# Patient Record
Sex: Male | Born: 1950
Health system: Southern US, Community
[De-identification: ages and names within clinical notes are randomized; demographics above are authoritative.]

## PROBLEM LIST (undated history)

## (undated) DIAGNOSIS — E669 Obesity, unspecified: Secondary | ICD-10-CM

## (undated) DIAGNOSIS — E785 Hyperlipidemia, unspecified: Secondary | ICD-10-CM

## (undated) DIAGNOSIS — K649 Unspecified hemorrhoids: Secondary | ICD-10-CM

## (undated) DIAGNOSIS — K219 Gastro-esophageal reflux disease without esophagitis: Secondary | ICD-10-CM

## (undated) DIAGNOSIS — C61 Malignant neoplasm of prostate: Secondary | ICD-10-CM

## (undated) DIAGNOSIS — E559 Vitamin D deficiency, unspecified: Secondary | ICD-10-CM

## (undated) DIAGNOSIS — I10 Essential (primary) hypertension: Secondary | ICD-10-CM

## (undated) DIAGNOSIS — I739 Peripheral vascular disease, unspecified: Secondary | ICD-10-CM

## (undated) DIAGNOSIS — Z8619 Personal history of other infectious and parasitic diseases: Secondary | ICD-10-CM

## (undated) HISTORY — DX: Unspecified hemorrhoids: K64.9

## (undated) HISTORY — DX: Gastro-esophageal reflux disease without esophagitis: K21.9

## (undated) HISTORY — DX: Personal history of other infectious and parasitic diseases: Z86.19

## (undated) HISTORY — DX: Vitamin D deficiency, unspecified: E55.9

## (undated) HISTORY — DX: Essential (primary) hypertension: I10

## (undated) HISTORY — DX: Hyperlipidemia, unspecified: E78.5

## (undated) HISTORY — PX: TOTAL HIP ARTHROPLASTY: SHX124

## (undated) HISTORY — DX: Obesity, unspecified: E66.9

## (undated) HISTORY — PX: HEMORRHOID SURGERY: SHX153

## (undated) HISTORY — PX: TONSILLECTOMY: SUR1361

---

## 2012-07-31 ENCOUNTER — Encounter: Payer: Self-pay | Admitting: Gastroenterology

## 2012-08-08 ENCOUNTER — Encounter: Payer: Self-pay | Admitting: Gastroenterology

## 2012-08-08 ENCOUNTER — Ambulatory Visit (INDEPENDENT_AMBULATORY_CARE_PROVIDER_SITE_OTHER): Payer: BC Managed Care – PPO | Admitting: Gastroenterology

## 2012-08-08 VITALS — BP 140/60 | HR 72 | Ht 70.0 in | Wt 257.0 lb

## 2012-08-08 DIAGNOSIS — R634 Abnormal weight loss: Secondary | ICD-10-CM

## 2012-08-08 DIAGNOSIS — Z1211 Encounter for screening for malignant neoplasm of colon: Secondary | ICD-10-CM

## 2012-08-08 DIAGNOSIS — R110 Nausea: Secondary | ICD-10-CM

## 2012-08-08 MED ORDER — MOVIPREP 100 G PO SOLR: 1.0000 | Freq: Once | ORAL | Status: DC

## 2012-08-08 NOTE — Patient Instructions (Addendum)
You will be set up for an upper endoscopy for nausea, weight loss. You will be set up for a colonoscopy for routine colon cancer screening (LEC, moderate sedation). For now, continue sucralfate.                                              We are excited to introduce MyChart, a new best-in-class service that provides you online access to important information in your electronic medical record. We want to make it easier for you to view your health information - all in one secure location - when and where you need it. We expect MyChart will enhance the quality of care and service we provide.  When you register for MyChart, you can:    View your test results.    Request appointments and receive appointment reminders via email.    Request medication renewals.    View your medical history, allergies, medications and immunizations.    Communicate with your physician's office through a password-protected site.    Conveniently print information such as your medication lists.  To find out if MyChart is right for you, please talk to a member of our clinical staff today. We will gladly answer your questions about this free health and wellness tool.  If you are age 55 or older and want a member of your family to have access to your record, you must provide written consent by completing a proxy form available at our office. Please speak to our clinical staff about guidelines regarding accounts for patients younger than age 33.  As you activate your MyChart account and need any technical assistance, please call the MyChart technical support line at [phone]) or email your question to [email or web address]. If you email your question(s), please include your name, a return phone number and the best time to reach you.  If you have non-urgent health-related questions, you can send a message to our office through MyChart at [email or web address]. If you have a medical emergency, call  [phone].  Thank you for using MyChart as your new health and wellness resource!   MyChart licensed from [employer],  [id number]. Patents Pending.

## 2012-08-08 NOTE — Progress Notes (Signed)
HPI: This is a    very pleasant 62 year old man whom I am meeting for the first time today.  Has lost 20 pounds in past several weeks.   This "goes back 50 years."  During times of terrible stress, tragedy he has a "nervous."  Episode of GI upset with death of father.  This past fall he was treated for H. Pylori.  Since January has been very stressed about ? Retirement.    3 weeks ago woke up very nauseas, did not vomit.  Went to PCP and was told to stop omeprazole and instead start protonix once daily and reglan tid. He stayed nauseas for 2 weeks.  Last week he stopped PPI, reglan and started carafate after all meals. This change has really helped and he has started eating normally again.  No abd pains.  20 pounds weight loss.  Vomited liquid, phlegm twice only.    Pretty rare NSAIDs  No dysphagia.  Never had EGD or colonoscopy. No FH of colon or gastric cancers.    Review of systems: Pertinent positive and negative review of systems were noted in the above HPI section. Complete review of systems was performed and was otherwise normal.    Past Medical History  Diagnosis Date  . HTN (hypertension)   . Hyperlipemia     pt denies 08/08/12  . GERD (gastroesophageal reflux disease)   . Obesity   . History of Helicobacter pylori infection   . Vitamin D deficiency   . Hemorrhoids     Past Surgical History  Procedure Laterality Date  . Hemorrhoid surgery      age 69  . Tonsillectomy      Current Outpatient Prescriptions  Medication Sig Dispense Refill  . bisoprolol-hydrochlorothiazide (ZIAC) 2.5-6.25 MG per tablet Take 1 tablet by mouth daily.      Marland Kitchen losartan (COZAAR) 100 MG tablet Take 50 mg by mouth daily.      . sucralfate (CARAFATE) 1 G tablet Take 1 g by mouth 3 (three) times daily after meals.       No current facility-administered medications for this visit.    Allergies as of 08/08/2012  . (No Known Allergies)    Family History  Problem Relation Age of  Onset  . Diabetes Father   . Heart disease Father     History   Social History  . Marital Status: Married    Spouse Name: N/A    Number of Children: 1  . Years of Education: N/A   Occupational History  . Technical brewer    Social History Main Topics  . Smoking status: Never Smoker   . Smokeless tobacco: Never Used  . Alcohol Use: No  . Drug Use: No  . Sexually Active: Not on file   Other Topics Concern  . Not on file   Social History Narrative  . No narrative on file       Physical Exam: Ht 5\' 10"  (1.778 m)  Wt 257 lb (116.574 kg)  BMI 36.88 kg/m2 Constitutional: generally well-appearing Psychiatric: alert and oriented x3 Eyes: extraocular movements intact Mouth: oral pharynx moist, no lesions Neck: supple no lymphadenopathy Cardiovascular: heart regular rate and rhythm Lungs: clear to auscultation bilaterally Abdomen: soft, nontender, nondistended, no obvious ascites, no peritoneal signs, normal bowel sounds Extremities: no lower extremity edema bilaterally Skin: no lesions on visible extremities    Assessment and plan: 62 y.o. male with  several weeks of nausea, anorexia, weight loss  He had H.  pylori infection in the past and perhaps that has not gone away. Perhaps this is just GERD, acid related. He does not take NSAIDs. I think EGD is the first step in evaluating his symptoms. He has agreed to same time colonoscopy for routine colon cancer screening. He is feeling much better since starting sucralfate he will stay on that for now. His symptoms have been pretty significant however and if he is not adequately explained and I think out proceed with further testing such as imaging tests.  I did not mention above that he did have recent CBC showed a very slightly elevated white count at 11,000, normal hemoglobin, normal platelets. His complete metabolic profile is essentially normal.

## 2012-08-18 ENCOUNTER — Encounter: Payer: BC Managed Care – PPO | Admitting: Gastroenterology

## 2012-10-10 ENCOUNTER — Encounter: Payer: BC Managed Care – PPO | Admitting: Gastroenterology

## 2012-10-29 ENCOUNTER — Other Ambulatory Visit: Payer: Self-pay | Admitting: Internal Medicine

## 2012-10-29 DIAGNOSIS — R109 Unspecified abdominal pain: Secondary | ICD-10-CM

## 2012-10-31 ENCOUNTER — Ambulatory Visit
Admission: RE | Admit: 2012-10-31 | Discharge: 2012-10-31 | Disposition: A | Discharge status: Home / Self Care | Payer: BC Managed Care – PPO | Source: Ambulatory Visit | Attending: Internal Medicine | Admitting: Internal Medicine

## 2012-10-31 DIAGNOSIS — R109 Unspecified abdominal pain: Secondary | ICD-10-CM

## 2012-10-31 MED ORDER — IOHEXOL 300 MG/ML  SOLN
125.0000 mL | Freq: Once | INTRAMUSCULAR | Status: AC | PRN
  Administered 2012-10-31: 125 mL via INTRAVENOUS

## 2012-11-03 DIAGNOSIS — I739 Peripheral vascular disease, unspecified: Secondary | ICD-10-CM

## 2012-11-03 HISTORY — DX: Peripheral vascular disease, unspecified: I73.9

## 2012-11-20 ENCOUNTER — Ambulatory Visit
Admission: RE | Admit: 2012-11-20 | Discharge: 2012-11-20 | Disposition: A | Discharge status: Home / Self Care | Payer: BC Managed Care – PPO | Source: Ambulatory Visit | Attending: Internal Medicine | Admitting: Internal Medicine

## 2012-11-20 ENCOUNTER — Other Ambulatory Visit: Payer: Self-pay | Admitting: Internal Medicine

## 2012-11-20 DIAGNOSIS — M79605 Pain in left leg: Secondary | ICD-10-CM

## 2012-12-12 ENCOUNTER — Encounter: Payer: Self-pay | Admitting: Physician Assistant

## 2012-12-15 ENCOUNTER — Other Ambulatory Visit: Payer: Self-pay | Admitting: Urology

## 2012-12-29 ENCOUNTER — Encounter (HOSPITAL_COMMUNITY): Payer: Self-pay | Admitting: Pharmacy Technician

## 2012-12-30 NOTE — Patient Instructions (Addendum)
20 Kiyoshi Schaab  12/30/2012   Your procedure is scheduled on:  01/02/13  Report to [facility] at   0530    AM.  Call this number if you have problems the morning of surgery: [phone]       Remember:   Do not eat food  Or drink :After Midnight. Thursday NIGHT   Take these medicines the morning of surgery with A SIP OF WATER: OMEPRAZOLE   .  Contacts, dentures or partial plates can not be worn to surgery  Leave suitcase in the car. After surgery it may be brought to your room.  For patients admitted to the hospital, checkout time is 11:00 AM day of  discharge.             SPECIAL INSTRUCTIONS- SEE  PREPARING FOR SURGERY INSTRUCTION SHEET-     DO NOT WEAR JEWELRY, LOTIONS, POWDERS, OR PERFUMES.  WOMEN-- DO NOT SHAVE LEGS OR UNDERARMS FOR 12 HOURS BEFORE SHOWERS. MEN MAY SHAVE FACE.  Patients discharged the day of surgery will not be allowed to drive home. IF going home the day of surgery, you must have a driver and someone to stay with you for the first 24 hours  Name and phone number of your driver:    Wynona Canes   wife                                                                      Deliah Goody  PST [phone]                 FAILURE TO FOLLOW THESE INSTRUCTIONS MAY RESULT IN  CANCELLATION   OF YOUR SURGERY                                                  Patient Signature _____________________________

## 2012-12-31 ENCOUNTER — Ambulatory Visit (HOSPITAL_COMMUNITY)
Admission: RE | Admit: 2012-12-31 | Discharge: 2012-12-31 | Disposition: A | Discharge status: Home / Self Care | Payer: BC Managed Care – PPO | Source: Ambulatory Visit | Attending: Urology | Admitting: Urology

## 2012-12-31 ENCOUNTER — Encounter (HOSPITAL_COMMUNITY): Payer: Self-pay

## 2012-12-31 ENCOUNTER — Encounter (HOSPITAL_COMMUNITY)
Admission: RE | Admit: 2012-12-31 | Discharge: 2012-12-31 | Disposition: A | Discharge status: Home / Self Care | Payer: BC Managed Care – PPO | Source: Ambulatory Visit | Attending: Orthopaedic Surgery | Admitting: Orthopaedic Surgery

## 2012-12-31 DIAGNOSIS — Z01812 Encounter for preprocedural laboratory examination: Secondary | ICD-10-CM | POA: Insufficient documentation

## 2012-12-31 DIAGNOSIS — N2889 Other specified disorders of kidney and ureter: Secondary | ICD-10-CM | POA: Insufficient documentation

## 2012-12-31 DIAGNOSIS — Z01818 Encounter for other preprocedural examination: Secondary | ICD-10-CM | POA: Insufficient documentation

## 2012-12-31 HISTORY — DX: Peripheral vascular disease, unspecified: I73.9

## 2012-12-31 LAB — BASIC METABOLIC PANEL
Calcium: 9.9 mg/dL (ref 8.4–10.5)
Creatinine, Ser: 1.01 mg/dL (ref 0.50–1.35)
GFR calc Af Amer: 89 mL/min — ABNORMAL LOW (ref >90)
Sodium: 134 mEq/L — ABNORMAL LOW (ref 135–145)

## 2012-12-31 LAB — CBC
MCH: 31 pg (ref 26.0–34.0)
MCV: 90.4 fL (ref 78.0–100.0)
Platelets: 231 10*3/uL (ref 150–400)
RDW: 13.6 % (ref 11.5–15.5)
WBC: 7.5 10*3/uL (ref 4.0–10.5)

## 2013-01-01 NOTE — Anesthesia Preprocedure Evaluation (Addendum)
Anesthesia Evaluation  Patient identified by MRN, date of birth, ID band Patient awake    Reviewed: Allergy & Precautions, H&P , NPO status , Patient's Chart, lab work & pertinent test results  Airway Mallampati: II TM Distance: >3 FB Neck ROM: Full    Dental  (+) Teeth Intact and Dental Advisory Given   Pulmonary neg pulmonary ROS,  breath sounds clear to auscultation  Pulmonary exam normal       Cardiovascular hypertension, Pt. on medications + Peripheral Vascular Disease and DVT Rhythm:Regular Rate:Normal     Neuro/Psych negative neurological ROS  negative psych ROS   GI/Hepatic Neg liver ROS, GERD-  Medicated,  Endo/Other  negative endocrine ROS  Renal/GU negative Renal ROS  negative genitourinary   Musculoskeletal negative musculoskeletal ROS (+)   Abdominal   Peds  Hematology negative hematology ROS (+)   Anesthesia Other Findings   Reproductive/Obstetrics                          Anesthesia Physical Anesthesia Plan  ASA: II  Anesthesia Plan: General   Post-op Pain Management:    Induction: Intravenous  Airway Management Planned: LMA  Additional Equipment:   Intra-op Plan:   Post-operative Plan: Extubation in OR  Informed Consent: I have reviewed the patients History and Physical, chart, labs and discussed the procedure including the risks, benefits and alternatives for the proposed anesthesia with the patient or authorized representative who has indicated his/her understanding and acceptance.   Dental advisory given  Plan Discussed with: CRNA  Anesthesia Plan Comments:         Anesthesia Quick Evaluation

## 2013-01-02 ENCOUNTER — Ambulatory Visit (HOSPITAL_COMMUNITY): Payer: BC Managed Care – PPO | Admitting: Anesthesiology

## 2013-01-02 ENCOUNTER — Encounter (HOSPITAL_COMMUNITY): Payer: Self-pay | Admitting: *Deleted

## 2013-01-02 ENCOUNTER — Encounter (HOSPITAL_COMMUNITY)
Admission: RE | Disposition: A | Discharge status: Home / Self Care | Payer: Self-pay | Source: Ambulatory Visit | Attending: Urology

## 2013-01-02 ENCOUNTER — Ambulatory Visit (HOSPITAL_COMMUNITY)
Admission: RE | Admit: 2013-01-02 | Discharge: 2013-01-02 | Disposition: A | Discharge status: Home / Self Care | Payer: BC Managed Care – PPO | Source: Ambulatory Visit | Attending: Urology | Admitting: Urology

## 2013-01-02 ENCOUNTER — Encounter (HOSPITAL_COMMUNITY): Payer: BC Managed Care – PPO | Admitting: Anesthesiology

## 2013-01-02 DIAGNOSIS — M129 Arthropathy, unspecified: Secondary | ICD-10-CM | POA: Insufficient documentation

## 2013-01-02 DIAGNOSIS — Z86718 Personal history of other venous thrombosis and embolism: Secondary | ICD-10-CM | POA: Insufficient documentation

## 2013-01-02 DIAGNOSIS — R9389 Abnormal findings on diagnostic imaging of other specified body structures: Secondary | ICD-10-CM | POA: Insufficient documentation

## 2013-01-02 DIAGNOSIS — N289 Disorder of kidney and ureter, unspecified: Secondary | ICD-10-CM | POA: Insufficient documentation

## 2013-01-02 DIAGNOSIS — K219 Gastro-esophageal reflux disease without esophagitis: Secondary | ICD-10-CM | POA: Insufficient documentation

## 2013-01-02 DIAGNOSIS — N2889 Other specified disorders of kidney and ureter: Secondary | ICD-10-CM | POA: Insufficient documentation

## 2013-01-02 DIAGNOSIS — R972 Elevated prostate specific antigen [PSA]: Secondary | ICD-10-CM | POA: Insufficient documentation

## 2013-01-02 DIAGNOSIS — I10 Essential (primary) hypertension: Secondary | ICD-10-CM | POA: Insufficient documentation

## 2013-01-02 DIAGNOSIS — R3129 Other microscopic hematuria: Secondary | ICD-10-CM | POA: Insufficient documentation

## 2013-01-02 DIAGNOSIS — N200 Calculus of kidney: Secondary | ICD-10-CM | POA: Insufficient documentation

## 2013-01-02 HISTORY — PX: CYSTOSCOPY/RETROGRADE/URETEROSCOPY: SHX5316

## 2013-01-02 SURGERY — CYSTOSCOPY/RETROGRADE/URETEROSCOPY
Anesthesia: General | Laterality: Left | Wound class: Clean Contaminated

## 2013-01-02 MED ORDER — ONDANSETRON HCL 4 MG/2ML IJ SOLN
INTRAMUSCULAR | Status: DC | PRN
  Administered 2013-01-02: 4 mg via INTRAVENOUS

## 2013-01-02 MED ORDER — CIPROFLOXACIN IN D5W 400 MG/200ML IV SOLN
INTRAVENOUS | Status: AC
  Filled 2013-01-02: qty 200

## 2013-01-02 MED ORDER — PROMETHAZINE HCL 25 MG/ML IJ SOLN: 6.2500 mg | INTRAMUSCULAR | Status: DC | PRN

## 2013-01-02 MED ORDER — FENTANYL CITRATE 0.05 MG/ML IJ SOLN
INTRAMUSCULAR | Status: DC | PRN
  Administered 2013-01-02 (×2): 50 ug via INTRAVENOUS
  Administered 2013-01-02: 100 ug via INTRAVENOUS
  Administered 2013-01-02: 50 ug via INTRAVENOUS

## 2013-01-02 MED ORDER — PROPOFOL 10 MG/ML IV BOLUS
INTRAVENOUS | Status: DC | PRN
  Administered 2013-01-02: 200 mg via INTRAVENOUS

## 2013-01-02 MED ORDER — CIPROFLOXACIN IN D5W 400 MG/200ML IV SOLN
400.0000 mg | INTRAVENOUS | Status: AC
  Administered 2013-01-02: 400 mg via INTRAVENOUS

## 2013-01-02 MED ORDER — MIDAZOLAM HCL 5 MG/5ML IJ SOLN
INTRAMUSCULAR | Status: DC | PRN
  Administered 2013-01-02: 2 mg via INTRAVENOUS

## 2013-01-02 MED ORDER — ACETAMINOPHEN-CODEINE #3 300-30 MG PO TABS: 1.0000 | ORAL_TABLET | ORAL | Status: DC | PRN

## 2013-01-02 MED ORDER — IOHEXOL 300 MG/ML  SOLN
INTRAMUSCULAR | Status: DC | PRN
  Administered 2013-01-02: 20 mL

## 2013-01-02 MED ORDER — BELLADONNA ALKALOIDS-OPIUM 16.2-60 MG RE SUPP
RECTAL | Status: AC
  Filled 2013-01-02: qty 1

## 2013-01-02 MED ORDER — LACTATED RINGERS IV SOLN
INTRAVENOUS | Status: DC
  Administered 2013-01-02: 1000 mL via INTRAVENOUS

## 2013-01-02 MED ORDER — LIDOCAINE HCL 2 % EX GEL
CUTANEOUS | Status: AC
  Filled 2013-01-02: qty 10

## 2013-01-02 MED ORDER — LACTATED RINGERS IV SOLN
INTRAVENOUS | Status: DC | PRN
  Administered 2013-01-02: 07:00:00 via INTRAVENOUS

## 2013-01-02 MED ORDER — LIDOCAINE HCL 2 % EX GEL
CUTANEOUS | Status: DC | PRN
  Administered 2013-01-02: 1 via URETHRAL

## 2013-01-02 MED ORDER — LIDOCAINE HCL 1 % IJ SOLN
INTRAMUSCULAR | Status: DC | PRN
  Administered 2013-01-02: 50 mg via INTRADERMAL

## 2013-01-02 MED ORDER — CIPROFLOXACIN HCL 500 MG PO TABS: 500.0000 mg | ORAL_TABLET | Freq: Two times a day (BID) | ORAL | Status: DC

## 2013-01-02 MED ORDER — BISOPROLOL FUMARATE 10 MG PO TABS
10.0000 mg | ORAL_TABLET | Freq: Once | ORAL | Status: AC
  Administered 2013-01-02: 10 mg via ORAL
  Filled 2013-01-02: qty 1

## 2013-01-02 MED ORDER — TROSPIUM CHLORIDE ER 60 MG PO CP24: 60.0000 mg | ORAL_CAPSULE | Freq: Every day | ORAL | Status: DC

## 2013-01-02 MED ORDER — HYDROMORPHONE HCL PF 1 MG/ML IJ SOLN: 0.2500 mg | INTRAMUSCULAR | Status: DC | PRN

## 2013-01-02 MED ORDER — SODIUM CHLORIDE 0.9 % IR SOLN
Status: DC | PRN
  Administered 2013-01-02: 3000 mL via INTRAVESICAL

## 2013-01-02 SURGICAL SUPPLY — 19 items
BAG URO CATCHER STRL LF (DRAPE) ×2 IMPLANT
BENZOIN TINCTURE PRP APPL 2/3 (GAUZE/BANDAGES/DRESSINGS) ×2 IMPLANT
CATH URET 5FR 28IN CONE TIP (BALLOONS)
CATH URET 5FR 28IN OPEN ENDED (CATHETERS) ×2 IMPLANT
CATH URET 5FR 70CM CONE TIP (BALLOONS) IMPLANT
CLOTH BEACON ORANGE TIMEOUT ST (SAFETY) ×2 IMPLANT
CONT SPEC 4OZ CLIKSEAL STRL BL (MISCELLANEOUS) ×2 IMPLANT
DRAPE CAMERA CLOSED 9X96 (DRAPES) ×2 IMPLANT
DRSG TEGADERM 2-3/8X2-3/4 SM (GAUZE/BANDAGES/DRESSINGS) ×2 IMPLANT
GLOVE BIOGEL M 8.0 STRL (GLOVE) ×2 IMPLANT
GOWN PREVENTION PLUS XLARGE (GOWN DISPOSABLE) ×2 IMPLANT
GOWN STRL REIN XL XLG (GOWN DISPOSABLE) ×2 IMPLANT
GUIDEWIRE STR DUAL SENSOR (WIRE) ×4 IMPLANT
MANIFOLD NEPTUNE II (INSTRUMENTS) ×2 IMPLANT
PACK CYSTO (CUSTOM PROCEDURE TRAY) ×2 IMPLANT
STENT CONTOUR 6FRX26X.038 (STENTS) ×2 IMPLANT
SYR CONTROL 10ML LL (SYRINGE) ×2 IMPLANT
TUBING CONNECTING 10 (TUBING) ×2 IMPLANT
WIRE COONS/BENSON .038X145CM (WIRE) IMPLANT

## 2013-01-02 NOTE — H&P (Signed)
History of Present Illness  This is a 62 year old male patient referred by Dr. Lucky Cowboy, M.D. for evaluation of urothelial mass in the left lower pole.  The patient had a CT scan obtained with IV contrast approximally 3 weeks ago for diffuse abdominal pain and a history of diverticulitis.  Incidental note of the 12 mm left lower pole stone with a suspicious area in the lower pole with concern for this being an upper tract TCC.  Patient denies any gross hematuria.  Denies any weight loss, fevers or chills, night sweats.  Patient has had no flank pain.  He has a family history of kidney stones - father had a large stone that was treated. Prior to his bout with diverticulitis he was very healthy.    In addition the patient was recently diagnosed with a left lower extremity DVT for which he was started on Xarelto. Patient noted left leg was swollen after working in the yard.  Asymptomatic - occasionally he has pain when he compresses the back of his leg.  The patient also recently had a PSA test.  His PSA was 3.39,. 8% free.   One year prior the patient had a PSA of 1.22.  Patient's mom had low grade bladder TCC, died of CHF.  In addition, the patient has no family history of prostate cancer.  On 11/20/12: UA was negative for hematuria BUN 12/creatinine 0.95  On 10/31/12 the patient underwent abdomen and pelvis CT scan with contrast: I have independently reviewed these images.   Past Medical History Problems  1. History of  Arthritis V13.4 2. History of  Esophageal Reflux 530.81 3. History of  Hypertension 401.9  Surgical History Problems  1. History of  No Surgical Problems  Current Meds 1. Bisoprolol-Hydrochlorothiazide TABS; Therapy: (Recorded:24Sep2014) to 2. Losartan Potassium 100 MG Oral Tablet; Therapy: (Recorded:24Sep2014) to 3. Protonix 40 MG Intravenous Solution Reconstituted; Therapy: (Recorded:24Sep2014) to  Allergies Medication  1. No Known Drug Allergies  Family  History Problems  1. Paternal history of  Acute Myocardial Infarction V17.3 2. Maternal history of  Bladder Cancer V16.52 3. Maternal history of  Congestive Heart Failure  Social History Problems    Caffeine Use   Marital History - Currently Married   Never A Smoker Denied    History of  Alcohol Use  Review of Systems  A 12 point comprehensive review of systems was obtained and is negative unless otherwise stated in the HPI with the following additions: Indigestion/heartburn, leg swelling, and joint pain.     Vitals Vital Signs [Data Includes: Last 1 Day]  25Sep2014 02:31PM  BMI Calculated: 35.84 BSA Calculated: 2.34 Height: 5 ft 11 in Weight: 256 lb  Blood Pressure: 129 / 72 Temperature: 98.2 F Heart Rate: 78  Physical Exam Constitutional: Well nourished and well developed . No acute distress.  ENT:. The ears and nose are normal in appearance.  Neck: The appearance of the neck is normal and no neck mass is present.  Pulmonary: No respiratory distress and normal respiratory rhythm and effort.  Cardiovascular: Heart rate and rhythm are normal . No peripheral edema.  Abdomen: The abdomen is soft and nontender. No masses are palpated. No CVA tenderness. No hernias are palpable. No hepatosplenomegaly noted.  Rectal: Rectal exam demonstrates rectal tenderness, but normal sphincter tone and no masses. Estimated prostate size is 1+. Normal rectal tone, no rectal masses, prostate is smooth, symmetric and non-tender. The prostate has no nodularity and is not tender. The left seminal vesicle is  nonpalpable. The right seminal vesicle is nonpalpable. The perineum is normal on inspection.  Genitourinary: Examination of the penis demonstrates no discharge, no masses, no lesions and a normal meatus. The scrotum is without lesions. The right epididymis is palpably normal and non-tender. The left epididymis is palpably normal and non-tender. The right testis is non-tender and without masses.  The left testis is non-tender and without masses.  Lymphatics: The femoral and inguinal nodes are not enlarged or tender.  Skin: Normal skin turgor, no visible rash and no visible skin lesions.  Neuro/Psych:. Mood and affect are appropriate.    Results/Data Urine [Data Includes: Last 1 Day]   25Sep2014  COLOR YELLOW   APPEARANCE CLEAR   SPECIFIC GRAVITY 1.025   pH 5.5   GLUCOSE NEG mg/dL  BILIRUBIN NEG   KETONE NEG mg/dL  BLOOD LARGE   PROTEIN NEG mg/dL  UROBILINOGEN 0.2 mg/dL  NITRITE NEG   LEUKOCYTE ESTERASE NEG   SQUAMOUS EPITHELIAL/HPF MODERATE   WBC NONE SEEN WBC/hpf  RBC 11-20 RBC/hpf  BACTERIA NONE SEEN   CRYSTALS NONE SEEN   CASTS NONE SEEN     CT abdomen and pelvis with contrast from 11/21/12: I have personally reviewed these images which show a 12 x 12 mm stone in the left lower pole.  In addition the patient has some stranding and soft tissue thickening at the left UPJ.  No delayed images were obtained to further delineate this area.   Assessment  #1 abnormal thickening of the left UPJ #2 12 mm left lower pole kidney stone #3 rapid rise in PSA with percent free concerning for prostate cancer #4 left lower extremity DVT on Xarelto     Discussion/Summary We'll start with a diagnostic cystoscopy, left ureteral washing, left retrograde pyelogram, and possible left ureteral biopsy to rule out an upper tract TCC. At the time of the patient's preoperative evaluation for anesthesia I will also obtain a repeat PSA.  We will wait and treatment for the kidney stone until the above issues are sorted out.

## 2013-01-02 NOTE — OR Nursing (Signed)
After anesthesia induction it was noticed that the patient did not have his stocking on as previously stated. It was decided by dr. Marlou Porch to do the surgery without since the surgical time was to be short. Once surgery is over, ted hose will be applied to the patients' lower legs.

## 2013-01-02 NOTE — OR Nursing (Signed)
Patient has recent hx of DVT. i had a discussion about this with dr. Rica Mast and dr. Marlou Porch. It was decided it was safer NOT to use scd's for this patient. He does have a compression stocking on the affected leg.

## 2013-01-02 NOTE — Anesthesia Postprocedure Evaluation (Signed)
Anesthesia Post Note  Patient: Jason Patton  Procedure(s) Performed: Procedure(s) (LRB): CYSTOSCOPY/LEFT URETERAL WASHINGS/LEFT RETROGRADE PYELOGRAM/ LEFT URETEROSCOPY/URETERAL BIOPSY. BLADDER BIOPSY (Left)  Anesthesia type: General  Patient location: PACU  Post pain: Pain level controlled  Post assessment: Post-op Vital signs reviewed  Last Vitals:  Filed Vitals:   01/02/13 1001  BP: 134/86  Pulse:   Temp: 36.1 C  Resp: 12    Post vital signs: Reviewed  Level of consciousness: sedated  Complications: No apparent anesthesia complications

## 2013-01-02 NOTE — Op Note (Signed)
Preoperative diagnosis: Microscopic hematuria, left renal pelvis thickening on CT scan, left lower pole 12 mm renal stone  Postoperative diagnosis: As above, small external abnormality the left bladder trigone.  Procedure Performed: Cystoscopy, bladder biopsy and fulguration, left renal pelvis selective cytology, left retrograde pyelogram, left ureteroscopy.  Surgeon: Dr. Crist Fat, M.D.  Findings: 1. Cystoscopy revealed a small abnormality of the bladder mucosa around the left ureteral orifice, this area was biopsied. 2. No filling defects noted in the left renal pelvis, there was a filling defect noted in the lower pole consistent  with lower pole stone noted on CT scan 3. The left UPJ demonstrated no significant mucosal abnormality, and no biopsy was obtained. 4. A 26 cm x 6 French stent was placed in the left ureter under fluoroscopic guidance.  Indications: [name] is a [age] gentleman who presented to me with a left renal pelvis/UPJ thickening noted on a CT scan which was performed for diverticulitis. He was also diagnosed with a 12 millimeter left lower pole stone, and the patient had an elevated PSA 3. 5. Simultaneously the patient was also diagnosed with a left lower cavity DVT. We discussed the options for further evaluation, and I recommended the patient undergo left retrograde pyelogram left selective cytology and possible left ureteral biopsy as the initial diagnostic study. I also repeat his PSA which was 2.57. He understands the risks and benefits of the operation and is agreed proceed.  Procedure: Consent was obtained in the preoperative holding area. The patient was then brought back to the operating room placed on the table in supine position. General anesthesia was induced and endotracheal tube was inserted. He was then placed in the dorsal lithotomy position and prepped and draped in the routine sterile fashion. A timeout was held.  A 22 French 30 cystoscope was  then gently passed to the patient's urethra and into the bladder. The bladder was then emptied and the 30 lens was exchanged for the 70 lens. A 360 cystoscopic evaluation was performed with the above findings. I then reinserted the 30 lens and performed a left renal pelvis selective cytology by inserting a 5 French open-ended ureteral Pollock into the left ureteral orifice and under fluoroscopic guidance gently passed it up the ureter and into the area of the left renal pelvis. I then inserted approximately 10 cc of normal saline through the Gurley and allowed the return urine to drip into a specimen cup which was then sent for cytology. We then performed a retrograde pyelogram with the above findings. I then placed a 0.38 sensor wire through the open-ended ureteral Pollock and into the renal pelvis. I then emptied the bladder and removed the cystoscope. We then passed the semirigid ureteroscope gently to the patient's urethra and into the bladder under visual guidance. Using a second 0.38 sensor wire I was able to L Rd. the scope through the 2 wires and into the left ureteral orifice. I then gently navigated the left ureter under visual guidance up to the renal pelvis with the rigid scope, with the above findings. I then passed a second 0.38 wire through the semirigid ureteroscope slowly backed ureteroscope of the ureter over the wire. I then inserted the flexible ureteroscope over the wire and into the left renal pelvis under visual guidance. Then aspirated the murky urine from the left renal pelvis and filling fresh normal saline was able to visualize the entire collecting system. There were no significant abnormalities except for the large stone noted in  the left lower pole. I then attempted to take a biopsy of the left renal pelvis/UPJ area although this proved difficult because there were no raised or suspicious areas. The biopsy specimens turned out to be in significant, and were not sent to  pathology.  Having removed the flexible ureteroscope, I then passed the open-ended ureteral Pollock over the safety wire removed the wire and performed a second retrograde pyelogram to help identify landmarks for the placement of the left ureteral stent. We then passed the 0.38 sensor wire back through the open-ended Pollack and remove the [place] Number One over the wire. I then backloaded the wire into the cystoscope and gently passed the scope through the patient's urethra and into the bladder over the wire. We then inserted the 6 [place] x26 cm double-J stent over the wire and into the left ureter. A curl was noted under fluoroscopic guidance to be in the upper pole of the left renal pelvis. Once the distal end of  The stent was at the bladder neck the wire was removed and the stent was noted to be nicely curled within the bladder. The string was left on which at the end of the case was placed on the ventrum of the penis and secured with benzoin and Tegaderm. The scope was then repassed through the urethra and a small biopsy was obtained from the left ureteral orifice, the area noted to be abnormal on the initial cystoscopic evaluation. This area was then copiously fulgurated all bleeding stopped. The bladder was then emptied, and viscous lidocaine jelly was inserted into the patient's ureter. The patient was a subsequently extubated and returned to the PACU in excellent condition.  At the end of the case all instruments and sponges had been accounted for. There are no perioperative complications.

## 2013-01-02 NOTE — Transfer of Care (Signed)
Immediate Anesthesia Transfer of Care Note  Patient: Wendie Agreste  Procedure(s) Performed: Procedure(s): CYSTOSCOPY/LEFT URETERAL WASHINGS/LEFT RETROGRADE PYELOGRAM/ LEFT URETEROSCOPY/URETERAL BIOPSY. BLADDER BIOPSY (Left)  Patient Location: PACU  Anesthesia Type:General  Level of Consciousness: awake, alert , oriented and patient cooperative  Airway & Oxygen Therapy: Patient Spontanous Breathing and Patient connected to face mask oxygen  Post-op Assessment: Report given to PACU RN, Post -op Vital signs reviewed and stable and Patient moving all extremities  Post vital signs: Reviewed and stable  Complications: No apparent anesthesia complications

## 2013-01-05 ENCOUNTER — Encounter (HOSPITAL_COMMUNITY): Payer: Self-pay | Admitting: Urology

## 2013-02-18 ENCOUNTER — Encounter: Payer: Self-pay | Admitting: Physician Assistant

## 2013-02-18 ENCOUNTER — Ambulatory Visit (INDEPENDENT_AMBULATORY_CARE_PROVIDER_SITE_OTHER): Payer: BC Managed Care – PPO | Admitting: Physician Assistant

## 2013-02-18 VITALS — BP 122/80 | HR 72 | Temp 98.7°F | Resp 16 | Ht 71.0 in | Wt 245.0 lb

## 2013-02-18 DIAGNOSIS — Z79899 Other long term (current) drug therapy: Secondary | ICD-10-CM

## 2013-02-18 DIAGNOSIS — E559 Vitamin D deficiency, unspecified: Secondary | ICD-10-CM

## 2013-02-18 DIAGNOSIS — R7303 Prediabetes: Secondary | ICD-10-CM

## 2013-02-18 DIAGNOSIS — R7309 Other abnormal glucose: Secondary | ICD-10-CM

## 2013-02-18 DIAGNOSIS — E782 Mixed hyperlipidemia: Secondary | ICD-10-CM | POA: Insufficient documentation

## 2013-02-18 DIAGNOSIS — E785 Hyperlipidemia, unspecified: Secondary | ICD-10-CM

## 2013-02-18 DIAGNOSIS — I10 Essential (primary) hypertension: Secondary | ICD-10-CM | POA: Insufficient documentation

## 2013-02-18 LAB — CBC WITH DIFFERENTIAL/PLATELET
Basophils Absolute: 0 10*3/uL (ref 0.0–0.1)
Basophils Relative: 1 % (ref 0–1)
Hemoglobin: 15.9 g/dL (ref 13.0–17.0)
Lymphs Abs: 2.1 10*3/uL (ref 0.7–4.0)
MCHC: 35.2 g/dL (ref 30.0–36.0)
Monocytes Relative: 18 % — ABNORMAL HIGH (ref 3–12)
Neutro Abs: 3.1 10*3/uL (ref 1.7–7.7)
Neutrophils Relative %: 45 % (ref 43–77)
Platelets: 262 10*3/uL (ref 150–400)
RBC: 5.22 MIL/uL (ref 4.22–5.81)

## 2013-02-18 NOTE — Patient Instructions (Signed)
Bad carbs also include fruit juice, alcohol, and sweet tea. These are empty calories that do not signal to your brain that you are full.   Please remember the good carbs are still carbs which convert into sugar. So please measure them out no more than 1/2-1 cup of rice, oatmeal, pasta, and beans.  Veggies are however free foods! Pile them on.   I like lean protein at every meal such as chicken, [place], pork chops, cottage cheese, etc. Just do not fry these meats and please center your meal around vegetable, the meats should be a side dish.   No all fruit is created equal. Please see the list below, the fruit at the bottom is higher in sugars than the fruit at the top   ADD SUBLINGUAL B12!!!  Vitamin B12 Deficiency Not having enough vitamin B12 is called a deficiency. Vitamin B12 is an important vitamin. Your body needs vitamin B12 to:   Make red blood cells.  Make DNA. This is the genetic material inside all of your cells.  Help your nerves work properly so they can carry messages from your brain to your body. CAUSES  Not eating enough foods that contain vitamin B12.  Not having enough stomach acid and digestive juices. The body needs these to absorb vitamin B12 from the food you eat.  Having certain digestive system diseases that make it hard to absorb vitamin B12. These diseases include Crohn's disease, chronic pancreatitis, and cystic fibrosis.  Having pernicious anemia, which is a condition where the body has too few red blood cells. People with this condition do not make enough of a protein called "intrinsic factor," which is needed to absorb vitamin B12.  Having a surgery in which part of the stomach or small intestine is removed.  Taking certain medicines that make it hard for the body to absorb vitamin B12. These medicines include:  Heartburn medicine (antacids and proton pump inhibitors).  A certain antibiotic medicine called neomycin, which fights infection.  Some  medicines used to treat diabetes, tuberculosis, gout, and high cholesterol. RISK FACTORS Risk factors are things that make you more likely to develop a vitamin B12 deficiency. They include:  Being older than 50.  Being a vegetarian.  Being pregnant and a vegetarian or having a poor diet.  Taking certain drugs.  Being an alcoholic. SYMPTOMS You may have a vitamin B12 deficiency with no symptoms. However, a vitamin B12 deficiency can cause health problems like anemia and nerve damage. These health problems can lead to many possible symptoms, including:  Weakness.  Fatigue.  Loss of appetite.  Weight loss.  Numbness or tingling in your hands and feet.  Redness and burning of the tongue.  Confusion or memory problems.  Depression.  Dizziness.  Sensory problems, such as loss of taste, color blindness, and ringing in the ears.  Diarrhea or constipation.  Trouble walking. DIAGNOSIS Various types of tests can be given to help find the cause of your vitamin B12 deficiency. These tests include:  A complete blood count (CBC). This test gives your caregiver an overall picture of what makes up your blood.  A blood test to measure your B12 level.  A blood test to measure intrinsic factor.  An endoscopy. This procedure uses a thin tube with a camera on the end to look into your stomach or intestines. TREATMENT Treatment for vitamin B12 deficiency depends on what is causing it. Common options include:  Changing your eating and drinking habits, such as:  Eating more foods that contain vitamin B12.  Not drinking as much alcohol or any alcohol.  Taking vitamin B12 supplements. Your caregiver will tell you what dose is best for you.  Getting vitamin B12 injections. Some people get these a few times a week. Others get them once a month. HOME CARE INSTRUCTIONS  Take all supplements as directed by your caregiver. Follow the directions carefully.  Get any injections your  caregiver prescribes. Do not miss your appointments.  Eat lots of healthy foods that contain vitamin B12. Ask your caregiver if you should work with a nutritionist. Good things to include in your diet are:  Meat.  Poultry.  Fish.  Eggs.  Fortified cereal and dairy products. This means vitamin B12 has been added to the food. Check the label on the package to be sure.  Do not abuse alcohol.  Keep all follow-up appointments. Your caregiver will need to perform blood tests to make sure your vitamin B12 deficiency is going away. SEEK MEDICAL CARE IF:  You have any questions about your treatment.  Your symptoms come back. MAKE SURE YOU:  Understand these instructions.  Will watch your condition.  Will get help right away if you are not doing well or get worse. Document Released: 05/14/2011 Document Reviewed: 05/14/2011 [facility] Patient Information 2014 [place], [place].

## 2013-02-18 NOTE — Progress Notes (Signed)
HPI Patient presents for 3 month follow up with hypertension, hyperlipidemia, prediabetes and vitamin D. Patient's blood pressure has been controlled at home, today their BP is BP: 122/80 mmHg  Patient denies chest pain, shortness of breath, dizziness.  Patient's cholesterol is diet controlled.The cholesterol last visit was LDL 77.  B12 was 287.  The patient has been working on diet and exercise for prediabetes, and denies changes in vision, polys, and paresthesias. A1C 6.2.  Patent is on xarelto 20 mg daily for a DVT left leg on 10/21/12, after wearing compression stockings and elevating his states his leg is finally back to normal size. There is an unknown cause of why he had the DVT, negative AB CT, neg CXR, and PSA has gone back to normal. I am concerned that he has never had a colonoscopy so we discussed trying the new DNA colon cancer screening which it becomes available for [employer] in [place].  Patient is on Vitamin D supplement.  Vitamin D 99.  Current Medications:  Current Outpatient Prescriptions on File Prior to Visit  Medication Sig Dispense Refill  . bisoprolol-hydrochlorothiazide (ZIAC) 10-6.25 MG per tablet Take 1 tablet by mouth daily.      . Cholecalciferol (VITAMIN D3) 5000 UNITS CAPS Take 5,000 Units by mouth daily.      Marland Kitchen losartan (COZAAR) 100 MG tablet Take 50 mg by mouth daily.      Marland Kitchen omeprazole (PRILOSEC) 40 MG capsule Take 40 mg by mouth 2 (two) times daily.       No current facility-administered medications on file prior to visit.   Medical History:  Past Medical History  Diagnosis Date  . HTN (hypertension)   . Hyperlipemia     pt denies 08/08/12  . GERD (gastroesophageal reflux disease)   . Obesity   . History of Helicobacter pylori infection   . Vitamin D deficiency   . Hemorrhoids   . Peripheral vascular disease 9/14    DVT  left lower leg   Allergies:  Allergies  Allergen Reactions  . Ace Inhibitors   . Citalopram     ROS Constitutional: Denies fever,  chills, headaches, insomnia, fatigue, night sweats Eyes: Denies redness, blurred vision, diplopia, discharge, itchy, watery eyes.  ENT: Denies congestion, post nasal drip, sore throat, earache, dental pain, Tinnitus, Vertigo, Sinus pain, snoring.  Cardio: Denies chest pain, palpitations, irregular heartbeat, dyspnea, diaphoresis, orthopnea, PND, claudication, edema Respiratory: denies cough, shortness of breath, wheezing.  Gastrointestinal: Denies dysphagia, heartburn, AB pain/ cramps, N/V, diarrhea, constipation, hematemesis, melena, hematochezia,  hemorrhoids Genitourinary: Denies dysuria, frequency, urgency, nocturia, hesitancy, discharge, hematuria, flank pain Musculoskeletal: Denies myalgia, stiffness, pain, swelling and strain/sprain. Skin: Denies pruritis, rash, changing in skin lesion Neuro: Denies Weakness, tremor, incoordination, spasms, pain Psychiatric: Denies confusion, memory loss, sensory loss Endocrine: Denies change in weight, skin, hair change, nocturia Diabetic Polys, Denies visual blurring, hyper /hypo glycemic episodes, and paresthesia, Heme/Lymph: Denies Excessive bleeding, bruising, enlarged lymph nodes  Family history- Review and unchanged Social history- Review and unchanged Physical Exam: Filed Vitals:   02/18/13 1637  BP: 122/80  Pulse: 72  Temp: 98.7 F (37.1 C)  Resp: 16   Filed Weights   02/18/13 1637  Weight: 245 lb (111.131 kg)   General Appearance: Well nourished, in no apparent distress. Eyes: PERRLA, EOMs, conjunctiva no swelling or erythema Sinuses: No Frontal/maxillary tenderness ENT/Mouth: Ext aud canals clear, TMs without erythema, bulging. No erythema, swelling, or exudate on post pharynx.  Tonsils not swollen or erythematous. Hearing normal.  Neck: Supple, thyroid normal.  Respiratory: Respiratory effort normal, BS equal bilaterally without rales, rhonchi, wheezing or stridor.  Cardio: RRR with no MRGs. Brisk peripheral pulses without  edema.  Abdomen: Soft, + BS.  Non tender, no guarding, rebound, hernias, masses. Lymphatics: Non tender without lymphadenopathy.  Musculoskeletal: Full ROM, 5/5 strength, normal gait.  Skin: Warm, dry without rashes, lesions, ecchymosis. Right nose with non healing ulcer Neuro: Cranial nerves intact. Normal muscle tone, no cerebellar symptoms. Sensation intact.  Psych: Awake and oriented X 3, normal affect, Insight and Judgment appropriate.   Assessment and Plan:  Hypertension: Continue medication, monitor blood pressure at home.  Continue DASH diet. Cholesterol: Continue diet and exercise. Check cholesterol.  Pre-diabetes-Continue diet and exercise. Check A1C Vitamin D Def- check level and continue medications.  B12 Def- get on B12 and check next visit DVT- unknown cause- suggest getting colonoscopy and encouraged to go to [facility] for spot on right nose- last INR was 1.66- recheck.  Continue diet and meds as discussed. Further disposition pending results of labs.  Jason Patton 4:49 PM

## 2013-02-19 LAB — HEPATIC FUNCTION PANEL
ALT: 22 U/L (ref 0–53)
Bilirubin, Direct: 0.1 mg/dL (ref 0.0–0.3)
Indirect Bilirubin: 0.4 mg/dL (ref 0.0–0.9)
Total Bilirubin: 0.5 mg/dL (ref 0.3–1.2)

## 2013-02-19 LAB — BASIC METABOLIC PANEL WITH GFR
Chloride: 98 mEq/L (ref 96–112)
GFR, Est African American: 77 mL/min
GFR, Est Non African American: 67 mL/min
Glucose, Bld: 92 mg/dL (ref 70–99)
Potassium: 3.6 mEq/L (ref 3.5–5.3)
Sodium: 139 mEq/L (ref 135–145)

## 2013-02-19 LAB — PROTIME-INR
INR: 1.2 (ref <1.50)
Prothrombin Time: 15.1 seconds (ref 11.6–15.2)

## 2013-02-19 LAB — LIPID PANEL
Cholesterol: 147 mg/dL (ref 0–200)
HDL: 41 mg/dL (ref >39)
LDL Cholesterol: 92 mg/dL (ref 0–99)
Total CHOL/HDL Ratio: 3.6 Ratio
Triglycerides: 72 mg/dL (ref <150)
VLDL: 14 mg/dL (ref 0–40)

## 2013-02-19 LAB — TSH: TSH: 1.527 u[IU]/mL (ref 0.350–4.500)

## 2013-02-19 LAB — HEMOGLOBIN A1C: Mean Plasma Glucose: 126 mg/dL — ABNORMAL HIGH (ref <117)

## 2013-05-19 ENCOUNTER — Encounter: Payer: Self-pay | Admitting: Internal Medicine

## 2013-05-19 ENCOUNTER — Ambulatory Visit (INDEPENDENT_AMBULATORY_CARE_PROVIDER_SITE_OTHER): Payer: BC Managed Care – PPO | Admitting: Internal Medicine

## 2013-05-19 VITALS — BP 122/84 | HR 64 | Temp 98.1°F | Resp 18 | Ht 71.0 in | Wt 254.2 lb

## 2013-05-19 DIAGNOSIS — Z1211 Encounter for screening for malignant neoplasm of colon: Secondary | ICD-10-CM | POA: Insufficient documentation

## 2013-05-19 DIAGNOSIS — E785 Hyperlipidemia, unspecified: Secondary | ICD-10-CM

## 2013-05-19 DIAGNOSIS — E559 Vitamin D deficiency, unspecified: Secondary | ICD-10-CM

## 2013-05-19 DIAGNOSIS — R7309 Other abnormal glucose: Secondary | ICD-10-CM

## 2013-05-19 DIAGNOSIS — Z79899 Other long term (current) drug therapy: Secondary | ICD-10-CM

## 2013-05-19 DIAGNOSIS — Z1212 Encounter for screening for malignant neoplasm of rectum: Secondary | ICD-10-CM

## 2013-05-19 DIAGNOSIS — I10 Essential (primary) hypertension: Secondary | ICD-10-CM

## 2013-05-19 DIAGNOSIS — R7303 Prediabetes: Secondary | ICD-10-CM

## 2013-05-19 LAB — CBC WITH DIFFERENTIAL/PLATELET
BASOS PCT: 1 % (ref 0–1)
Basophils Absolute: 0.1 10*3/uL (ref 0.0–0.1)
EOS ABS: 0.4 10*3/uL (ref 0.0–0.7)
EOS PCT: 5 % (ref 0–5)
HCT: 42.6 % (ref 39.0–52.0)
Hemoglobin: 14.7 g/dL (ref 13.0–17.0)
LYMPHS ABS: 2.4 10*3/uL (ref 0.7–4.0)
Lymphocytes Relative: 32 % (ref 12–46)
MCH: 30.2 pg (ref 26.0–34.0)
MCHC: 34.5 g/dL (ref 30.0–36.0)
MCV: 87.7 fL (ref 78.0–100.0)
Monocytes Absolute: 0.7 10*3/uL (ref 0.1–1.0)
Monocytes Relative: 10 % (ref 3–12)
Neutro Abs: 3.8 10*3/uL (ref 1.7–7.7)
Neutrophils Relative %: 52 % (ref 43–77)
PLATELETS: 248 10*3/uL (ref 150–400)
RBC: 4.86 MIL/uL (ref 4.22–5.81)
RDW: 14 % (ref 11.5–15.5)
WBC: 7.4 10*3/uL (ref 4.0–10.5)

## 2013-05-19 NOTE — Patient Instructions (Signed)

## 2013-05-19 NOTE — Progress Notes (Signed)
Patient ID: Jason Patton, male   DOB: 01-01-1951, 63 y.o.   MRN: [id number]    This very nice 63 y.o. MWM presents for 3 month follow up with Hypertension, Hyperlipidemia, Pre-Diabetes and Vitamin D Deficiency.    HTN predates since Nov 2009. BP has been controlled at home. Today's BP: 122/84 mmHg . Patient denies any cardiac type chest pain, palpitations, dyspnea/orthopnea/PND, dizziness, claudication, or dependent edema.   Hyperlipidemia is controlled with diet & meds. Last lipids as below at goal . Patient denies myalgias or other med SE's.  Lab Results  Component Value Date   CHOL 147 02/18/2013   HDL 41 02/18/2013   LDLCALC 92 02/18/2013   TRIG 72 02/18/2013   CHOLHDL 3.6 02/18/2013    Also, the patient has history of PreDiabetes with A1c 5.8% since Sept 2010 with last A1c of 6.0% in Dec 2014. Patient denies any symptoms of reactive hypoglycemia, diabetic polys, paresthesias or visual blurring.   Further, Patient has history of Vitamin D Deficiency of 32 in 2009 with last vitamin D of 99 in Dec 2014. Patient supplements vitamin D without any suspected side-effects.  Medication Sig  . bisoprolol-hydrochlorothiazide (ZIAC) 10-6.25 MG per tablet Take 1 tablet by mouth daily.  . Cholecalciferol (VITAMIN D3) 5000 UNITS CAPS Take 5,000 Units by mouth daily.  Marland Kitchen losartan (COZAAR) 100 MG tablet Take 50 mg by mouth daily.  Marland Kitchen omeprazole (PRILOSEC) 40 MG capsule Take 40 mg by mouth 2 (two) times daily.     Allergies  Allergen Reactions  . Ace Inhibitors   . Citalopram     PMHx:   Past Medical History  Diagnosis Date  . HTN (hypertension)   . Hyperlipemia     pt denies 08/08/12  . GERD (gastroesophageal reflux disease)   . Obesity   . History of Helicobacter pylori infection   . Vitamin D deficiency   . Hemorrhoids   . Peripheral vascular disease 9/14    DVT  left lower leg    FHx:    Reviewed / unchanged  SHx:    Reviewed / unchanged   Systems Review: Constitutional: Denies  fever, chills, wt changes, headaches, insomnia, fatigue, night sweats, change in appetite. Eyes: Denies redness, blurred vision, diplopia, discharge, itchy, watery eyes.  ENT: Denies discharge, congestion, post nasal drip, epistaxis, sore throat, earache, hearing loss, dental pain, tinnitus, vertigo, sinus pain, snoring.  CV: Denies chest pain, palpitations, irregular heartbeat, syncope, dyspnea, diaphoresis, orthopnea, PND, claudication, edema. Respiratory: denies cough, dyspnea, DOE, pleurisy, hoarseness, laryngitis, wheezing.  Gastrointestinal: Denies dysphagia, odynophagia, heartburn, reflux, water brash, abdominal pain or cramps, nausea, vomiting, bloating, diarrhea, constipation, hematemesis, melena, hematochezia,  or hemorrhoids. Genitourinary: Denies dysuria, frequency, urgency, nocturia, hesitancy, discharge, hematuria, flank pain. Musculoskeletal: Denies arthralgias, myalgias, stiffness, jt. swelling, pain, limp, strain/sprain.  Skin: Denies pruritus, rash, hives, warts, acne, eczema, change in skin lesion(s). Neuro: No weakness, tremor, incoordination, spasms, paresthesia, or pain. Psychiatric: Denies confusion, memory loss, or sensory loss. Endo: Denies change in weight, skin, hair change.  Heme/Lymph: No excessive bleeding, bruising, orenlarged lymph nodes.   Exam:  BP 122/84  Pulse 64  Temp(Src) 98.1 F (36.7 C) (Temporal)  Resp 18  Ht 5\' 11"  (1.803 m)  Wt 254 lb 3.2 oz (115.304 kg)  BMI 35.47 kg/m2  Appears well nourished - in no distress. Eyes: PERRLA, EOMs, conjunctiva no swelling or erythema. Sinuses: No frontal/maxillary tenderness ENT/Mouth: EAC's clear, TM's nl w/o erythema, bulging. Nares clear w/o erythema, swelling, exudates. Oropharynx clear without erythema  or exudates. Oral hygiene is good. Tongue normal, non obstructing. Hearing intact.  Neck: Supple. Thyroid nl. Car 2+/2+ without bruits, nodes or JVD. Chest: Respirations nl with BS clear & equal w/o rales,  rhonchi, wheezing or stridor.  Cor: Heart sounds normal w/ regular rate and rhythm without sig. murmurs, gallops, clicks, or rubs. Peripheral pulses normal and equal  without edema.  Abdomen: Soft & bowel sounds normal. Non-tender w/o guarding, rebound, hernias, masses, or organomegaly.  Lymphatics: Unremarkable.  Musculoskeletal: Full ROM all peripheral extremities, joint stability, 5/5 strength, and normal gait.  Skin: Warm, dry without exposed rashes, lesions, ecchymosis apparent.  Neuro: Cranial nerves intact, reflexes equal bilaterally. Sensory-motor testing grossly intact. Tendon reflexes grossly intact.  Pysch: Alert & oriented x 3. Insight and judgement nl & appropriate. No ideations.  Assessment and Plan:  1. Hypertension - Continue monitor blood pressure at home. Continue diet/meds same.  2. Hyperlipidemia - Continue diet/meds, exercise,& lifestyle modifications. Continue monitor periodic cholesterol/liver & renal functions   3. Pre-diabetes/Insulin Resistance - Continue diet, exercise, lifestyle modifications. Monitor appropriate labs.  4. Vitamin D Deficiency - Continue supplementation.  5. DVT Lt Femoral Vein, Unprovoked w/Neg Coag Profile - discussed w/patient continuing Xarelto thru June and then switching to bASA 81 mg x 2 = 162 mg/daily  Recommended regular exercise, BP monitoring, weight control, and discussed med and SE's. Recommended labs to assess and monitor clinical status. Further disposition pending results of labs.

## 2013-05-20 LAB — HEPATIC FUNCTION PANEL
ALT: 19 U/L (ref 0–53)
AST: 19 U/L (ref 0–37)
Albumin: 4.1 g/dL (ref 3.5–5.2)
Alkaline Phosphatase: 77 U/L (ref 39–117)
BILIRUBIN TOTAL: 0.4 mg/dL (ref 0.2–1.2)
Bilirubin, Direct: 0.1 mg/dL (ref 0.0–0.3)
Indirect Bilirubin: 0.3 mg/dL (ref 0.2–1.2)
Total Protein: 6.7 g/dL (ref 6.0–8.3)

## 2013-05-20 LAB — HEMOGLOBIN A1C
HEMOGLOBIN A1C: 6 % — AB (ref <5.7)
Mean Plasma Glucose: 126 mg/dL — ABNORMAL HIGH (ref <117)

## 2013-05-20 LAB — BASIC METABOLIC PANEL WITH GFR
BUN: 14 mg/dL (ref 6–23)
CALCIUM: 9.6 mg/dL (ref 8.4–10.5)
CO2: 31 meq/L (ref 19–32)
Chloride: 100 mEq/L (ref 96–112)
Creat: 1.11 mg/dL (ref 0.50–1.35)
GFR, Est African American: 81 mL/min
GFR, Est Non African American: 70 mL/min
Glucose, Bld: 101 mg/dL — ABNORMAL HIGH (ref 70–99)
Potassium: 4 mEq/L (ref 3.5–5.3)
Sodium: 138 mEq/L (ref 135–145)

## 2013-05-20 LAB — INSULIN, FASTING: Insulin fasting, serum: 14 u[IU]/mL (ref 3–28)

## 2013-05-20 LAB — VITAMIN D 25 HYDROXY (VIT D DEFICIENCY, FRACTURES): Vit D, 25-Hydroxy: 90 ng/mL — ABNORMAL HIGH (ref 30–89)

## 2013-05-20 LAB — LIPID PANEL
Cholesterol: 158 mg/dL (ref 0–200)
HDL: 46 mg/dL (ref >39)
LDL Cholesterol: 96 mg/dL (ref 0–99)
Total CHOL/HDL Ratio: 3.4 Ratio
Triglycerides: 78 mg/dL (ref <150)
VLDL: 16 mg/dL (ref 0–40)

## 2013-05-20 LAB — MAGNESIUM: MAGNESIUM: 1.9 mg/dL (ref 1.5–2.5)

## 2013-05-20 LAB — TSH: TSH: 1.139 u[IU]/mL (ref 0.350–4.500)

## 2013-08-03 ENCOUNTER — Other Ambulatory Visit: Payer: Self-pay | Admitting: Internal Medicine

## 2013-08-20 ENCOUNTER — Ambulatory Visit (INDEPENDENT_AMBULATORY_CARE_PROVIDER_SITE_OTHER): Payer: BC Managed Care – PPO | Admitting: Internal Medicine

## 2013-08-20 ENCOUNTER — Encounter: Payer: Self-pay | Admitting: Internal Medicine

## 2013-08-20 VITALS — BP 126/82 | HR 72 | Temp 99.0°F | Resp 16 | Ht 71.0 in | Wt 256.0 lb

## 2013-08-20 DIAGNOSIS — Z79899 Other long term (current) drug therapy: Secondary | ICD-10-CM

## 2013-08-20 DIAGNOSIS — E785 Hyperlipidemia, unspecified: Secondary | ICD-10-CM

## 2013-08-20 DIAGNOSIS — R7309 Other abnormal glucose: Secondary | ICD-10-CM

## 2013-08-20 DIAGNOSIS — I10 Essential (primary) hypertension: Secondary | ICD-10-CM

## 2013-08-20 DIAGNOSIS — E559 Vitamin D deficiency, unspecified: Secondary | ICD-10-CM

## 2013-08-20 NOTE — Patient Instructions (Signed)
Recommend the book "the END of DIETING" by Dr Joel Furman   Get the  Book "The END of DIABETES " by Dr Joel Fuhrman  At [employer] - get book & Audio CD's      Being diabetic has a  300% increased risk for heart attack, stroke, cancer, and alzheimer- type vascular dementia. It is very important that you work harder with diet by avoiding all foods that are white except chicken & fish. Avoid white rice (brown & wild rice is OK), white potatoes (sweetpotatoes in moderation is OK), White bread or wheat bread or anything made out of white flour like bagels, donuts, rolls, buns, biscuits, cakes, pastries, cookies, pizza crust, and pasta (made from white flour & egg whites) - vegetarian pasta or spinach or wheat pasta is OK. Multigrain breads like Arnold's or Pepperidge Farm, or multigrain sandwich thins or flatbreads.  Diet, exercise and weight loss can reverse and cure diabetes in the early stages.  Diet, exercise and weight loss is very important in the control and prevention of complications of diabetes which affects every system in your body, ie. Brain - dementia/stroke, eyes - glaucoma/blindness, heart - heart attack/heart failure, kidneys - dialysis, stomach - gastric paralysis, intestines - malabsorption, nerves - severe painful neuritis, circulation - gangrene & loss of a leg(s), and finally cancer and Alzheimers.    I recommend avoid fried & greasy foods,  sweets/candy, white rice (brown or wild rice or Quinoa is OK), white potatoes (sweet potatoes are OK) - anything made from white flour - bagels, doughnuts, rolls, buns, biscuits,white and wheat breads, pizza crust and traditional pasta made of white flour & egg white(vegetarian pasta or spinach or wheat pasta is OK).  Multi-grain bread is OK - like multi-grain flat bread or sandwich thins. Avoid alcohol in excess. Exercise is also important.    Eat all the vegetables you want - avoid meat, especially red meat and dairy - especially cheese.  Cheese is  the most concentrated form of trans-fats which is the worst thing to clog up our arteries. Veggie cheese is OK which can be found in the fresh produce section at [employer] or [employer] or [employer]   

## 2013-08-20 NOTE — Progress Notes (Signed)
Patient ID: Jason Patton, male   DOB: October 12, 1950, 63 y.o.   MRN: [id number]    This very nice 63 y.o.MWM presents for 3 month follow up with Hypertension, Hyperlipidemia, Pre-Diabetes and Vitamin D Deficiency.    Patient has HTN predating since 01/2008 and BP has been controlled at home w/o any med SE's. Today's BP: 126/82 mmHg. Patient denies any cardiac type chest pain, palpitations, dyspnea/orthopnea/PND, dizziness, claudication, or dependent edema.   Hyperlipidemia is controlled with diet & meds. Last Lipids as below are at goal. Patient denies myalgias or other med SE's.  Lab Results  Component Value Date   CHOL 158 05/19/2013   HDL 46 05/19/2013   LDLCALC 96 05/19/2013   TRIG 78 05/19/2013   CHOLHDL 3.4 05/19/2013    Also, the patient has history of PreDiabetes/insulin resistance since 11/2008  (A1c 5.8%) and patient denies any symptoms of reactive hypoglycemia, diabetic polys, paresthesias or visual blurring. His last A1c was 6.0% in Mar 2015.   Further, Patient has history of Vitamin D Deficiency and his last vitamin D was 68 in Sept. Patient supplements vitamin D without any suspected side-effects.    Medication List   bisoprolol-hydrochlorothiazide 10-6.25 MG per tablet  Commonly known as:  ZIAC  TAKE 1 TABLET BY MOUTH EACH DAY FOR BLOOD PRESSURE     losartan 100 MG tablet  Commonly known as:  COZAAR  TAKE 1 TABLET BY MOUTH DAILY.     omeprazole 40 MG capsule  Commonly known as:  PRILOSEC  Take 40 mg by mouth 2 (two) times daily.     VITAMIN B-1 PO  Take by mouth daily. Sub-lingual     Vitamin D3 5000 UNITS Caps  Take 5,000 Units by mouth daily.     XARELTO 20 MG Tabs tablet  Generic drug:  rivaroxaban       Allergies  Allergen Reactions  . Ace Inhibitors   . Citalopram    PMHx:   Past Medical History  Diagnosis Date  . HTN (hypertension)   . Hyperlipemia     pt denies 08/08/12  . GERD (gastroesophageal reflux disease)   . Obesity   . History of Helicobacter  pylori infection   . Vitamin D deficiency   . Hemorrhoids   . Peripheral vascular disease 9/14    DVT  left lower leg   FHx:    Reviewed / unchanged  SHx:    Reviewed / unchanged   Systems Review: Constitutional: Denies fever, chills, wt changes, headaches, insomnia, fatigue, night sweats, change in appetite. Eyes: Denies redness, blurred vision, diplopia, discharge, itchy, watery eyes.  ENT: Denies discharge, congestion, post nasal drip, epistaxis, sore throat, earache, hearing loss, dental pain, tinnitus, vertigo, sinus pain, snoring.  CV: Denies chest pain, palpitations, irregular heartbeat, syncope, dyspnea, diaphoresis, orthopnea, PND, claudication or edema. Respiratory: denies cough, dyspnea, DOE, pleurisy, hoarseness, laryngitis, wheezing.  Gastrointestinal: Denies dysphagia, odynophagia, heartburn, reflux, water brash, abdominal pain or cramps, nausea, vomiting, bloating, diarrhea, constipation, hematemesis, melena, hematochezia  or hemorrhoids. Genitourinary: Denies dysuria, frequency, urgency, nocturia, hesitancy, discharge, hematuria or flank pain. Musculoskeletal: Denies arthralgias, myalgias, stiffness, jt. swelling, pain, limping or strain/sprain.  Skin: Denies pruritus, rash, hives, warts, acne, eczema or change in skin lesion(s). Neuro: No weakness, tremor, incoordination, spasms, paresthesia or pain. Psychiatric: Denies confusion, memory loss or sensory loss. Endo: Denies change in weight, skin or hair change.  Heme/Lymph: No excessive bleeding, bruising or enlarged lymph nodes.   Exam:  BP 126/82  Pulse 72  Temp(Src)  99 F (37.2 C) (Temporal)  Resp 16  Ht 5\' 11"  (1.803 m)  Wt 256 lb (116.121 kg)  BMI 35.72 kg/m2  Appears well nourished - in no distress. Eyes: PERRLA, EOMs, conjunctiva no swelling or erythema. Sinuses: No frontal/maxillary tenderness ENT/Mouth: EAC's clear, TM's nl w/o erythema, bulging. Nares clear w/o erythema, swelling, exudates. Oropharynx  clear without erythema or exudates. Oral hygiene is good. Tongue normal, non obstructing. Hearing intact.  Neck: Supple. Thyroid nl. Car 2+/2+ without bruits, nodes or JVD. Chest: Respirations nl with BS clear & equal w/o rales, rhonchi, wheezing or stridor.  Cor: Heart sounds normal w/ regular rate and rhythm without sig. murmurs, gallops, clicks, or rubs. Peripheral pulses normal and equal  without edema.  Abdomen: Soft & bowel sounds normal. Non-tender w/o guarding, rebound, hernias, masses, or organomegaly.  Lymphatics: Unremarkable.  Musculoskeletal: Full ROM all peripheral extremities, joint stability, 5/5 strength, and normal gait.  Skin: Warm, dry without exposed rashes, lesions or ecchymosis apparent.  Neuro: Cranial nerves intact, reflexes equal bilaterally. Sensory-motor testing grossly intact. Tendon reflexes grossly intact.  Pysch: Alert & oriented x 3. Insight and judgement nl & appropriate. No ideations.  Assessment and Plan:  1. Hypertension - Continue monitor blood pressure at home. Continue diet/meds same.  2. Hyperlipidemia - Continue diet/meds, exercise,& lifestyle modifications. Continue monitor periodic cholesterol/liver & renal functions   3. Pre-diabetes/Insulin Resistance - Continue diet, exercise, lifestyle modifications. Monitor appropriate labs.  4. Vitamin D Deficiency - Continue supplementation.  Recommended regular exercise, BP monitoring, weight control, and discussed med and SE's. Recommended labs to assess and monitor clinical status. Further disposition pending results of labs.

## 2013-08-21 LAB — CBC WITH DIFFERENTIAL/PLATELET
Basophils Absolute: 0 10*3/uL (ref 0.0–0.1)
Basophils Relative: 0 % (ref 0–1)
EOS ABS: 0.2 10*3/uL (ref 0.0–0.7)
EOS PCT: 3 % (ref 0–5)
HCT: 47 % (ref 39.0–52.0)
Hemoglobin: 16.2 g/dL (ref 13.0–17.0)
Lymphocytes Relative: 14 % (ref 12–46)
Lymphs Abs: 0.9 10*3/uL (ref 0.7–4.0)
MCH: 30.5 pg (ref 26.0–34.0)
MCHC: 34.5 g/dL (ref 30.0–36.0)
MCV: 88.3 fL (ref 78.0–100.0)
MONOS PCT: 12 % (ref 3–12)
Monocytes Absolute: 0.8 10*3/uL (ref 0.1–1.0)
Neutro Abs: 4.7 10*3/uL (ref 1.7–7.7)
Neutrophils Relative %: 71 % (ref 43–77)
PLATELETS: 189 10*3/uL (ref 150–400)
RBC: 5.32 MIL/uL (ref 4.22–5.81)
RDW: 13.5 % (ref 11.5–15.5)
WBC: 6.6 10*3/uL (ref 4.0–10.5)

## 2013-08-21 LAB — HEPATIC FUNCTION PANEL
ALT: 20 U/L (ref 0–53)
AST: 23 U/L (ref 0–37)
Albumin: 4.2 g/dL (ref 3.5–5.2)
Alkaline Phosphatase: 86 U/L (ref 39–117)
Bilirubin, Direct: 0.1 mg/dL (ref 0.0–0.3)
Indirect Bilirubin: 0.4 mg/dL (ref 0.2–1.2)
TOTAL PROTEIN: 7.1 g/dL (ref 6.0–8.3)
Total Bilirubin: 0.5 mg/dL (ref 0.2–1.2)

## 2013-08-21 LAB — BASIC METABOLIC PANEL WITH GFR
BUN: 18 mg/dL (ref 6–23)
CALCIUM: 9.6 mg/dL (ref 8.4–10.5)
CO2: 30 mEq/L (ref 19–32)
CREATININE: 1.06 mg/dL (ref 0.50–1.35)
Chloride: 98 mEq/L (ref 96–112)
GFR, Est African American: 85 mL/min
GFR, Est Non African American: 74 mL/min
Glucose, Bld: 94 mg/dL (ref 70–99)
Potassium: 3.6 mEq/L (ref 3.5–5.3)
Sodium: 139 mEq/L (ref 135–145)

## 2013-08-21 LAB — LIPID PANEL
Cholesterol: 144 mg/dL (ref 0–200)
HDL: 45 mg/dL (ref >39)
LDL Cholesterol: 85 mg/dL (ref 0–99)
Total CHOL/HDL Ratio: 3.2 Ratio
Triglycerides: 69 mg/dL (ref <150)
VLDL: 14 mg/dL (ref 0–40)

## 2013-08-21 LAB — HEMOGLOBIN A1C
Hgb A1c MFr Bld: 6 % — ABNORMAL HIGH (ref <5.7)
MEAN PLASMA GLUCOSE: 126 mg/dL — AB (ref <117)

## 2013-08-21 LAB — VITAMIN D 25 HYDROXY (VIT D DEFICIENCY, FRACTURES): Vit D, 25-Hydroxy: 89 ng/mL (ref 30–89)

## 2013-08-21 LAB — TSH: TSH: 1.124 u[IU]/mL (ref 0.350–4.500)

## 2013-08-21 LAB — MAGNESIUM: MAGNESIUM: 1.8 mg/dL (ref 1.5–2.5)

## 2013-08-21 LAB — INSULIN, FASTING: INSULIN FASTING, SERUM: 18 u[IU]/mL (ref 3–28)

## 2013-09-09 ENCOUNTER — Other Ambulatory Visit: Payer: Self-pay | Admitting: Internal Medicine

## 2013-09-10 ENCOUNTER — Other Ambulatory Visit: Payer: Self-pay | Admitting: Internal Medicine

## 2013-11-02 ENCOUNTER — Other Ambulatory Visit: Payer: Self-pay | Admitting: Emergency Medicine

## 2013-11-24 ENCOUNTER — Encounter: Payer: Self-pay | Admitting: Internal Medicine

## 2013-11-24 ENCOUNTER — Ambulatory Visit (INDEPENDENT_AMBULATORY_CARE_PROVIDER_SITE_OTHER): Payer: BC Managed Care – PPO | Admitting: Internal Medicine

## 2013-11-24 VITALS — BP 128/86 | HR 66 | Temp 98.4°F | Resp 16 | Ht 71.0 in | Wt 266.0 lb

## 2013-11-24 DIAGNOSIS — E559 Vitamin D deficiency, unspecified: Secondary | ICD-10-CM

## 2013-11-24 DIAGNOSIS — R7402 Elevation of levels of lactic acid dehydrogenase (LDH): Secondary | ICD-10-CM

## 2013-11-24 DIAGNOSIS — Z113 Encounter for screening for infections with a predominantly sexual mode of transmission: Secondary | ICD-10-CM

## 2013-11-24 DIAGNOSIS — I10 Essential (primary) hypertension: Secondary | ICD-10-CM

## 2013-11-24 DIAGNOSIS — N320 Bladder-neck obstruction: Secondary | ICD-10-CM

## 2013-11-24 DIAGNOSIS — Z125 Encounter for screening for malignant neoplasm of prostate: Secondary | ICD-10-CM

## 2013-11-24 DIAGNOSIS — Z0000 Encounter for general adult medical examination without abnormal findings: Secondary | ICD-10-CM

## 2013-11-24 DIAGNOSIS — N183 Chronic kidney disease, stage 3 unspecified: Secondary | ICD-10-CM | POA: Insufficient documentation

## 2013-11-24 DIAGNOSIS — R7309 Other abnormal glucose: Secondary | ICD-10-CM

## 2013-11-24 DIAGNOSIS — Z1212 Encounter for screening for malignant neoplasm of rectum: Secondary | ICD-10-CM

## 2013-11-24 DIAGNOSIS — R740 Nonspecific elevation of levels of transaminase and lactic acid dehydrogenase [LDH]: Secondary | ICD-10-CM

## 2013-11-24 DIAGNOSIS — R7401 Elevation of levels of liver transaminase levels: Secondary | ICD-10-CM

## 2013-11-24 DIAGNOSIS — E782 Mixed hyperlipidemia: Secondary | ICD-10-CM

## 2013-11-24 DIAGNOSIS — N182 Chronic kidney disease, stage 2 (mild): Secondary | ICD-10-CM

## 2013-11-24 LAB — CBC WITH DIFFERENTIAL/PLATELET
BASOS ABS: 0 10*3/uL (ref 0.0–0.1)
BASOS PCT: 0 % (ref 0–1)
Eosinophils Absolute: 0.4 10*3/uL (ref 0.0–0.7)
Eosinophils Relative: 5 % (ref 0–5)
HCT: 44.1 % (ref 39.0–52.0)
Hemoglobin: 15.8 g/dL (ref 13.0–17.0)
LYMPHS PCT: 30 % (ref 12–46)
Lymphs Abs: 2.6 10*3/uL (ref 0.7–4.0)
MCH: 30.7 pg (ref 26.0–34.0)
MCHC: 35.8 g/dL (ref 30.0–36.0)
MCV: 85.8 fL (ref 78.0–100.0)
Monocytes Absolute: 0.8 10*3/uL (ref 0.1–1.0)
Monocytes Relative: 9 % (ref 3–12)
NEUTROS ABS: 4.8 10*3/uL (ref 1.7–7.7)
Neutrophils Relative %: 56 % (ref 43–77)
PLATELETS: 255 10*3/uL (ref 150–400)
RBC: 5.14 MIL/uL (ref 4.22–5.81)
RDW: 13.7 % (ref 11.5–15.5)
WBC: 8.5 10*3/uL (ref 4.0–10.5)

## 2013-11-24 NOTE — Progress Notes (Signed)
Patient ID: Jason Patton, male   DOB: 12-Feb-1951, 63 y.o.   MRN: [id number]  Annual Screening Comprehensive Examination  This very nice 63 y.o.MWM presents for complete physical.  Patient has been followed for HTN, T2_NIDDM w/CKD2, Hyperlipidemia, and Vitamin D Deficiency. In Aug 2014 Patient developed a left calf DVT after sitting on a tractor about 6 hours total one day. Patient has been on Xarelto since w/o complications or SE's.   HTN predates since Nov 2011. Patient's BP has been controlled at home.Today's BP was 128/86 mmHg. Patient denies any cardiac symptoms as chest pain, palpitations, shortness of breath, dizziness or ankle swelling.   Patient's hyperlipidemia is controlled with diet and medications. Patient denies myalgias or other medication SE's. Last lipids were  Total Chol 144; HDL  45; LDL  85; Trig 69 on 08/20/2013.   Patient has  Morbid Obesity (BMI 37.12) and consequent T2_NIDDM (11/2008) and CKD2 (GFR 74 ml/min) currently controlled with diet  and patient denies reactive hypoglycemic symptoms, visual blurring, diabetic polys, or paresthesias. Last A1c was 6.0% on 08/20/2013.   Finally, patient has history of Vitamin D Deficiency of  32 in 2009  and last vitamin D was  89 on 08/20/2013.   Medication Sig  . bisoprolol-hydrochlorothiazide (ZIAC) 10-6.25 MG per tablet TAKE 1 TABLET BY MOUTH EACH DAY FOR BLOOD PRESSURE  . Cholecalciferol (VITAMIN D3) 5000 UNITS CAPS Take 5,000 Units by mouth daily.  Marland Kitchen losartan (COZAAR) 100 MG tablet TAKE 1 TABLET BY MOUTH DAILY.  Marland Kitchen omeprazole (PRILOSEC) 40 MG capsule TAKE 1 CAPSULE BY MOUTH 2 TIMES A DAY.  Marland Kitchen Thiamine HCl (VITAMIN B-1 PO) Take by mouth daily. Sub-lingual  . XARELTO 20 MG TABS tablet     Allergies  Allergen Reactions  . Ace Inhibitors   . Citalopram     Past Medical History  Diagnosis Date  . HTN (hypertension)   . Hyperlipemia     pt denies 08/08/12  . GERD (gastroesophageal reflux disease)   . Obesity   . History of  Helicobacter pylori infection   . Vitamin D deficiency   . Hemorrhoids   . Peripheral vascular disease 9/14    DVT  left lower leg   Past Surgical History  Procedure Laterality Date  . Hemorrhoid surgery      age 48  . Tonsillectomy    . Cystoscopy/retrograde/ureteroscopy Left 01/02/2013    Procedure: CYSTOSCOPY/LEFT URETERAL WASHINGS/LEFT RETROGRADE PYELOGRAM/ LEFT URETEROSCOPY/URETERAL BIOPSY. BLADDER BIOPSY;  Surgeon: Ardis Hughs, MD;  Location: [facility];  Service: Urology;  Laterality: Left;  With STENT   Family History  Problem Relation Age of Onset  . Diabetes Father   . Heart disease Father   . Cancer Mother     Bladder   History   Social History  . Marital Status: Married    Spouse Name: N/A    Number of Children: 1  . Years of Education: N/A   Occupational History  . Engineer, materials    Social History Main Topics  . Smoking status: Never Smoker   . Smokeless tobacco: Never Used  . Alcohol Use: No  . Drug Use: No  . Sexual Activity: Not on file   Other Topics Concern  . Not on file   Social History Narrative  . No narrative on file    ROS Constitutional: Denies fever, chills, weight loss/gain, headaches, insomnia, fatigue, night sweats or change in appetite. Eyes: Denies redness, blurred vision, diplopia, discharge, itchy or watery eyes.  ENT: Denies discharge, congestion, post nasal drip, epistaxis, sore throat, earache, hearing loss, dental pain, Tinnitus, Vertigo, Sinus pain or snoring.  Cardio: Denies chest pain, palpitations, irregular heartbeat, syncope, dyspnea, diaphoresis, orthopnea, PND, claudication or edema Respiratory: denies cough, dyspnea, DOE, pleurisy, hoarseness, laryngitis or wheezing.  Gastrointestinal: Denies dysphagia, heartburn, reflux, water brash, pain, cramps, nausea, vomiting, bloating, diarrhea, constipation, hematemesis, melena, hematochezia, jaundice or hemorrhoids Genitourinary: Denies dysuria, frequency, urgency,  nocturia, hesitancy, discharge, hematuria or flank pain Musculoskeletal: Denies arthralgia, myalgia, stiffness, Jt. Swelling, pain, limp or strain/sprain. Denies Falls. Skin: Denies puritis, rash, hives, warts, acne, eczema or change in skin lesion Neuro: No weakness, tremor, incoordination, spasms, paresthesia or pain Psychiatric: Denies confusion, memory loss or sensory loss. Denies Depression. Endocrine: Denies change in weight, skin, hair change, nocturia, and paresthesia, diabetic polys, visual blurring or hyper / hypo glycemic episodes.  Heme/Lymph: No excessive bleeding, bruising or enlarged lymph nodes.  Physical Exam  BP 128/86  Pulse 66  Temp(Src) 98.4 F (36.9 C) (Temporal)  Resp 16  Ht 5\' 11"  (1.803 m)  Wt 266 lb (120.657 kg)  BMI 37.12 kg/m2  General Appearance: Well nourished, in no apparent distress. Eyes: PERRLA, EOMs, conjunctiva no swelling or erythema, normal fundi and vessels. Sinuses: No frontal/maxillary tenderness ENT/Mouth: EACs patent / TMs  nl. Nares clear without erythema, swelling, mucoid exudates. Oral hygiene is good. No erythema, swelling, or exudate. Tongue normal, non-obstructing. Tonsils not swollen or erythematous. Hearing normal.  Neck: Supple, thyroid normal. No bruits, nodes or JVD. Respiratory: Respiratory effort normal.  BS equal and clear bilateral without rales, rhonci, wheezing or stridor. Cardio: Heart sounds are normal with regular rate and rhythm and no murmurs, rubs or gallops. Peripheral pulses are normal and equal bilaterally without edema. No aortic or femoral bruits. Chest: symmetric with normal excursions and percussion.  Abdomen: Flat, soft, with bowl sounds. Nontender, no guarding, rebound, hernias, masses, or organomegaly.  Lymphatics: Non tender without lymphadenopathy.  Genitourinary: No hernias.Testes nl. DRE - prostate nl for age - smooth & firm w/o nodules. Musculoskeletal: Full ROM all peripheral extremities, joint stability,  5/5 strength, and normal gait. Skin: Warm and dry without rashes, lesions, cyanosis, clubbing or  ecchymosis.  Neuro: Cranial nerves intact, reflexes equal bilaterally. Normal muscle tone, no cerebellar symptoms. Sensation intact.  Pysch: Awake and oriented X 3with normal affect, insight and judgment appropriate.  Assessment and Plan  1. Annual Screening Examination 2. Hypertension  3. Hyperlipidemia 4. T2_NIDDM w/ CKD2 5. Vitamin D Deficiency 6 Hx Calf DVT (10/2012)    Continue prudent diet as discussed, weight control, BP monitoring, regular exercise, and medications as discussed.  Discussed med effects and SE's. Routine screening labs and tests as requested with regular follow-up as recommended.  Advised stop Xarelto and switch to 2 bASA daily and avoid prolonged sitting with knees hyperflexed.

## 2013-11-24 NOTE — Patient Instructions (Signed)
Recommend the book "The END of DIETING" by Dr Baker Janus   and the book "The END of DIABETES " by Dr Excell Seltzer  At [email or web address] - get book & Audio CD's      Being diabetic has a  300% increased risk for heart attack, stroke, cancer, and alzheimer- type vascular dementia. It is very important that you work harder with diet by avoiding all foods that are white except chicken & fish. Avoid white rice (brown & wild rice is OK), white potatoes (sweetpotatoes in moderation is OK), White bread or wheat bread or anything made out of white flour like bagels, donuts, rolls, buns, biscuits, cakes, pastries, cookies, pizza crust, and pasta (made from white flour & egg whites) - vegetarian pasta or spinach or wheat pasta is OK. Multigrain breads like Arnold's or [employer], or multigrain sandwich thins or flatbreads.  Diet, exercise and weight loss can reverse and cure diabetes in the early stages.  Diet, exercise and weight loss is very important in the control and prevention of complications of diabetes which affects every system in your body, ie. Brain - dementia/stroke, eyes - glaucoma/blindness, heart - heart attack/heart failure, kidneys - dialysis, stomach - gastric paralysis, intestines - malabsorption, nerves - severe painful neuritis, circulation - gangrene & loss of a leg(s), and finally cancer and Alzheimers.    I recommend avoid fried & greasy foods,  sweets/candy, white rice (brown or wild rice or Quinoa is OK), white potatoes (sweet potatoes are OK) - anything made from white flour - bagels, doughnuts, rolls, buns, biscuits,white and wheat breads, pizza crust and traditional pasta made of white flour & egg white(vegetarian pasta or spinach or wheat pasta is OK).  Multi-grain bread is OK - like multi-grain flat bread or sandwich thins. Avoid alcohol in excess. Exercise is also important.    Eat all the vegetables you want - avoid meat, especially red meat and dairy - especially cheese.  Cheese  is the most concentrated form of trans-fats which is the worst thing to clog up our arteries. Veggie cheese is OK which can be found in the fresh produce section at [employer] or [employer] or [employer]   Preventive Care for Adults A healthy lifestyle and preventive care can promote health and wellness. Preventive health guidelines for men include the following key practices:  A routine yearly physical is a good way to check with your health care provider about your health and preventative screening. It is a chance to share any concerns and updates on your health and to receive a thorough exam.  Visit your dentist for a routine exam and preventative care every 6 months. Brush your teeth twice a day and floss once a day. Good oral hygiene prevents tooth decay and gum disease.  The frequency of eye exams is based on your age, health, family medical history, use of contact lenses, and other factors. Follow your health care provider's recommendations for frequency of eye exams.  Eat a healthy diet. Foods such as vegetables, fruits, whole grains, low-fat dairy products, and lean protein foods contain the nutrients you need without too many calories. Decrease your intake of foods high in solid fats, added sugars, and salt. Eat the right amount of calories for you.Get information about a proper diet from your health care provider, if necessary.  Regular physical exercise is one of the most important things you can do for your health. Most adults should get at least 150 minutes of moderate-intensity exercise (any activity  increases your heart rate and causes you to sweat) each week. In addition, most adults need muscle-strengthening exercises on 2 or more days a week.  Maintain a healthy weight. The body mass index (BMI) is a screening tool to identify possible weight problems. It provides an estimate of body fat based on height and weight. Your health care provider can find your BMI and can help you  achieve or maintain a healthy weight.For adults 20 years and older:  A BMI below 18.5 is considered underweight.  A BMI of 18.5 to 24.9 is normal.  A BMI of 25 to 29.9 is considered overweight.  A BMI of 30 and above is considered obese.  Maintain normal blood lipids and cholesterol levels by exercising and minimizing your intake of saturated fat. Eat a balanced diet with plenty of fruit and vegetables. Blood tests for lipids and cholesterol should begin at age 20 and be repeated every 5 years. If your lipid or cholesterol levels are high, you are over 50, or you are at high risk for heart disease, you may need your cholesterol levels checked more frequently.Ongoing high lipid and cholesterol levels should be treated with medicines if diet and exercise are not working.  If you smoke, find out from your health care provider how to quit. If you do not use tobacco, do not start.  Lung cancer screening is recommended for adults aged 55-80 years who are at high risk for developing lung cancer because of a history of smoking. A yearly low-dose CT scan of the lungs is recommended for people who have at least a 30-pack-year history of smoking and are a current smoker or have quit within the past 15 years. A pack year of smoking is smoking an average of 1 pack of cigarettes a day for 1 year (for example: 1 pack a day for 30 years or 2 packs a day for 15 years). Yearly screening should continue until the smoker has stopped smoking for at least 15 years. Yearly screening should be stopped for people who develop a health problem that would prevent them from having lung cancer treatment.  If you choose to drink alcohol, do not have more than 2 drinks per day. One drink is considered to be 12 ounces (355 mL) of beer, 5 ounces (148 mL) of wine, or 1.5 ounces (44 mL) of liquor.  Avoid use of street drugs. Do not share needles with anyone. Ask for help if you need support or instructions about stopping the use of  drugs.  High blood pressure causes heart disease and increases the risk of stroke. Your blood pressure should be checked at least every 1-2 years. Ongoing high blood pressure should be treated with medicines, if weight loss and exercise are not effective.  If you are 45-79 years old, ask your health care provider if you should take aspirin to prevent heart disease.  Diabetes screening involves taking a blood sample to check your fasting blood sugar level. This should be done once every 3 years, after age 45, if you are within normal weight and without risk factors for diabetes. Testing should be considered at a younger age or be carried out more frequently if you are overweight and have at least 1 risk factor for diabetes.  Colorectal cancer can be detected and often prevented. Most routine colorectal cancer screening begins at the age of 50 and continues through age 75. However, your health care provider may recommend screening at an earlier age if you have risk   risk factors for colon cancer. On a yearly basis, your health care provider may provide home test kits to check for hidden blood in the stool. Use of a small camera at the end of a tube to directly examine the colon (sigmoidoscopy or colonoscopy) can detect the earliest forms of colorectal cancer. Talk to your health care provider about this at age 59, when routine screening begins. Direct exam of the colon should be repeated every 5-10 years through age 21, unless early forms of precancerous polyps or small growths are found.  People who are at an increased risk for hepatitis B should be screened for this virus. You are considered at high risk for hepatitis B if:  You were born in a country where hepatitis B occurs often. Talk with your health care provider about which countries are considered high risk.  Your parents were born in a high-risk country and you have not received a shot to protect against hepatitis B (hepatitis B  vaccine).  You have HIV or AIDS.  You use needles to inject street drugs.  You live with, or have sex with, someone who has hepatitis B.  You are a man who has sex with other men (MSM).  You get hemodialysis treatment.  You take certain medicines for conditions such as cancer, organ transplantation, and autoimmune conditions.  Hepatitis C blood testing is recommended for all people born from [birth year] through 1965 and any individual with known risks for hepatitis C.  Practice safe sex. Use condoms and avoid high-risk sexual practices to reduce the spread of sexually transmitted infections (STIs). STIs include gonorrhea, chlamydia, syphilis, trichomonas, herpes, HPV, and human immunodeficiency virus (HIV). Herpes, HIV, and HPV are viral illnesses that have no cure. They can result in disability, cancer, and death.  If you are at risk of being infected with HIV, it is recommended that you take a prescription medicine daily to prevent HIV infection. This is called preexposure prophylaxis (PrEP). You are considered at risk if:  You are a man who has sex with other men (MSM) and have other risk factors.  You are a heterosexual man, are sexually active, and are at increased risk for HIV infection.  You take drugs by injection.  You are sexually active with a partner who has HIV.  Talk with your health care provider about whether you are at high risk of being infected with HIV. If you choose to begin PrEP, you should first be tested for HIV. You should then be tested every 3 months for as long as you are taking PrEP.  A one-time screening for abdominal aortic aneurysm (AAA) and surgical repair of large AAAs by ultrasound are recommended for men ages 6 to 52 years who are current or former smokers.  Healthy men should no longer receive prostate-specific antigen (PSA) blood tests as part of routine cancer screening. Talk with your health care provider about prostate cancer screening.  Testicular  cancer screening is not recommended for adult males who have no symptoms. Screening includes self-exam, a health care provider exam, and other screening tests. Consult with your health care provider about any symptoms you have or any concerns you have about testicular cancer.  Use sunscreen. Apply sunscreen liberally and repeatedly throughout the day. You should seek shade when your shadow is shorter than you. Protect yourself by wearing long sleeves, pants, a wide-brimmed hat, and sunglasses year round, whenever you are outdoors.  Once a month, do a whole-body skin exam, using a mirror to  at the skin on your back. Tell your health care provider about new moles, moles that have irregular borders, moles that are larger than a pencil eraser, or moles that have changed in shape or color.  Stay current with required vaccines (immunizations).  Influenza vaccine. All adults should be immunized every year.  Tetanus, diphtheria, and acellular pertussis (Td, Tdap) vaccine. An adult who has not previously received Tdap or who does not know his vaccine status should receive 1 dose of Tdap. This initial dose should be followed by tetanus and diphtheria toxoids (Td) booster doses every 10 years. Adults with an unknown or incomplete history of completing a 3-dose immunization series with Td-containing vaccines should begin or complete a primary immunization series including a Tdap dose. Adults should receive a Td booster every 10 years.  Varicella vaccine. An adult without evidence of immunity to varicella should receive 2 doses or a second dose if he has previously received 1 dose.  Human papillomavirus (HPV) vaccine. Males aged 13-21 years who have not received the vaccine previously should receive the 3-dose series. Males aged 22-26 years may be immunized. Immunization is recommended through the age of 26 years for any male who has sex with males and did not get any or all doses earlier. Immunization is recommended for any  person with an immunocompromised condition through the age of 26 years if he did not get any or all doses earlier. During the 3-dose series, the second dose should be obtained 4-8 weeks after the first dose. The third dose should be obtained 24 weeks after the first dose and 16 weeks after the second dose.  Zoster vaccine. One dose is recommended for adults aged 60 years or older unless certain conditions are present.  Measles, mumps, and rubella (MMR) vaccine. Adults born before 1957 generally are considered immune to measles and mumps. Adults born in 1957 or later should have 1 or more doses of MMR vaccine unless there is a contraindication to the vaccine or there is laboratory evidence of immunity to each of the three diseases. A routine second dose of MMR vaccine should be obtained at least 28 days after the first dose for students attending postsecondary schools, health care workers, or international travelers. People who received inactivated measles vaccine or an unknown type of measles vaccine during 1963-1967 should receive 2 doses of MMR vaccine. People who received inactivated mumps vaccine or an unknown type of mumps vaccine before 1979 and are at high risk for mumps infection should consider immunization with 2 doses of MMR vaccine. Unvaccinated health care workers born before 1957 who lack laboratory evidence of measles, mumps, or rubella immunity or laboratory confirmation of disease should consider measles and mumps immunization with 2 doses of MMR vaccine or rubella immunization with 1 dose of MMR vaccine.  Pneumococcal 13-valent conjugate (PCV13) vaccine. When indicated, a person who is uncertain of his immunization history and has no record of immunization should receive the PCV13 vaccine. An adult aged 19 years or older who has certain medical conditions and has not been previously immunized should receive 1 dose of PCV13 vaccine. This PCV13 should be followed with a dose of pneumococcal  polysaccharide (PPSV23) vaccine. The PPSV23 vaccine dose should be obtained at least 8 weeks after the dose of PCV13 vaccine. An adult aged 19 years or older who has certain medical conditions and previously received 1 or more doses of PPSV23 vaccine should receive 1 dose of PCV13. The PCV13 vaccine dose should be obtained 1   1 or more years after the last PPSV23 vaccine dose.  Pneumococcal polysaccharide (PPSV23) vaccine. When PCV13 is also indicated, PCV13 should be obtained first. All adults aged 35 years and older should be immunized. An adult younger than age 68 years who has certain medical conditions should be immunized. Any person who resides in a nursing home or long-term care facility should be immunized. An adult smoker should be immunized. People with an immunocompromised condition and certain other conditions should receive both PCV13 and PPSV23 vaccines. People with human immunodeficiency virus (HIV) infection should be immunized as soon as possible after diagnosis. Immunization during chemotherapy or radiation therapy should be avoided. Routine use of PPSV23 vaccine is not recommended for American Indians, [place] Natives, or people younger than 65 years unless there are medical conditions that require PPSV23 vaccine. When indicated, people who have unknown immunization and have no record of immunization should receive PPSV23 vaccine. One-time revaccination 5 years after the first dose of PPSV23 is recommended for people aged 19-64 years who have chronic kidney failure, nephrotic syndrome, asplenia, or immunocompromised conditions. People who received 1-2 doses of PPSV23 before age 61 years should receive another dose of PPSV23 vaccine at age 34 years or later if at least 5 years have passed since the previous dose. Doses of PPSV23 are not needed for people immunized with PPSV23 at or after age 34 years.  Meningococcal vaccine. Adults with asplenia or persistent complement component  deficiencies should receive 2 doses of quadrivalent meningococcal conjugate (MenACWY-D) vaccine. The doses should be obtained at least 2 months apart. Microbiologists working with certain meningococcal bacteria, [place] recruits, people at risk during an outbreak, and people who travel to or live in countries with a high rate of meningitis should be immunized. A first-year college student up through age 63 years who is living in a residence hall should receive a dose if he did not receive a dose on or after his 16th birthday. Adults who have certain high-risk conditions should receive one or more doses of vaccine.  Hepatitis A vaccine. Adults who wish to be protected from this disease, have certain high-risk conditions, work with hepatitis A-infected animals, work in hepatitis A research labs, or travel to or work in countries with a high rate of hepatitis A should be immunized. Adults who were previously unvaccinated and who anticipate close contact with an international adoptee during the first 60 days after arrival in the [place] from a country with a high rate of hepatitis A should be immunized.  Hepatitis B vaccine. Adults should be immunized if they wish to be protected from this disease, have certain high-risk conditions, may be exposed to blood or other infectious body fluids, are household contacts or sex partners of hepatitis B positive people, are clients or workers in certain care facilities, or travel to or work in countries with a high rate of hepatitis B.  Haemophilus influenzae type b (Hib) vaccine. A previously unvaccinated person with asplenia or sickle cell disease or having a scheduled splenectomy should receive 1 dose of Hib vaccine. Regardless of previous immunization, a recipient of a hematopoietic stem cell transplant should receive a 3-dose series 6-12 months after his successful transplant. Hib vaccine is not recommended for adults with HIV infection. Preventive Service /  Frequency   Ages 42 to 22  Blood pressure check.** / Every 1 to 2 years.  Lipid and cholesterol check.** / Every 5 years beginning at age 51.  Lung cancer screening. / Every year if you  are aged 3-80 years and have a 30-pack-year history of smoking and currently smoke or have quit within the past 15 years. Yearly screening is stopped once you have quit smoking for at least 15 years or develop a health problem that would prevent you from having lung cancer treatment.  Fecal occult blood test (FOBT) of stool. / Every year beginning at age 41 and continuing until age 54. You may not have to do this test if you get a colonoscopy every 10 years.  Flexible sigmoidoscopy** or colonoscopy.** / Every 5 years for a flexible sigmoidoscopy or every 10 years for a colonoscopy beginning at age 45 and continuing until age 3.  Hepatitis C blood test.** / For all people born from [birth year] through 1965 and any individual with known risks for hepatitis C.  Skin self-exam. / Monthly.  Influenza vaccine. / Every year.  Tetanus, diphtheria, and acellular pertussis (Tdap/Td) vaccine.** / Consult your health care provider. 1 dose of Td every 10 years.  Varicella vaccine.** / Consult your health care provider.  Zoster vaccine.** / 1 dose for adults aged 38 years or older.  Measles, mumps, rubella (MMR) vaccine.** / You need at least 1 dose of MMR if you were born in 1957 or later. You may also need a second dose.  Pneumococcal 13-valent conjugate (PCV13) vaccine.** / Consult your health care provider.  Pneumococcal polysaccharide (PPSV23) vaccine.** / 1 to 2 doses if you smoke cigarettes or if you have certain conditions.  Meningococcal vaccine.** / Consult your health care provider.  Hepatitis A vaccine.** / Consult your health care provider.  Hepatitis B vaccine.** / Consult your health care provider.  Haemophilus influenzae type b (Hib) vaccine.** / Consult your health care provider.  Abdominal  aortic aneurysm (AAA) screening for persons with hypertension or who are current or former smokers

## 2013-11-25 LAB — MICROALBUMIN / CREATININE URINE RATIO
Creatinine, Urine: 192.1 mg/dL
MICROALB/CREAT RATIO: 7.3 mg/g (ref 0.0–30.0)
Microalb, Ur: 1.4 mg/dL (ref <2.0)

## 2013-11-25 LAB — BASIC METABOLIC PANEL WITH GFR
BUN: 14 mg/dL (ref 6–23)
CHLORIDE: 98 meq/L (ref 96–112)
CO2: 31 mEq/L (ref 19–32)
Calcium: 9.9 mg/dL (ref 8.4–10.5)
Creat: 1.19 mg/dL (ref 0.50–1.35)
GFR, EST AFRICAN AMERICAN: 74 mL/min
GFR, EST NON AFRICAN AMERICAN: 64 mL/min
Glucose, Bld: 91 mg/dL (ref 70–99)
POTASSIUM: 4 meq/L (ref 3.5–5.3)
Sodium: 139 mEq/L (ref 135–145)

## 2013-11-25 LAB — PSA: PSA: 4.07 ng/mL — AB (ref <=4.00)

## 2013-11-25 LAB — HEPATIC FUNCTION PANEL
ALK PHOS: 77 U/L (ref 39–117)
ALT: 23 U/L (ref 0–53)
AST: 23 U/L (ref 0–37)
Albumin: 4.2 g/dL (ref 3.5–5.2)
BILIRUBIN TOTAL: 0.4 mg/dL (ref 0.2–1.2)
Bilirubin, Direct: 0.1 mg/dL (ref 0.0–0.3)
Indirect Bilirubin: 0.3 mg/dL (ref 0.2–1.2)
TOTAL PROTEIN: 6.9 g/dL (ref 6.0–8.3)

## 2013-11-25 LAB — INSULIN, FASTING: Insulin fasting, serum: 46.3 u[IU]/mL — ABNORMAL HIGH (ref 2.0–19.6)

## 2013-11-25 LAB — LIPID PANEL
CHOLESTEROL: 154 mg/dL (ref 0–200)
HDL: 43 mg/dL (ref >39)
LDL CALC: 58 mg/dL (ref 0–99)
TRIGLYCERIDES: 265 mg/dL — AB (ref <150)
Total CHOL/HDL Ratio: 3.6 Ratio
VLDL: 53 mg/dL — ABNORMAL HIGH (ref 0–40)

## 2013-11-25 LAB — URINALYSIS, MICROSCOPIC ONLY
Bacteria, UA: NONE SEEN
CRYSTALS: NONE SEEN
Casts: NONE SEEN
RBC / HPF: >50 RBC/hpf — AB (ref <3)
SQUAMOUS EPITHELIAL / LPF: NONE SEEN

## 2013-11-25 LAB — HEMOGLOBIN A1C
Hgb A1c MFr Bld: 6.1 % — ABNORMAL HIGH (ref <5.7)
MEAN PLASMA GLUCOSE: 128 mg/dL — AB (ref <117)

## 2013-11-25 LAB — MAGNESIUM: Magnesium: 1.8 mg/dL (ref 1.5–2.5)

## 2013-11-25 LAB — VITAMIN D 25 HYDROXY (VIT D DEFICIENCY, FRACTURES): Vit D, 25-Hydroxy: 88 ng/mL (ref 30–89)

## 2013-11-25 LAB — TSH: TSH: 1.605 u[IU]/mL (ref 0.350–4.500)

## 2013-11-25 LAB — TESTOSTERONE: Testosterone: 337 ng/dL (ref 300–890)

## 2013-11-25 LAB — VITAMIN B12: VITAMIN B 12: 938 pg/mL — AB (ref 211–911)

## 2013-12-22 LAB — COLOGUARD: COLOGUARD: NEGATIVE

## 2013-12-22 LAB — FECAL OCCULT BLOOD, GUAIAC: FECAL OCCULT BLD: NEGATIVE

## 2013-12-23 ENCOUNTER — Ambulatory Visit (INDEPENDENT_AMBULATORY_CARE_PROVIDER_SITE_OTHER): Payer: BC Managed Care – PPO | Admitting: *Deleted

## 2013-12-23 DIAGNOSIS — Z23 Encounter for immunization: Secondary | ICD-10-CM

## 2013-12-30 LAB — COLOGUARD

## 2014-02-04 ENCOUNTER — Other Ambulatory Visit: Payer: Self-pay | Admitting: *Deleted

## 2014-02-04 MED ORDER — BISOPROLOL-HYDROCHLOROTHIAZIDE 10-6.25 MG PO TABS: ORAL_TABLET | ORAL | Status: DC

## 2014-02-04 MED ORDER — LOSARTAN POTASSIUM 100 MG PO TABS: 100.0000 mg | ORAL_TABLET | Freq: Every day | ORAL | Status: DC

## 2014-02-24 ENCOUNTER — Encounter: Payer: Self-pay | Admitting: Physician Assistant

## 2014-02-24 ENCOUNTER — Ambulatory Visit: Payer: Self-pay | Admitting: Physician Assistant

## 2014-02-24 ENCOUNTER — Ambulatory Visit (INDEPENDENT_AMBULATORY_CARE_PROVIDER_SITE_OTHER): Payer: BC Managed Care – PPO | Admitting: Physician Assistant

## 2014-02-24 VITALS — BP 140/86 | HR 66 | Temp 98.2°F | Resp 16 | Ht 71.0 in | Wt 270.0 lb

## 2014-02-24 DIAGNOSIS — E538 Deficiency of other specified B group vitamins: Secondary | ICD-10-CM

## 2014-02-24 DIAGNOSIS — E559 Vitamin D deficiency, unspecified: Secondary | ICD-10-CM

## 2014-02-24 DIAGNOSIS — R7303 Prediabetes: Secondary | ICD-10-CM

## 2014-02-24 DIAGNOSIS — N182 Chronic kidney disease, stage 2 (mild): Secondary | ICD-10-CM

## 2014-02-24 DIAGNOSIS — I10 Essential (primary) hypertension: Secondary | ICD-10-CM

## 2014-02-24 DIAGNOSIS — Z79899 Other long term (current) drug therapy: Secondary | ICD-10-CM

## 2014-02-24 DIAGNOSIS — R7309 Other abnormal glucose: Secondary | ICD-10-CM

## 2014-02-24 DIAGNOSIS — E785 Hyperlipidemia, unspecified: Secondary | ICD-10-CM

## 2014-02-24 LAB — CBC WITH DIFFERENTIAL/PLATELET
BASOS ABS: 0.1 10*3/uL (ref 0.0–0.1)
Basophils Relative: 1 % (ref 0–1)
EOS ABS: 0.4 10*3/uL (ref 0.0–0.7)
EOS PCT: 5 % (ref 0–5)
HCT: 42.6 % (ref 39.0–52.0)
Hemoglobin: 15.2 g/dL (ref 13.0–17.0)
Lymphocytes Relative: 29 % (ref 12–46)
Lymphs Abs: 2.4 10*3/uL (ref 0.7–4.0)
MCH: 30.8 pg (ref 26.0–34.0)
MCHC: 35.7 g/dL (ref 30.0–36.0)
MCV: 86.2 fL (ref 78.0–100.0)
MONO ABS: 0.8 10*3/uL (ref 0.1–1.0)
MPV: 9.6 fL (ref 9.4–12.4)
Monocytes Relative: 10 % (ref 3–12)
Neutro Abs: 4.5 10*3/uL (ref 1.7–7.7)
Neutrophils Relative %: 55 % (ref 43–77)
PLATELETS: 236 10*3/uL (ref 150–400)
RBC: 4.94 MIL/uL (ref 4.22–5.81)
RDW: 13.3 % (ref 11.5–15.5)
WBC: 8.2 10*3/uL (ref 4.0–10.5)

## 2014-02-24 NOTE — Patient Instructions (Addendum)
Continue medications as prescribed. Take 2 baby aspirins daily  Please follow up in 3 months- 06/02/14   Please take Coricidin for cold or flu symptoms.     GIVE PT FOOD CHOICE LISTS FOR MEAL PLANNING  1)The amount of food you eat is important  -Too much can increase glucose levels and cause you to gain weight (which can  also increase glucose levels.  -Too little can decrease glucose level to unsafe levels (<70) 2)Eat meals and snacks at the same time each day to help your diabetes medication help you.  If you eat at different times each day, then the medication will not be as effective. 3)Do NOT skip meals or eat meals later than usual.  If you skip meals, then your glucose level can go low (<70).  Eating meals later than usual will not help the medication work effectively. 4) Amount of carbohydrates (carbs) per meal  -Breakfast- 30-45 grams of carbs  -Lunch and Dinner- 45-60 grams of carbs  -Snacks- 15-30 grams of carbs 5)Low fat foods have no more than 3 grams of fat per serving.  -Saturated- look for less than 1 gram per serving  -Trans Fat- look for 0 grams per serving 6)Exercise at least 120 minutes per week  Exercise Benefits:   -Lower LDL (Bad cholesterol)   -Lower blood pressure   -Increase HDL (Good cholesterol)   -Strengthen heart, lungs and muscles   -Burn calories and relieve stress   -Sleep better and help you feel better overall  To lower your risk of heart disease, limit your intake of saturated fat and trans fat as much as possible. THE GOOD WHAT IT DOES WHERE IT'S FOUND  MONOUNSATURATED FAT Lowers LDL and maybe raises HDL cholesterol Canola oil, olives, olive oil, peanuts, peanut oil, avocados, nuts  POLYUNSATURATED FAT Lowers LDL cholesterol Corn, safflower, sunflower and soybean oils, nuts, seeds  OMEGA-3 FATTY ACIDS Lowers triglycerides (blood fats) and blood pressure Salmon, mackerel, herring, sardines, flax seed, flaxseed oil, walnuts, soybean oil  THE BAD  WHAT IT DOES WHERE IT'S FOUND  SATURATED FAT Raises LDL (bad) cholesterol Butter, shortening, lard, red meat, cheese, whole milk, ice cream, coconut and palm oils  TRANS FAT Raises LDL cholesterol, lowers HDL (good) cholesterol Fried foods, some stick margarines, some cookies and crackers (look for hydrogenated fat on the ingredient list)  CHOLESTEROL FROM FOOD Too much may raise cholesterol levels Meat, poultry, seafood, eggs, milk, cheese, yogurt, butter   Plate Method (How much food of each food group) 1) Fill one half of your plate with nonstarchy vegetables: lettuce, broccoli, green beans, spinach, carrots or peppers. 2) Fill one quarter with protein: chicken, [place], fish, lean meat, eggs or tofu. 3) Fill one quarter with a nutritious carbohydrate food: brown rice, whole-wheat pasta, whole-wheat bread, peas, or corn.  Choose whole-wheat carbs for extra nutrition.  Controlling carbs helps you control your blood glucose. 4) Include a small piece of fruit at each meal, as well as 8 ounces of lowfat milk or yogurt. 5) Add 1-2 teaspoons of heart-healthy fat, such as olive or canola oil, trans fat-free margarine, avocado, nuts or seeds.

## 2014-02-24 NOTE — Progress Notes (Signed)
Assessment and Plan:  1. Essential hypertension Continue Ziac and Cozaar as prescribed.  Monitor blood pressure at home.  Reminder to go to the ER if any CP, SOB, nausea, dizziness, severe HA, changes vision/speech, left arm numbness and tingling and jaw pain. - CBC with Differential - BASIC METABOLIC PANEL WITH GFR - Hepatic function panel  2. Hyperlipemia Please follow recommended diet (low carb, lots of vegetables, white meat like [place] or chicken) and exercise (120 minutes per week).  Check Cholesterol.  Might start on Fish Oil if Triglycerides are still elevated. - Lipid panel  3. Prediabetes Please follow recommended diet and exercise.  Try drinking diet soda instead of regular soda and then try to not drink soda anymore.  Check A1C and Insulin levels. - Hemoglobin A1c - Insulin, fasting  4. Vitamin D deficiency Continue Vitamin D 5,000 IU daily.  Check Vitamin D level. - Vit D  25 hydroxy   5. Vitamin B12 deficiency Continue Vitamin B12 daily.  Check vitamin B12 level. - Vitamin B12  6. CKD Stage II (GFR 64 ml/min) Will monitor kidney function.  Please make sure you are drinking plenty of water to stay hydrated.  7. Encounter for long-term (current) use of medications Will monitor kidney and liver function - CBC with Differential - BASIC METABOLIC PANEL WITH GFR - Hepatic function panel  Take 2 baby Aspirin a day for prevention of blood clots. Continue diet and meds as discussed. Further disposition pending results of labs.  Discussed medication effects and SE's.  Pt agreed to treatment plan. Please keep your follow up appt on 06/02/14 with Dr. Melford Aase.   HPI A Caucasian 63 y.o. male  presents for 3 month follow up with hypertension, hyperlipidemia, prediabetes and vitamin D.  He states he did stop taking Xarelto like Dr. Melford Aase stated at his last visit on 11/24/13.  Patient states he is wearing compression stocking on left leg more than the right.  Patient states he is  constantly moving and walking at work and when he does get home he does sit down with feet elevated.  Patient states he is taking vitamin B12 every day.  His blood pressure has been controlled at home, today their BP is BP: 140/86 mmHg.  Patient is taking Ziac 10-6.25mg  daily and Cozaar 100mg  daily.  He does not workout, but does a lot of walking all day long with job. He denies chest pain, shortness of breath, dizziness.   He is not on cholesterol medication and denies myalgias. His cholesterol is not at goal. The cholesterol last visit was:   Lab Results  Component Value Date   CHOL 154 11/24/2013   HDL 43 11/24/2013   LDLCALC 58 11/24/2013   TRIG 265* 11/24/2013   CHOLHDL 3.6 11/24/2013   He has not been working on diet and exercise for prediabetes, and denies increased appetite, polydipsia and polyuria. Last A1C in the office was:  Lab Results  Component Value Date   HGBA1C 6.1* 11/24/2013  Patient had one A1C in 2012 that was at 6.5, but since then his A1C levels have been in prediabetic range. Number of meals per day?  3   B- toast, cereal  L- salad, no sandwiches  D- meat (chicken), little red meat, vegetable  Beverages? Water, no alcohol, sweet tea. 1 cup coffee in morning, 3 regular sodas a week.  Patient is on Vitamin D supplement.- 5,000 IU daily Lab Results  Component Value Date   VD25OH 88 11/24/2013  Current Medications:  Current Outpatient Prescriptions on File Prior to Visit  Medication Sig Dispense Refill  . bisoprolol-hydrochlorothiazide (ZIAC) 10-6.25 MG per tablet TAKE 1 TABLET BY MOUTH EACH DAY FOR BLOOD PRESSURE 90 tablet 1  . Cholecalciferol (VITAMIN D3) 5000 UNITS CAPS Take 5,000 Units by mouth daily.    Marland Kitchen losartan (COZAAR) 100 MG tablet Take 1 tablet (100 mg total) by mouth daily. 90 tablet 1  . omeprazole (PRILOSEC) 40 MG capsule TAKE 1 CAPSULE BY MOUTH 2 TIMES A DAY. 60 capsule PRN  . Thiamine HCl (VITAMIN B-1 PO) Take by mouth daily. Sub-lingual      No current facility-administered medications on file prior to visit.   Medical History:  Past Medical History  Diagnosis Date  . HTN (hypertension)   . Hyperlipemia     pt denies 08/08/12  . GERD (gastroesophageal reflux disease)   . Obesity   . History of Helicobacter pylori infection   . Vitamin D deficiency   . Hemorrhoids   . Peripheral vascular disease 9/14    DVT  left lower leg   Allergies:  Allergies  Allergen Reactions  . Ace Inhibitors   . Citalopram     ROS- Review of Systems  Constitutional: Negative.   HENT: Negative.        Except patient states he is getting over sinus issues.  Eyes: Negative.   Respiratory: Negative.   Cardiovascular: Negative.  Negative for chest pain.  Gastrointestinal: Negative.   Genitourinary: Negative.   Musculoskeletal: Negative.   Skin: Negative.   Neurological: Negative.   Psychiatric/Behavioral: Negative.    Family history- Review and unchanged Social history- Review and unchanged  Physical Exam: BP 140/86 mmHg  Pulse 66  Temp(Src) 98.2 F (36.8 C) (Temporal)  Resp 16  Ht 5\' 11"  (1.803 m)  Wt 270 lb (122.471 kg)  BMI 37.67 kg/m2  SpO2 98% Wt Readings from Last 3 Encounters:  02/24/14 270 lb (122.471 kg)  11/24/13 266 lb (120.657 kg)  08/20/13 256 lb (116.121 kg)  Vitals Reviewed. General Appearance: Well nourished, in no apparent distress. Obese. Eyes: PERRLA, EOMIs, conjunctiva no swelling or erythema.  No scleral icterus. Sinuses: No Frontal/maxillary tenderness ENT/Mouth: Ext aud canals clear, TMs without erythema, edema or bulging. No erythema, swelling, or exudate on posterior pharynx.  Hearing normal.  Neck: Supple, thyroid normal.  Respiratory: Respiratory effort normal, CTAB.  No w/r/r or stridor.  Cardio: RRR with no M/R/Gs. Brisk peripheral pulses without pitting edema.  Left leg is slightly larger than the right leg, and both legs are without erythema or tenderness upon palpation. Abdomen: Soft,  round, + normal BS.  Non tender, no guarding, rebound, hernias, masses. Lymphatics: Non tender without lymphadenopathy.  Musculoskeletal: Full ROM, 5/5 strength, normal gait.  Skin: Warm, dry intact without rashes, lesions, ecchymosis.  Neuro: Cranial nerves intact. No cerebellar symptoms. Sensation intact.  Psych: Awake and oriented X 3, normal affect, Insight and Judgment appropriate.    Payal Stanforth, Stephani Police, PA-C 3:47 PM [facility]

## 2014-02-25 LAB — LIPID PANEL
CHOL/HDL RATIO: 3.9 ratio
CHOLESTEROL: 152 mg/dL (ref 0–200)
HDL: 39 mg/dL — ABNORMAL LOW (ref >39)
LDL CALC: 72 mg/dL (ref 0–99)
TRIGLYCERIDES: 203 mg/dL — AB (ref <150)
VLDL: 41 mg/dL — AB (ref 0–40)

## 2014-02-25 LAB — BASIC METABOLIC PANEL WITH GFR
BUN: 18 mg/dL (ref 6–23)
CALCIUM: 9.9 mg/dL (ref 8.4–10.5)
CO2: 31 mEq/L (ref 19–32)
CREATININE: 1.01 mg/dL (ref 0.50–1.35)
Chloride: 100 mEq/L (ref 96–112)
GFR, EST NON AFRICAN AMERICAN: 78 mL/min
Glucose, Bld: 92 mg/dL (ref 70–99)
Potassium: 4.2 mEq/L (ref 3.5–5.3)
Sodium: 141 mEq/L (ref 135–145)

## 2014-02-25 LAB — HEPATIC FUNCTION PANEL
ALBUMIN: 4 g/dL (ref 3.5–5.2)
ALT: 24 U/L (ref 0–53)
AST: 20 U/L (ref 0–37)
Alkaline Phosphatase: 81 U/L (ref 39–117)
Bilirubin, Direct: 0.1 mg/dL (ref 0.0–0.3)
Indirect Bilirubin: 0.2 mg/dL (ref 0.2–1.2)
TOTAL PROTEIN: 6.7 g/dL (ref 6.0–8.3)
Total Bilirubin: 0.3 mg/dL (ref 0.2–1.2)

## 2014-02-25 LAB — INSULIN, FASTING: INSULIN FASTING, SERUM: 26.2 u[IU]/mL — AB (ref 2.0–19.6)

## 2014-02-25 LAB — HEMOGLOBIN A1C
Hgb A1c MFr Bld: 6.2 % — ABNORMAL HIGH (ref <5.7)
Mean Plasma Glucose: 131 mg/dL — ABNORMAL HIGH (ref <117)

## 2014-02-25 LAB — VITAMIN D 25 HYDROXY (VIT D DEFICIENCY, FRACTURES): Vit D, 25-Hydroxy: 68 ng/mL (ref 30–100)

## 2014-02-25 LAB — VITAMIN B12: Vitamin B-12: 1156 pg/mL — ABNORMAL HIGH (ref 211–911)

## 2014-03-05 ENCOUNTER — Encounter: Payer: Self-pay | Admitting: *Deleted

## 2014-06-02 ENCOUNTER — Ambulatory Visit (INDEPENDENT_AMBULATORY_CARE_PROVIDER_SITE_OTHER): Payer: Managed Care, Other (non HMO) | Admitting: Internal Medicine

## 2014-06-02 ENCOUNTER — Encounter: Payer: Self-pay | Admitting: Internal Medicine

## 2014-06-02 VITALS — BP 126/78 | HR 76 | Temp 97.6°F | Resp 16 | Ht 71.0 in | Wt 272.6 lb

## 2014-06-02 DIAGNOSIS — E559 Vitamin D deficiency, unspecified: Secondary | ICD-10-CM

## 2014-06-02 DIAGNOSIS — R7303 Prediabetes: Secondary | ICD-10-CM

## 2014-06-02 DIAGNOSIS — E785 Hyperlipidemia, unspecified: Secondary | ICD-10-CM

## 2014-06-02 DIAGNOSIS — N182 Chronic kidney disease, stage 2 (mild): Secondary | ICD-10-CM

## 2014-06-02 DIAGNOSIS — I10 Essential (primary) hypertension: Secondary | ICD-10-CM

## 2014-06-02 DIAGNOSIS — R7309 Other abnormal glucose: Secondary | ICD-10-CM

## 2014-06-02 DIAGNOSIS — Z79899 Other long term (current) drug therapy: Secondary | ICD-10-CM

## 2014-06-02 LAB — CBC WITH DIFFERENTIAL/PLATELET
BASOS ABS: 0 10*3/uL (ref 0.0–0.1)
BASOS PCT: 0 % (ref 0–1)
EOS ABS: 0.4 10*3/uL (ref 0.0–0.7)
Eosinophils Relative: 5 % (ref 0–5)
HEMATOCRIT: 43.9 % (ref 39.0–52.0)
Hemoglobin: 15.3 g/dL (ref 13.0–17.0)
Lymphocytes Relative: 30 % (ref 12–46)
Lymphs Abs: 2.4 10*3/uL (ref 0.7–4.0)
MCH: 30.9 pg (ref 26.0–34.0)
MCHC: 34.9 g/dL (ref 30.0–36.0)
MCV: 88.7 fL (ref 78.0–100.0)
MONOS PCT: 9 % (ref 3–12)
MPV: 10 fL (ref 8.6–12.4)
Monocytes Absolute: 0.7 10*3/uL (ref 0.1–1.0)
Neutro Abs: 4.5 10*3/uL (ref 1.7–7.7)
Neutrophils Relative %: 56 % (ref 43–77)
Platelets: 234 10*3/uL (ref 150–400)
RBC: 4.95 MIL/uL (ref 4.22–5.81)
RDW: 13.7 % (ref 11.5–15.5)
WBC: 8 10*3/uL (ref 4.0–10.5)

## 2014-06-02 NOTE — Progress Notes (Signed)
Patient ID: Jason Patton, male   DOB: [birth year], 64 y.o.   MRN: [id number]   This very nice 64 y.o. MWM presents for 3 month follow up with Hypertension, Hyperlipidemia, Pre-Diabetes and Vitamin D Deficiency.    Patient is treated for HTN since 2009 & BP has been controlled and today's BP: 126/78 mmHg. Patient has had no complaints of any cardiac type chest pain, palpitations, dyspnea/orthopnea/PND, dizziness, claudication, or dependent edema.   Hyperlipidemia is controlled with diet & meds. Patient denies myalgias or other med SE's. Last Lipids were at goal - Total Chol152; HDL 39; LDL  72; Trig  203 on 02/24/2014.   Also, the patient has history of Morbid Obesity (BMI 38.41) and consequent PreDiabetes since 2010  and has had no symptoms of reactive hypoglycemia, diabetic polys, paresthesias or visual blurring.  Last A1c was  6.2% on  02/24/2014.   Further, the patient also has history of Vitamin D Deficiency of 32 in 2009 and supplements vitamin D without any suspected side-effects. Last vitamin D was  68 on 02/24/2014.  Medication Sig  . bisoprolol-hctz (ZIAC) 10-6.25 MG per tablet TAKE 1 TABLET BY MOUTH EACH DAY FOR BLOOD PRESSURE  . VITAMIN D 5000 UNITS CAPS Take 5,000 Units by mouth daily.  Marland Kitchen losartan  100 MG tablet Take 1 tablet (100 mg total) by mouth daily.  Marland Kitchen omeprazole (PRILOSEC) 40 MG capsule TAKE 1 CAPSULE BY MOUTH 2 TIMES A DAY.  . vitamin B-12  50 MCG tablet Take 50 mcg by mouth daily.  . Thiamine  (VITAMIN B-1 ) Take by mouth daily. Sub-lingual   Allergies  Allergen Reactions  . Ace Inhibitors   . Citalopram    PMHx:   Past Medical History  Diagnosis Date  . HTN (hypertension)   . Hyperlipemia     pt denies 08/08/12  . GERD (gastroesophageal reflux disease)   . Obesity   . History of Helicobacter pylori infection   . Vitamin D deficiency   . Hemorrhoids   . Peripheral vascular disease 9/14    DVT  left lower leg   Immunization History  Administered Date(s)  Administered  . Influenza Split 12/23/2013  . Pneumococcal Conjugate-13 12/01/2008   Past Surgical History  Procedure Laterality Date  . Hemorrhoid surgery      age 64  . Tonsillectomy    . Cystoscopy/retrograde/ureteroscopy Left 01/02/2013    Procedure: CYSTOSCOPY/LEFT URETERAL WASHINGS/LEFT RETROGRADE PYELOGRAM/ LEFT URETEROSCOPY/URETERAL BIOPSY. BLADDER BIOPSY;  Surgeon: Ardis Hughs, MD;  Location: [facility];  Service: Urology;  Laterality: Left;  With STENT   FHx:    Reviewed / unchanged  SHx:    Reviewed / unchanged  Systems Review:  Constitutional: Denies fever, chills, wt changes, headaches, insomnia, fatigue, night sweats, change in appetite. Eyes: Denies redness, blurred vision, diplopia, discharge, itchy, watery eyes.  ENT: Denies discharge, congestion, post nasal drip, epistaxis, sore throat, earache, hearing loss, dental pain, tinnitus, vertigo, sinus pain, snoring.  CV: Denies chest pain, palpitations, irregular heartbeat, syncope, dyspnea, diaphoresis, orthopnea, PND, claudication or edema. Respiratory: denies cough, dyspnea, DOE, pleurisy, hoarseness, laryngitis, wheezing.  Gastrointestinal: Denies dysphagia, odynophagia, heartburn, reflux, water brash, abdominal pain or cramps, nausea, vomiting, bloating, diarrhea, constipation, hematemesis, melena, hematochezia  or hemorrhoids. Genitourinary: Denies dysuria, frequency, urgency, nocturia, hesitancy, discharge, hematuria or flank pain. Musculoskeletal: Denies arthralgias, myalgias, stiffness, jt. swelling, pain, limping or strain/sprain.  Skin: Denies pruritus, rash, hives, warts, acne, eczema or change in skin lesion(s). Neuro: No weakness, tremor, incoordination, spasms, paresthesia  or pain. Psychiatric: Denies confusion, memory loss or sensory loss. Endo: Denies change in weight, skin or hair change.  Heme/Lymph: No excessive bleeding, bruising or enlarged lymph nodes.  Physical Exam  BP 126/78   Pulse 76   Temp 97.6 F Resp 16  Ht 5\' 11"    Wt 272 lb 9.6 oz     BMI 38.04   Appears well nourished and in no distress. Eyes: PERRLA, EOMs, conjunctiva no swelling or erythema. Sinuses: No frontal/maxillary tenderness ENT/Mouth: EAC's clear, TM's nl w/o erythema, bulging. Nares clear w/o erythema, swelling, exudates. Oropharynx clear without erythema or exudates. Oral hygiene is good. Tongue normal, non obstructing. Hearing intact.  Neck: Supple. Thyroid nl. Car 2+/2+ without bruits, nodes or JVD. Chest: Respirations nl with BS clear & equal w/o rales, rhonchi, wheezing or stridor.  Cor: Heart sounds normal w/ regular rate and rhythm without sig. murmurs, gallops, clicks, or rubs. Peripheral pulses normal and equal  without edema.  Abdomen: Soft & bowel sounds normal. Non-tender w/o guarding, rebound, hernias, masses, or organomegaly.  Lymphatics: Unremarkable.  Musculoskeletal: Full ROM all peripheral extremities, joint stability, 5/5 strength, and normal gait.  Skin: Warm, dry without exposed rashes, lesions or ecchymosis apparent.  Neuro: Cranial nerves intact, reflexes equal bilaterally. Sensory-motor testing grossly intact. Tendon reflexes grossly intact.  Pysch: Alert & oriented x 3.  Insight and judgement nl & appropriate. No ideations.  Assessment and Plan:  1. Essential hypertension  - TSH  2. Hyperlipemia  - Lipid panel  3. Prediabetes  - Hemoglobin A1c - Insulin, random  4. Vitamin D deficiency  - Vit D  25 hydroxy (rtn osteoporosis monitoring)  5. CKD Stage II (GFR 64 ml/min)   6. Encounter for long-term (current) use of medications  - CBC with Differential/Platelet - BASIC METABOLIC PANEL WITH GFR - Hepatic function panel - Magnesium   Recommended regular exercise, BP monitoring, weight control, and discussed med and SE's. Recommended labs to assess and monitor clinical status. Further disposition pending results of labs. Over 30 minutes of exam, counseling, chart  review was performed

## 2014-06-02 NOTE — Patient Instructions (Signed)

## 2014-06-03 LAB — BASIC METABOLIC PANEL WITH GFR
BUN: 19 mg/dL (ref 6–23)
CO2: 31 mEq/L (ref 19–32)
Calcium: 9.5 mg/dL (ref 8.4–10.5)
Chloride: 99 mEq/L (ref 96–112)
Creat: 1.3 mg/dL (ref 0.50–1.35)
GFR, Est African American: 66 mL/min
GFR, Est Non African American: 57 mL/min — ABNORMAL LOW
Glucose, Bld: 106 mg/dL — ABNORMAL HIGH (ref 70–99)
Potassium: 4.1 mEq/L (ref 3.5–5.3)
SODIUM: 138 meq/L (ref 135–145)

## 2014-06-03 LAB — HEMOGLOBIN A1C
Hgb A1c MFr Bld: 6.5 % — ABNORMAL HIGH
Mean Plasma Glucose: 140 mg/dL — ABNORMAL HIGH

## 2014-06-03 LAB — LIPID PANEL
Cholesterol: 154 mg/dL (ref 0–200)
HDL: 38 mg/dL — ABNORMAL LOW (ref >=40)
LDL Cholesterol: 75 mg/dL (ref 0–99)
Total CHOL/HDL Ratio: 4.1 Ratio
Triglycerides: 206 mg/dL — ABNORMAL HIGH (ref <150)
VLDL: 41 mg/dL — AB (ref 0–40)

## 2014-06-03 LAB — VITAMIN D 25 HYDROXY (VIT D DEFICIENCY, FRACTURES): Vit D, 25-Hydroxy: 62 ng/mL (ref 30–100)

## 2014-06-03 LAB — HEPATIC FUNCTION PANEL
ALBUMIN: 4.1 g/dL (ref 3.5–5.2)
ALT: 26 U/L (ref 0–53)
AST: 22 U/L (ref 0–37)
Alkaline Phosphatase: 79 U/L (ref 39–117)
BILIRUBIN DIRECT: 0.1 mg/dL (ref 0.0–0.3)
BILIRUBIN TOTAL: 0.4 mg/dL (ref 0.2–1.2)
Indirect Bilirubin: 0.3 mg/dL (ref 0.2–1.2)
TOTAL PROTEIN: 6.9 g/dL (ref 6.0–8.3)

## 2014-06-03 LAB — INSULIN, RANDOM: Insulin: 52.3 u[IU]/mL — ABNORMAL HIGH (ref 2.0–19.6)

## 2014-06-03 LAB — MAGNESIUM: Magnesium: 1.9 mg/dL (ref 1.5–2.5)

## 2014-06-03 LAB — TSH: TSH: 1.504 u[IU]/mL (ref 0.350–4.500)

## 2014-08-16 ENCOUNTER — Other Ambulatory Visit: Payer: Self-pay | Admitting: Internal Medicine

## 2014-09-02 ENCOUNTER — Encounter: Payer: Self-pay | Admitting: Internal Medicine

## 2014-09-02 ENCOUNTER — Ambulatory Visit (INDEPENDENT_AMBULATORY_CARE_PROVIDER_SITE_OTHER): Payer: Managed Care, Other (non HMO) | Admitting: Internal Medicine

## 2014-09-02 ENCOUNTER — Ambulatory Visit: Payer: Self-pay | Admitting: Internal Medicine

## 2014-09-02 VITALS — BP 128/84 | HR 74 | Temp 98.2°F | Resp 16 | Ht 71.0 in | Wt 276.0 lb

## 2014-09-02 DIAGNOSIS — E785 Hyperlipidemia, unspecified: Secondary | ICD-10-CM

## 2014-09-02 DIAGNOSIS — N182 Chronic kidney disease, stage 2 (mild): Secondary | ICD-10-CM

## 2014-09-02 DIAGNOSIS — R7303 Prediabetes: Secondary | ICD-10-CM

## 2014-09-02 DIAGNOSIS — R7309 Other abnormal glucose: Secondary | ICD-10-CM

## 2014-09-02 DIAGNOSIS — E559 Vitamin D deficiency, unspecified: Secondary | ICD-10-CM

## 2014-09-02 DIAGNOSIS — I10 Essential (primary) hypertension: Secondary | ICD-10-CM

## 2014-09-02 DIAGNOSIS — Z79899 Other long term (current) drug therapy: Secondary | ICD-10-CM

## 2014-09-02 NOTE — Patient Instructions (Signed)
We want weight loss that will last so you should lose 1-2 pounds a week.  THAT IS IT! Please pick THREE things a month to change. Once it is a habit check off the item. Then pick another three items off the list to become habits.  If you are already doing a habit on the list GREAT!  Cross that item off! o Don't drink your calories. Ie, alcohol, soda, fruit juice, and sweet tea.  o Drink more water. Drink a glass when you feel hungry or before each meal.  o Eat breakfast - Complex carb and protein (likeDannon light and fit yogurt, oatmeal, fruit, eggs, turkey bacon). o Measure your cereal.  Eat no more than one cup a day. (ie Kashi) o Eat an apple a day. o Add a vegetable a day. o Try a new vegetable a month. o Use Pam! Stop using oil or butter to cook. o Don't finish your plate or use smaller plates. o Share your dessert. o Eat sugar free Jello for dessert or frozen grapes. o Don't eat 2-3 hours before bed. o Switch to whole wheat bread, pasta, and brown rice. o Make healthier choices when you eat out. No fries! o Pick baked chicken, NOT fried. o Don't forget to SLOW DOWN when you eat. It is not going anywhere.  o Take the stairs. o Park far away in the parking lot o Lift soup cans (or weights) for 10 minutes while watching TV. o Walk at work for 10 minutes during break. o Walk outside 1 time a week with your friend, kids, dog, or significant other. o Start a walking group at church. o Walk the mall as much as you can tolerate.  o Keep a food diary. o Weigh yourself daily. o Walk for 15 minutes 3 days per week. o Cook at home more often and eat out less.  If life happens and you go back to old habits, it is okay.  Just start over. You can do it!   If you experience chest pain, get short of breath, or tired during the exercise, please stop immediately and inform your doctor.   Ways to cut 100 calories  1. Eat your eggs with hot sauce OR salsa instead of cheese.  Eggs are great for  breakfast, but many people consider eggs and cheese to be BFFs. Instead of cheese-1 oz. of cheddar has 114 calories-top your eggs with hot sauce, which contains no calories and helps with satiety and metabolism. Salsa is also a great option!!  2. Top your toast, waffles or pancakes with mashed berries instead of jelly or syrup. Half a cup of berries-fresh, frozen or thawed-has about 40 calories, compared with 2 tbsp. of maple syrup or jelly, which both have about 100 calories. The berries will also give you a good punch of fiber, which helps keep you full and satisfied and won't spike blood sugar quickly like the jelly or syrup. 3. Swap the non-fat latte for black coffee with a splash of half-and-half. Contrary to its name, that non-fat latte has 130 calories and a startling 19g of carbohydrates per 16 oz. serving. Replacing that 'light' drinkable dessert with a black coffee with a splash of half-and-half saves you more than 100 calories per 16 oz. serving. 4. Sprinkle salads with freeze-dried raspberries instead of dried cranberries. If you want a sweet addition to your nutritious salad, stay away from dried cranberries. They have a whopping 130 calories per  cup and 30g carbohydrates. Instead, sprinkle   freeze-dried raspberries guilt-free and save more than 100 calories per  cup serving, adding 3g of belly-filling fiber. 5. Go for mustard in place of mayo on your sandwich. Mustard can add really nice flavor to any sandwich, and there are tons of varieties, from spicy to honey. A serving of mayo is 95 calories, versus 10 calories in a serving of mustard. 6. Choose a DIY salad dressing instead of the store-bought kind. Mix Dijon or whole grain mustard with low-fat Kefir or red wine vinegar and garlic. 7. Use hummus as a spread instead of a dip. Use hummus as a spread on a high-fiber cracker or tortilla with a sandwich and save on calories without sacrificing taste. 8. Pick just one salad  "accessory." Salad isn't automatically a calorie winner. It's easy to over-accessorize with toppings. Instead of topping your salad with nuts, avocado and cranberries (all three will clock in at 313 calories), just pick one. The next day, choose a different accessory, which will also keep your salad interesting. You don't wear all your jewelry every day, right? 9. Ditch the white pasta in favor of spaghetti squash. One cup of cooked spaghetti squash has about 40 calories, compared with traditional spaghetti, which comes with more than 200. Spaghetti squash is also nutrient-dense. It's a good source of fiber and Vitamins A and C, and it can be eaten just like you would eat pasta-with a great tomato sauce and turkey meatballs or with pesto, tofu and spinach, for example. 10. Dress up your chili, soups and stews with non-fat Greek yogurt instead of sour cream. Just a 'dollop' of sour cream can set you back 115 calories and a whopping 12g of fat-seven of which are of the artery-clogging variety. Added bonus: Greek yogurt is packed with muscle-building protein, calcium and B Vitamins. 11. Mash cauliflower instead of mashed potatoes. One cup of traditional mashed potatoes-in all their creamy goodness-has more than 200 calories, compared to mashed cauliflower, which you can typically eat for less than 100 calories per 1 cup serving. Cauliflower is a great source of the antioxidant indole-3-carbinol (I3C), which may help reduce the risk of some cancers, like breast cancer. 12. Ditch the ice cream sundae in favor of a Greek yogurt parfait. Instead of a cup of ice cream or fro-yo for dessert, try 1 cup of nonfat Greek yogurt topped with fresh berries and a sprinkle of cacao nibs. Both toppings are packed with antioxidants, which can help reduce cellular inflammation and oxidative damage. And the comparison is a no-brainer: One cup of ice cream has about 275 calories; one cup of frozen yogurt has about 230; and a cup  of Greek yogurt has just 130, plus twice the protein, so you're less likely to return to the freezer for a second helping. 13. Put olive oil in a spray container instead of using it directly from the bottle. Each tablespoon of olive oil is 120 calories and 15g of fat. Use a mister instead of pouring it straight into the pan or onto a salad. This allows for portion control and will save you more than 100 calories. 14. When baking, substitute canned pumpkin for butter or oil. Canned pumpkin-not pumpkin pie mix-is loaded with Vitamin A, which is important for skin and eye health, as well as immunity. And the comparisons are pretty crazy:  cup of canned pumpkin has about 40 calories, compared to butter or oil, which has more than 800 calories. Yes, 800 calories. Applesauce and mashed banana can also serve as   good substitutions for butter or oil, usually in a 1:1 ratio. 15. Top casseroles with high-fiber cereal instead of breadcrumbs. Breadcrumbs are typically made with white bread, while breakfast cereals contain 5-9g of fiber per serving. Not only will you save more than 150 calories per  cup serving, the swap will also keep you more full and you'll get a metabolism boost from the added fiber. 16. Snack on pistachios instead of macadamia nuts. Believe it or not, you get the same amount of calories from 35 pistachios (100 calories) as you would from only five macadamia nuts. 17. Chow down on kale chips rather than potato chips. This is my favorite 'don't knock it 'till you try it' swap. Kale chips are so easy to make at home, and you can spice them up with a little grated parmesan or chili powder. Plus, they're a mere fraction of the calories of potato chips, but with the same crunch factor we crave so often. 18. Add seltzer and some fruit slices to your cocktail instead of soda or fruit juice. One cup of soda or fruit juice can pack on as much as 140 calories. Instead, use seltzer and fruit slices. The  fruit provides valuable phytochemicals, such as flavonoids and anthocyanins, which help to combat cancer and stave off the aging process. 

## 2014-09-02 NOTE — Progress Notes (Signed)
Patient ID: Jason Patton, male   DOB: September 22, 1950, 64 y.o.   MRN: [id number]  Assessment and Plan:  Hypertension:  -Continue medication,  -monitor blood pressure at home.  -Continue DASH diet.   -Reminder to go to the ER if any CP, SOB, nausea, dizziness, severe HA, changes vision/speech, left arm numbness and tingling, and jaw pain.  Cholesterol: -Continue diet and exercise.  -Check cholesterol.   Pre-diabetes: -Continue diet and exercise.  -Check A1C  Vitamin D Def: -check level -continue medications.   Continue diet and meds as discussed. Further disposition pending results of labs.  HPI 64 y.o. male  presents for 3 month follow up with hypertension, hyperlipidemia, prediabetes and vitamin D.   His blood pressure has been controlled at home, today their BP is BP: 128/84 mmHg.   He does workout. He denies chest pain, shortness of breath, dizziness.   He is not on cholesterol medication and denies myalgias. His cholesterol is at goal. The cholesterol last visit was:   Lab Results  Component Value Date   CHOL 154 06/02/2014   HDL 38* 06/02/2014   LDLCALC 75 06/02/2014   TRIG 206* 06/02/2014   CHOLHDL 4.1 06/02/2014     He has been working on diet and exercise for prediabetes, and denies foot ulcerations, hyperglycemia, hypoglycemia , increased appetite, nausea, paresthesia of the feet, polydipsia, polyuria, visual disturbances, vomiting and weight loss. Last A1C in the office was:  Lab Results  Component Value Date   HGBA1C 6.5* 06/02/2014    Patient is on Vitamin D supplement.  Lab Results  Component Value Date   VD25OH 62 06/02/2014      Current Medications:  Current Outpatient Prescriptions on File Prior to Visit  Medication Sig Dispense Refill  . aspirin 81 MG tablet Take 81 mg by mouth daily. Takes 2 tabs daily    . bisoprolol-hydrochlorothiazide (ZIAC) 10-6.25 MG per tablet TAKE 1 TABLET BY MOUTH EACH DAY FOR BLOOD PRESSURE 90 tablet 1  . Cholecalciferol  (VITAMIN D3) 5000 UNITS CAPS Take 5,000 Units by mouth daily.    Marland Kitchen losartan (COZAAR) 100 MG tablet Take 1 tablet (100 mg total) by mouth daily. 90 tablet 1  . omeprazole (PRILOSEC) 40 MG capsule TAKE 1 CAPSULE BY MOUTH 2 TIMES A DAY. 60 capsule PRN  . vitamin B-12 (CYANOCOBALAMIN) 50 MCG tablet Take 50 mcg by mouth daily.     No current facility-administered medications on file prior to visit.    Medical History:  Past Medical History  Diagnosis Date  . HTN (hypertension)   . Hyperlipemia     pt denies 08/08/12  . GERD (gastroesophageal reflux disease)   . Obesity   . History of Helicobacter pylori infection   . Vitamin D deficiency   . Hemorrhoids   . Peripheral vascular disease 9/14    DVT  left lower leg    Allergies:  Allergies  Allergen Reactions  . Ace Inhibitors   . Citalopram      Review of Systems:  Review of Systems  Constitutional: Negative for fever, chills and malaise/fatigue.  HENT: Negative.  Negative for congestion, ear pain and sore throat.   Eyes: Negative.   Respiratory: Negative for cough, shortness of breath and wheezing.   Cardiovascular: Negative for chest pain, palpitations and leg swelling.  Gastrointestinal: Negative for heartburn, nausea, vomiting, diarrhea, constipation, blood in stool and melena.  Genitourinary: Negative.   Skin: Negative.   Neurological: Negative for dizziness, sensory change, loss of consciousness and  headaches.  Psychiatric/Behavioral: Negative for depression. The patient is not nervous/anxious and does not have insomnia.     Family history- Review and unchanged  Social history- Review and unchanged  Physical Exam: BP 128/84 mmHg  Pulse 74  Temp(Src) 98.2 F (36.8 C) (Temporal)  Resp 16  Ht 5\' 11"  (1.803 m)  Wt 276 lb (125.193 kg)  BMI 38.51 kg/m2 Wt Readings from Last 3 Encounters:  09/02/14 276 lb (125.193 kg)  06/02/14 272 lb 9.6 oz (123.651 kg)  02/24/14 270 lb (122.471 kg)    General Appearance: Well  nourished well developed, in no apparent distress. Eyes: PERRLA, EOMs, conjunctiva no swelling or erythema ENT/Mouth: Ear canals normal without obstruction, swelling, erythma, discharge.  TMs normal bilaterally.  Oropharynx moist, clear, without exudate, or postoropharyngeal swelling. Neck: Supple, thyroid normal,no cervical adenopathy  Respiratory: Respiratory effort normal, Breath sounds clear A&P without rhonchi, wheeze, or rale.  No retractions, no accessory usage. Cardio: RRR with no MRGs. Brisk peripheral pulses without edema.  Abdomen: Soft, + BS,  Non tender, no guarding, rebound, hernias, masses. Musculoskeletal: Full ROM, 5/5 strength, Normal gait Skin: Warm, dry without rashes, lesions, ecchymosis.  Neuro: Awake and oriented X 3, Cranial nerves intact. Normal muscle tone, no cerebellar symptoms. Psych: Normal affect, Insight and Judgment appropriate.    FORCUCCI, Shanik [place], PA-C 3:39 PM [facility]

## 2014-09-03 LAB — BASIC METABOLIC PANEL WITH GFR
BUN: 17 mg/dL (ref 6–23)
CO2: 29 mEq/L (ref 19–32)
Calcium: 9.5 mg/dL (ref 8.4–10.5)
Chloride: 101 mEq/L (ref 96–112)
Creat: 1.17 mg/dL (ref 0.50–1.35)
GFR, Est African American: 75 mL/min
GFR, Est Non African American: 65 mL/min
GLUCOSE: 110 mg/dL — AB (ref 70–99)
Potassium: 4.1 mEq/L (ref 3.5–5.3)
Sodium: 142 mEq/L (ref 135–145)

## 2014-09-03 LAB — HEPATIC FUNCTION PANEL
ALT: 27 U/L (ref 0–53)
AST: 20 U/L (ref 0–37)
Albumin: 4.1 g/dL (ref 3.5–5.2)
Alkaline Phosphatase: 79 U/L (ref 39–117)
BILIRUBIN DIRECT: 0.1 mg/dL (ref 0.0–0.3)
Indirect Bilirubin: 0.3 mg/dL (ref 0.2–1.2)
TOTAL PROTEIN: 6.6 g/dL (ref 6.0–8.3)
Total Bilirubin: 0.4 mg/dL (ref 0.2–1.2)

## 2014-09-03 LAB — CBC WITH DIFFERENTIAL/PLATELET
Basophils Absolute: 0 10*3/uL (ref 0.0–0.1)
Basophils Relative: 0 % (ref 0–1)
Eosinophils Absolute: 0.5 10*3/uL (ref 0.0–0.7)
Eosinophils Relative: 5 % (ref 0–5)
HCT: 46.8 % (ref 39.0–52.0)
Hemoglobin: 16.1 g/dL (ref 13.0–17.0)
Lymphocytes Relative: 26 % (ref 12–46)
Lymphs Abs: 2.7 10*3/uL (ref 0.7–4.0)
MCH: 31.2 pg (ref 26.0–34.0)
MCHC: 34.4 g/dL (ref 30.0–36.0)
MCV: 90.7 fL (ref 78.0–100.0)
MONOS PCT: 9 % (ref 3–12)
MPV: 9.7 fL (ref 8.6–12.4)
Monocytes Absolute: 0.9 10*3/uL (ref 0.1–1.0)
Neutro Abs: 6.1 10*3/uL (ref 1.7–7.7)
Neutrophils Relative %: 60 % (ref 43–77)
Platelets: 235 10*3/uL (ref 150–400)
RBC: 5.16 MIL/uL (ref 4.22–5.81)
RDW: 13.7 % (ref 11.5–15.5)
WBC: 10.2 10*3/uL (ref 4.0–10.5)

## 2014-09-03 LAB — LIPID PANEL
CHOL/HDL RATIO: 4.1 ratio
Cholesterol: 159 mg/dL (ref 0–200)
HDL: 39 mg/dL — ABNORMAL LOW (ref >=40)
LDL Cholesterol: 73 mg/dL (ref 0–99)
TRIGLYCERIDES: 236 mg/dL — AB (ref <150)
VLDL: 47 mg/dL — ABNORMAL HIGH (ref 0–40)

## 2014-09-03 LAB — INSULIN, RANDOM: Insulin: 76.9 u[IU]/mL — ABNORMAL HIGH (ref 2.0–19.6)

## 2014-09-03 LAB — VITAMIN D 25 HYDROXY (VIT D DEFICIENCY, FRACTURES): VIT D 25 HYDROXY: 72 ng/mL (ref 30–100)

## 2014-09-03 LAB — HEMOGLOBIN A1C
HEMOGLOBIN A1C: 6.1 % — AB (ref <5.7)
MEAN PLASMA GLUCOSE: 128 mg/dL — AB (ref <117)

## 2014-09-03 LAB — TSH: TSH: 0.982 u[IU]/mL (ref 0.350–4.500)

## 2014-09-03 LAB — MAGNESIUM: MAGNESIUM: 1.9 mg/dL (ref 1.5–2.5)

## 2014-09-30 ENCOUNTER — Ambulatory Visit (INDEPENDENT_AMBULATORY_CARE_PROVIDER_SITE_OTHER): Payer: Managed Care, Other (non HMO) | Admitting: Physician Assistant

## 2014-09-30 ENCOUNTER — Encounter: Payer: Self-pay | Admitting: Physician Assistant

## 2014-09-30 VITALS — BP 162/90 | HR 74 | Temp 98.4°F | Resp 18 | Ht 71.0 in | Wt 275.0 lb

## 2014-09-30 DIAGNOSIS — R110 Nausea: Secondary | ICD-10-CM

## 2014-09-30 DIAGNOSIS — R109 Unspecified abdominal pain: Secondary | ICD-10-CM

## 2014-09-30 DIAGNOSIS — N289 Disorder of kidney and ureter, unspecified: Secondary | ICD-10-CM | POA: Diagnosis not present

## 2014-09-30 MED ORDER — ONDANSETRON HCL 4 MG PO TABS: 4.0000 mg | ORAL_TABLET | Freq: Every day | ORAL | Status: DC | PRN

## 2014-09-30 NOTE — Patient Instructions (Addendum)
Increase fluids Blank food  Stop prilosec and start samples for now, can take zantac at night  Nausea, Adult Nausea is the feeling that you have an upset stomach or have to vomit. Nausea by itself is not likely a serious concern, but it may be an early sign of more serious medical problems. As nausea gets worse, it can lead to vomiting. If vomiting develops, there is the risk of dehydration.  CAUSES   Viral infections.  Food poisoning.  Medicines.  Pregnancy.  Motion sickness.  Migraine headaches.  Emotional distress.  Severe pain from any source.  Alcohol intoxication. HOME CARE INSTRUCTIONS  Get plenty of rest.  Ask your caregiver about specific rehydration instructions.  Eat small amounts of food and sip liquids more often.  Take all medicines as told by your caregiver. SEEK MEDICAL CARE IF:  You have not improved after 2 days, or you get worse.  You have a headache. SEEK IMMEDIATE MEDICAL CARE IF:   You have a fever.  You faint.  You keep vomiting or have blood in your vomit.  You are extremely weak or dehydrated.  You have dark or bloody stools.  You have severe chest or abdominal pain. MAKE SURE YOU:  Understand these instructions.  Will watch your condition.  Will get help right away if you are not doing well or get worse. Document Released: 03/29/2004 Document Revised: 11/14/2011 Document Reviewed: 11/01/2010 [place] Patient Information 2015 [place], [place]. This information is not intended to replace advice given to you by your health care provider. Make sure you discuss any questions you have with your health care provider.

## 2014-09-30 NOTE — Progress Notes (Signed)
   Subjective:    Patient ID: Jason Patton, male    DOB: Jul 20, 1950, 64 y.o.   MRN: [id number]  HPI 64 y.o. WM with history of diverticulitis, kidney stone on the left side,  HTN, preDM, CKD stage II, chol presents with nausea without vomiting. Has had nausea x Tuesday, + GERD/burping betlching, weakness, some left flank pain, without fever, chills, diarrhea, constipation, black/blood stool, no urinary symptoms. He is on the prilosec.  No NSAIDS, no alcohol. Walks at work without CP, SOB, nausea. Has had increased stress at work.   Blood pressure 162/90, pulse 74, temperature 98.4 F (36.9 C), temperature source Temporal, resp. rate 18, height 5\' 11"  (1.803 m), weight 275 lb (124.739 kg).  Current Outpatient Prescriptions on File Prior to Visit  Medication Sig Dispense Refill  . aspirin 81 MG tablet Take 81 mg by mouth daily. Takes 2 tabs daily    . bisoprolol-hydrochlorothiazide (ZIAC) 10-6.25 MG per tablet TAKE 1 TABLET BY MOUTH EACH DAY FOR BLOOD PRESSURE 90 tablet 1  . Cholecalciferol (VITAMIN D3) 5000 UNITS CAPS Take 5,000 Units by mouth daily.    Marland Kitchen losartan (COZAAR) 100 MG tablet Take 1 tablet (100 mg total) by mouth daily. 90 tablet 1  . omeprazole (PRILOSEC) 40 MG capsule TAKE 1 CAPSULE BY MOUTH 2 TIMES A DAY. 60 capsule PRN  . vitamin B-12 (CYANOCOBALAMIN) 50 MCG tablet Take 50 mcg by mouth daily.     No current facility-administered medications on file prior to visit.   Past Medical History  Diagnosis Date  . HTN (hypertension)   . Hyperlipemia     pt denies 08/08/12  . GERD (gastroesophageal reflux disease)   . Obesity   . History of Helicobacter pylori infection   . Vitamin D deficiency   . Hemorrhoids   . Peripheral vascular disease 9/14    DVT  left lower leg   Review of Systems  Constitutional: Positive for fatigue. Negative for fever, chills, diaphoresis, activity change, appetite change and unexpected weight change.  HENT: Negative.   Respiratory: Negative.   Negative for shortness of breath.   Cardiovascular: Negative.  Negative for chest pain.  Gastrointestinal: Positive for nausea. Negative for vomiting, abdominal pain, diarrhea, constipation, blood in stool, abdominal distention, anal bleeding and rectal pain.  Genitourinary: Positive for flank pain. Negative for dysuria, urgency, frequency, hematuria, decreased urine volume, discharge, penile swelling, scrotal swelling, enuresis, difficulty urinating, genital sores, penile pain and testicular pain.  Musculoskeletal: Negative.        Objective:   Physical Exam  Constitutional: He is oriented to person, place, and time. He appears well-developed and well-nourished.  Neck: Normal range of motion. Neck supple.  Cardiovascular: Normal rate, regular rhythm, normal heart sounds and intact distal pulses.   Pulmonary/Chest: Effort normal and breath sounds normal. He has no wheezes.  Abdominal: Soft. Bowel sounds are normal. He exhibits no distension and no mass. There is no tenderness. There is no rebound and no guarding.  Musculoskeletal: Normal range of motion.  Neurological: He is alert and oriented to person, place, and time.  Skin: Skin is warm and dry.      Assessment & Plan:  Nausea without vomiting, normal vitals, benign AB DDX kidney stone, infection, GERD, ulcer Has had some left flank pain fleeting and nausea, no urinary symptoms, no fever, chills Check labs including urine, will give zofran and add zantac with samples of nexium.

## 2014-10-01 LAB — CBC WITH DIFFERENTIAL/PLATELET
BASOS ABS: 0 10*3/uL (ref 0.0–0.1)
BASOS PCT: 0 % (ref 0–1)
EOS PCT: 0 % (ref 0–5)
Eosinophils Absolute: 0 10*3/uL (ref 0.0–0.7)
HEMATOCRIT: 46.4 % (ref 39.0–52.0)
HEMOGLOBIN: 16 g/dL (ref 13.0–17.0)
LYMPHS PCT: 9 % — AB (ref 12–46)
Lymphs Abs: 1.5 10*3/uL (ref 0.7–4.0)
MCH: 30.4 pg (ref 26.0–34.0)
MCHC: 34.5 g/dL (ref 30.0–36.0)
MCV: 88.2 fL (ref 78.0–100.0)
MPV: 9.8 fL (ref 8.6–12.4)
Monocytes Absolute: 2 10*3/uL — ABNORMAL HIGH (ref 0.1–1.0)
Monocytes Relative: 12 % (ref 3–12)
NEUTROS PCT: 79 % — AB (ref 43–77)
Neutro Abs: 13.2 10*3/uL — ABNORMAL HIGH (ref 1.7–7.7)
PLATELETS: 232 10*3/uL (ref 150–400)
RBC: 5.26 MIL/uL (ref 4.22–5.81)
RDW: 14 % (ref 11.5–15.5)
WBC: 16.7 10*3/uL — ABNORMAL HIGH (ref 4.0–10.5)

## 2014-10-01 LAB — URINALYSIS, MICROSCOPIC ONLY
BACTERIA UA: NONE SEEN [HPF]
CRYSTALS: NONE SEEN [HPF]
Casts: NONE SEEN [LPF]
YEAST: NONE SEEN [HPF]

## 2014-10-01 LAB — URINALYSIS, ROUTINE W REFLEX MICROSCOPIC
BILIRUBIN URINE: NEGATIVE
Glucose, UA: NEGATIVE
KETONES UR: NEGATIVE
Leukocytes, UA: NEGATIVE
Nitrite: NEGATIVE
PROTEIN: NEGATIVE
SPECIFIC GRAVITY, URINE: 1.022 (ref 1.001–1.035)
pH: 6 (ref 5.0–8.0)

## 2014-10-01 LAB — BASIC METABOLIC PANEL WITH GFR
BUN: 20 mg/dL (ref 7–25)
CO2: 28 mEq/L (ref 20–31)
Calcium: 9.4 mg/dL (ref 8.6–10.3)
Chloride: 96 mEq/L — ABNORMAL LOW (ref 98–110)
Creat: 1.79 mg/dL — ABNORMAL HIGH (ref 0.70–1.25)
GFR, EST AFRICAN AMERICAN: 45 mL/min — AB (ref >=60)
GFR, EST NON AFRICAN AMERICAN: 39 mL/min — AB (ref >=60)
Glucose, Bld: 108 mg/dL — ABNORMAL HIGH (ref 65–99)
POTASSIUM: 3.9 meq/L (ref 3.5–5.3)
SODIUM: 137 meq/L (ref 135–146)

## 2014-10-01 LAB — HEPATIC FUNCTION PANEL
ALBUMIN: 4 g/dL (ref 3.6–5.1)
ALK PHOS: 79 U/L (ref 40–115)
ALT: 26 U/L (ref 9–46)
AST: 28 U/L (ref 10–35)
BILIRUBIN DIRECT: 0.3 mg/dL — AB (ref <=0.2)
BILIRUBIN INDIRECT: 0.9 mg/dL (ref 0.2–1.2)
TOTAL PROTEIN: 6.8 g/dL (ref 6.1–8.1)
Total Bilirubin: 1.2 mg/dL (ref 0.2–1.2)

## 2014-10-01 MED ORDER — METRONIDAZOLE 500 MG PO TABS: 500.0000 mg | ORAL_TABLET | Freq: Three times a day (TID) | ORAL | Status: AC

## 2014-10-01 MED ORDER — CIPROFLOXACIN HCL 500 MG PO TABS: 500.0000 mg | ORAL_TABLET | Freq: Two times a day (BID) | ORAL | Status: DC

## 2014-10-01 NOTE — Addendum Note (Signed)
Addended by: Vicie Mutters R on: 10/01/2014 07:45 AM   Modules accepted: Orders

## 2014-10-02 LAB — URINE CULTURE
Colony Count: NO GROWTH
Organism ID, Bacteria: NO GROWTH

## 2014-10-04 ENCOUNTER — Encounter: Payer: Self-pay | Admitting: Physician Assistant

## 2014-10-04 ENCOUNTER — Ambulatory Visit (INDEPENDENT_AMBULATORY_CARE_PROVIDER_SITE_OTHER): Payer: Managed Care, Other (non HMO) | Admitting: Physician Assistant

## 2014-10-04 VITALS — BP 130/90 | HR 76 | Temp 98.3°F | Resp 16 | Ht 71.0 in | Wt 268.0 lb

## 2014-10-04 DIAGNOSIS — N289 Disorder of kidney and ureter, unspecified: Secondary | ICD-10-CM

## 2014-10-04 DIAGNOSIS — R109 Unspecified abdominal pain: Secondary | ICD-10-CM

## 2014-10-04 DIAGNOSIS — R110 Nausea: Secondary | ICD-10-CM

## 2014-10-04 LAB — CBC WITH DIFFERENTIAL/PLATELET
Basophils Absolute: 0 10*3/uL (ref 0.0–0.1)
Basophils Relative: 0 % (ref 0–1)
Eosinophils Absolute: 0.3 10*3/uL (ref 0.0–0.7)
Eosinophils Relative: 2 % (ref 0–5)
HEMATOCRIT: 46.1 % (ref 39.0–52.0)
HEMOGLOBIN: 16 g/dL (ref 13.0–17.0)
LYMPHS ABS: 1.7 10*3/uL (ref 0.7–4.0)
Lymphocytes Relative: 12 % (ref 12–46)
MCH: 30.5 pg (ref 26.0–34.0)
MCHC: 34.7 g/dL (ref 30.0–36.0)
MCV: 87.8 fL (ref 78.0–100.0)
MPV: 9.7 fL (ref 8.6–12.4)
Monocytes Absolute: 1.8 10*3/uL — ABNORMAL HIGH (ref 0.1–1.0)
Monocytes Relative: 13 % — ABNORMAL HIGH (ref 3–12)
NEUTROS ABS: 10.2 10*3/uL — AB (ref 1.7–7.7)
Neutrophils Relative %: 73 % (ref 43–77)
Platelets: 281 10*3/uL (ref 150–400)
RBC: 5.25 MIL/uL (ref 4.22–5.81)
RDW: 13.3 % (ref 11.5–15.5)
WBC: 14 10*3/uL — AB (ref 4.0–10.5)

## 2014-10-04 LAB — BASIC METABOLIC PANEL WITH GFR
BUN: 26 mg/dL — AB (ref 7–25)
CALCIUM: 9 mg/dL (ref 8.6–10.3)
CHLORIDE: 94 mmol/L — AB (ref 98–110)
CO2: 27 mmol/L (ref 20–31)
CREATININE: 1.98 mg/dL — AB (ref 0.70–1.25)
GFR, Est African American: 40 mL/min — ABNORMAL LOW (ref >=60)
GFR, Est Non African American: 34 mL/min — ABNORMAL LOW (ref >=60)
Glucose, Bld: 107 mg/dL — ABNORMAL HIGH (ref 65–99)
POTASSIUM: 3.6 mmol/L (ref 3.5–5.3)
SODIUM: 133 mmol/L — AB (ref 135–146)

## 2014-10-04 LAB — HEPATIC FUNCTION PANEL
ALBUMIN: 3.6 g/dL (ref 3.6–5.1)
ALK PHOS: 74 U/L (ref 40–115)
ALT: 21 U/L (ref 9–46)
AST: 16 U/L (ref 10–35)
BILIRUBIN DIRECT: 0.2 mg/dL (ref <=0.2)
Indirect Bilirubin: 0.6 mg/dL (ref 0.2–1.2)
Total Bilirubin: 0.8 mg/dL (ref 0.2–1.2)
Total Protein: 6.4 g/dL (ref 6.1–8.1)

## 2014-10-04 NOTE — Progress Notes (Signed)
Assessment and Plan: Left flank/AB pain- will repeat CBC/CMET, finish ABX, since still having the pain will get CT AB to rule out diverticulitis and kidney stone with complications, (has known 89mm stone left side that is being monitored).    HPI 64 y.o.male with history of diverticulitis, kidney stone on the left side (72mm), HTN, preDM, CKD stage II, chol presents for follow up from visit on 09/30/2014. He was seen for left flank pain, nausea, GERD and found to have abnormal labs. His CBC showed a left shift so cipro and flagyl was sent in for him which he is still on, and his kidney function was abnormal with GFR from 65-->39.    Was feeling better this morning, nausea has improved. But continues to have mild left flank pain, intermittent, nothing worse or better, has had decreased appetite, no fever/chills.   Lab Results  Component Value Date   WBC 16.7* 09/30/2014   HGB 16.0 09/30/2014   HCT 46.4 09/30/2014   MCV 88.2 09/30/2014   PLT 232 09/30/2014   Lab Results  Component Value Date   CREATININE 1.79* 09/30/2014   BUN 20 09/30/2014   NA 137 09/30/2014   K 3.9 09/30/2014   CL 96* 09/30/2014   CO2 28 09/30/2014    Past Medical History  Diagnosis Date  . HTN (hypertension)   . Hyperlipemia     pt denies 08/08/12  . GERD (gastroesophageal reflux disease)   . Obesity   . History of Helicobacter pylori infection   . Vitamin D deficiency   . Hemorrhoids   . Peripheral vascular disease 9/14    DVT  left lower leg     Allergies  Allergen Reactions  . Ace Inhibitors   . Citalopram       Current Outpatient Prescriptions on File Prior to Visit  Medication Sig Dispense Refill  . aspirin 81 MG tablet Take 81 mg by mouth daily. Takes 2 tabs daily    . bisoprolol-hydrochlorothiazide (ZIAC) 10-6.25 MG per tablet TAKE 1 TABLET BY MOUTH EACH DAY FOR BLOOD PRESSURE 90 tablet 1  . Cholecalciferol (VITAMIN D3) 5000 UNITS CAPS Take 5,000 Units by mouth daily.    . ciprofloxacin  (CIPRO) 500 MG tablet Take 1 tablet (500 mg total) by mouth 2 (two) times daily. 7 days 14 tablet 0  . losartan (COZAAR) 100 MG tablet Take 1 tablet (100 mg total) by mouth daily. 90 tablet 1  . metroNIDAZOLE (FLAGYL) 500 MG tablet Take 1 tablet (500 mg total) by mouth 3 (three) times daily. Can NOT drink alcohol with it 21 tablet 0  . omeprazole (PRILOSEC) 40 MG capsule TAKE 1 CAPSULE BY MOUTH 2 TIMES A DAY. 60 capsule PRN  . ondansetron (ZOFRAN) 4 MG tablet Take 1 tablet (4 mg total) by mouth daily as needed for nausea or vomiting. 30 tablet 1  . vitamin B-12 (CYANOCOBALAMIN) 50 MCG tablet Take 50 mcg by mouth daily.     No current facility-administered medications on file prior to visit.    ROS: all negative except above.   Physical Exam: Filed Weights   10/04/14 1054  Weight: 268 lb (121.564 kg)   BP 130/90 mmHg  Pulse 76  Temp(Src) 98.3 F (36.8 C)  Resp 16  Ht 5\' 11"  (1.803 m)  Wt 268 lb (121.564 kg)  BMI 37.39 kg/m2 General Appearance: Well nourished, in no apparent distress. Eyes: PERRLA, EOMs, conjunctiva no swelling or erythema Sinuses: No Frontal/maxillary tenderness ENT/Mouth: Ext aud canals clear, TMs without  erythema, bulging. No erythema, swelling, or exudate on post pharynx.  Tonsils not swollen or erythematous. Hearing normal.  Neck: Supple, thyroid normal.  Respiratory: Respiratory effort normal, BS equal bilaterally without rales, rhonchi, wheezing or stridor.  Cardio: RRR with no MRGs. Brisk peripheral pulses without edema.  Abdomen: Soft, + BS, mild left flank/AB tender, no guarding, no rebound, hernias, masses. Lymphatics: Non tender without lymphadenopathy.  Musculoskeletal: Full ROM, 5/5 strength, normal gait.  Skin: Warm, dry without rashes, lesions, ecchymosis.  Neuro: Cranial nerves intact. Normal muscle tone, no cerebellar symptoms. Sensation intact.  Psych: Awake and oriented X 3, normal affect, Insight and Judgment appropriate.     Vicie Mutters,  PA-C 10:59 AM [facility]

## 2014-10-04 NOTE — Patient Instructions (Signed)
Kidney Stones Kidney stones (urolithiasis) are solid masses that form inside your kidneys. The intense pain is caused by the stone moving through the kidney, ureter, bladder, and urethra (urinary tract). When the stone moves, the ureter starts to spasm around the stone. The stone is usually passed in your pee (urine).  HOME CARE  Drink enough fluids to keep your pee clear or pale yellow. This helps to get the stone out.  Strain all pee through the provided strainer. Do not pee without peeing through the strainer, not even once. If you pee the stone out, catch it in the strainer. The stone may be as small as a grain of salt. Take this to your doctor. This will help your doctor figure out what you can do to try to prevent more kidney stones.  Only take medicine as told by your doctor.  Follow up with your doctor as told.  Get follow-up X-rays as told by your doctor. GET HELP IF: You have pain that gets worse even if you have been taking pain medicine. GET HELP RIGHT AWAY IF:   Your pain does not get better with medicine.  You have a fever or shaking chills.  Your pain increases and gets worse over 18 hours.  You have new belly (abdominal) pain.  You feel faint or pass out.  You are unable to pee. MAKE SURE YOU:   Understand these instructions.  Will watch your condition.  Will get help right away if you are not doing well or get worse. Document Released: 08/08/2007 Document Revised: 10/22/2012 Document Reviewed: 07/23/2012 [facility] Patient Information 2015 [place], [place]. This information is not intended to replace advice given to you by your health care provider. Make sure you discuss any questions you have with your health care provider.   Flank Pain Flank pain is pain in your side. The flank is the area of your side between your upper belly (abdomen) and your back. Pain in this area can be caused by many different things. [place] care and treatment will depend on  the cause of your pain.  Rest as told by your doctor.  Drink enough fluids to keep your pee (urine) clear or pale yellow.  Only take medicine as told by your doctor.  Tell your doctor about any changes in your pain.  Follow up with your doctor. GET HELP RIGHT AWAY IF:   Your pain does not get better with medicine.   You have new symptoms or your symptoms get worse.  Your pain gets worse.   You have belly (abdominal) pain.   You are short of breath.   You always feel sick to your stomach (nauseous).   You keep throwing up (vomiting).   You have puffiness (swelling) in your belly.   You feel light-headed or you pass out (faint).   You have blood in your pee.  You have a fever or lasting symptoms for more than 2-3 days.  You have a fever and your symptoms suddenly get worse. MAKE SURE YOU:   Understand these instructions.  Will watch your condition.  Will get help right away if you are not doing well or get worse. Document Released: 11/29/2007 Document Revised: 07/06/2013 Document Reviewed: 10/04/2011 [facility] Patient Information 2015 [place], [place]. This information is not intended to replace advice given to you by your health care provider. Make sure you discuss any questions you have with your health care provider.

## 2014-10-05 ENCOUNTER — Emergency Department (HOSPITAL_COMMUNITY): Payer: Managed Care, Other (non HMO)

## 2014-10-05 ENCOUNTER — Telehealth: Payer: Self-pay | Admitting: Physician Assistant

## 2014-10-05 ENCOUNTER — Emergency Department (HOSPITAL_COMMUNITY)
Admission: EM | Admit: 2014-10-05 | Discharge: 2014-10-05 | Disposition: A | Discharge status: Home / Self Care | Payer: Managed Care, Other (non HMO) | Attending: Emergency Medicine | Admitting: Emergency Medicine

## 2014-10-05 ENCOUNTER — Encounter (HOSPITAL_COMMUNITY): Payer: Self-pay

## 2014-10-05 ENCOUNTER — Telehealth: Payer: Self-pay | Admitting: *Deleted

## 2014-10-05 DIAGNOSIS — N1330 Unspecified hydronephrosis: Secondary | ICD-10-CM | POA: Insufficient documentation

## 2014-10-05 DIAGNOSIS — K219 Gastro-esophageal reflux disease without esophagitis: Secondary | ICD-10-CM | POA: Diagnosis not present

## 2014-10-05 DIAGNOSIS — N200 Calculus of kidney: Secondary | ICD-10-CM

## 2014-10-05 DIAGNOSIS — R109 Unspecified abdominal pain: Secondary | ICD-10-CM

## 2014-10-05 DIAGNOSIS — Z79899 Other long term (current) drug therapy: Secondary | ICD-10-CM | POA: Diagnosis not present

## 2014-10-05 DIAGNOSIS — Z7982 Long term (current) use of aspirin: Secondary | ICD-10-CM | POA: Insufficient documentation

## 2014-10-05 DIAGNOSIS — I10 Essential (primary) hypertension: Secondary | ICD-10-CM | POA: Insufficient documentation

## 2014-10-05 DIAGNOSIS — E559 Vitamin D deficiency, unspecified: Secondary | ICD-10-CM | POA: Diagnosis not present

## 2014-10-05 DIAGNOSIS — E669 Obesity, unspecified: Secondary | ICD-10-CM | POA: Insufficient documentation

## 2014-10-05 DIAGNOSIS — Z86718 Personal history of other venous thrombosis and embolism: Secondary | ICD-10-CM | POA: Diagnosis not present

## 2014-10-05 LAB — CBC WITH DIFFERENTIAL/PLATELET
Basophils Absolute: 0 10*3/uL (ref 0.0–0.1)
Basophils Relative: 0 % (ref 0–1)
EOS PCT: 2 % (ref 0–5)
Eosinophils Absolute: 0.3 10*3/uL (ref 0.0–0.7)
HCT: 47.3 % (ref 39.0–52.0)
HEMOGLOBIN: 16.3 g/dL (ref 13.0–17.0)
Lymphocytes Relative: 11 % — ABNORMAL LOW (ref 12–46)
Lymphs Abs: 1.6 10*3/uL (ref 0.7–4.0)
MCH: 31.4 pg (ref 26.0–34.0)
MCHC: 34.5 g/dL (ref 30.0–36.0)
MCV: 91.1 fL (ref 78.0–100.0)
MONOS PCT: 18 % — AB (ref 3–12)
Monocytes Absolute: 2.7 10*3/uL — ABNORMAL HIGH (ref 0.1–1.0)
Neutro Abs: 10.3 10*3/uL — ABNORMAL HIGH (ref 1.7–7.7)
Neutrophils Relative %: 69 % (ref 43–77)
Platelets: 265 10*3/uL (ref 150–400)
RBC: 5.19 MIL/uL (ref 4.22–5.81)
RDW: 13.2 % (ref 11.5–15.5)
WBC: 15 10*3/uL — AB (ref 4.0–10.5)

## 2014-10-05 LAB — BASIC METABOLIC PANEL
ANION GAP: 10 (ref 5–15)
BUN: 23 mg/dL — ABNORMAL HIGH (ref 6–20)
CALCIUM: 9.3 mg/dL (ref 8.9–10.3)
CO2: 30 mmol/L (ref 22–32)
Chloride: 92 mmol/L — ABNORMAL LOW (ref 101–111)
Creatinine, Ser: 2.09 mg/dL — ABNORMAL HIGH (ref 0.61–1.24)
GFR calc non Af Amer: 32 mL/min — ABNORMAL LOW (ref >60)
GFR, EST AFRICAN AMERICAN: 37 mL/min — AB (ref >60)
Glucose, Bld: 96 mg/dL (ref 65–99)
POTASSIUM: 3.5 mmol/L (ref 3.5–5.1)
Sodium: 132 mmol/L — ABNORMAL LOW (ref 135–145)

## 2014-10-05 LAB — URINALYSIS, ROUTINE W REFLEX MICROSCOPIC
Glucose, UA: NEGATIVE mg/dL
Hgb urine dipstick: NEGATIVE
KETONES UR: NEGATIVE mg/dL
Nitrite: NEGATIVE
PROTEIN: 30 mg/dL — AB
SPECIFIC GRAVITY, URINE: 1.023 (ref 1.005–1.030)
UROBILINOGEN UA: 0.2 mg/dL (ref 0.0–1.0)
pH: 6 (ref 5.0–8.0)

## 2014-10-05 LAB — I-STAT CHEM 8, ED
BUN: 27 mg/dL — ABNORMAL HIGH (ref 6–20)
Calcium, Ion: 1.15 mmol/L (ref 1.13–1.30)
Chloride: 92 mmol/L — ABNORMAL LOW (ref 101–111)
Creatinine, Ser: 2 mg/dL — ABNORMAL HIGH (ref 0.61–1.24)
Glucose, Bld: 100 mg/dL — ABNORMAL HIGH (ref 65–99)
HCT: 47 % (ref 39.0–52.0)
Hemoglobin: 16 g/dL (ref 13.0–17.0)
POTASSIUM: 3.4 mmol/L — AB (ref 3.5–5.1)
Sodium: 134 mmol/L — ABNORMAL LOW (ref 135–145)
TCO2: 31 mmol/L (ref 0–100)

## 2014-10-05 LAB — URINE MICROSCOPIC-ADD ON

## 2014-10-05 MED ORDER — HYDROCODONE-ACETAMINOPHEN 5-325 MG PO TABS
1.0000 | ORAL_TABLET | ORAL | Status: DC | PRN
Start: 2014-10-05 — End: 2014-11-25

## 2014-10-05 MED ORDER — TAMSULOSIN HCL 0.4 MG PO CAPS: 0.4000 mg | ORAL_CAPSULE | Freq: Every day | ORAL | Status: DC

## 2014-10-05 MED ORDER — ONDANSETRON HCL 4 MG/2ML IJ SOLN
4.0000 mg | Freq: Once | INTRAMUSCULAR | Status: AC
  Administered 2014-10-05: 4 mg via INTRAVENOUS
  Filled 2014-10-05: qty 2

## 2014-10-05 MED ORDER — SODIUM CHLORIDE 0.9 % IV BOLUS (SEPSIS)
1000.0000 mL | Freq: Once | INTRAVENOUS | Status: AC
  Administered 2014-10-05: 1000 mL via INTRAVENOUS

## 2014-10-05 MED ORDER — ONDANSETRON 4 MG PO TBDP: 4.0000 mg | ORAL_TABLET | Freq: Three times a day (TID) | ORAL | Status: DC | PRN

## 2014-10-05 NOTE — Telephone Encounter (Signed)
Pt wants to know if you would call him to discuss visit to the hospital & also discuss the meds? & what you want him to take at this point?

## 2014-10-05 NOTE — ED Notes (Signed)
Per pt, went to MD last Thursday for nausea.  No vomiting.  Slight abdominal pain on left side.  Pt has hx of diverticulosis.  Pt states he felt same as his hx of diverticulitis.  MD told him it "might be the same".  2 antibiotics given.  Blood work done and told to see her on Monday.  MD repeated blood work yesterday.  Told to come to ER b/c pt is dehydrated and WBC were elevated. Abdominal pain has resolved.  No nausea today

## 2014-10-05 NOTE — Discharge Instructions (Signed)
Hydronephrosis Hydronephrosis is an abnormal enlargement of your kidney. It can affect one or both the kidneys. It results from the backward pressure of urine on the kidneys, when the flow of urine is blocked. Normally, the urine drains from the kidney through the urine tube (ureter), into a sac which holds the urine until urination (bladder). When the urinary flow is blocked, the urine collects above the block. This causes an increase in the pressure inside the kidney, which in turn leads to its enlargement. The block can occur at the point where the kidney joins the ureter. Treatment depends on the cause and location of the block.  CAUSES  The causes of this condition include:  Birth defect of the kidney or ureter.  Kink at the point where the kidney joins the ureter.  Stones and blood clots in the kidney or ureter.  Cancer, injury, or infection of the ureter.  Scar tissue formation.  Backflow of urine (reflux).  Cancer of bladder or prostate gland.  Abnormality of the nerves or muscles of the kidney or ureter.  Lower part of the ureter protruding into the bladder (ureterocele).  Abnormal contractions of the bladder.  Both the kidneys can be affected during pregnancy. This is because the enlarging uterus presses on the ureters and blocks the flow of urine. SYMPTOMS  The symptoms depend on the location of the block. They also depend on how long the block has been present. You may feel pain on the affected side. Sometimes, you may not have any symptoms. There may be a dull ache or discomfort in the flank. The common symptoms are:  Flank pain.  Swelling of the abdomen.  Pain in the abdomen.  Nausea and vomiting.  Fever.  Pain while passing urine.  Urgency for urination.  Frequent or urgent urination.  Infection of the urinary tract. DIAGNOSIS  Your caregiver will examine you after asking about your symptoms. You may be asked to do blood and urine tests. Your caregiver  may order a special X-ray, ultrasound, or CT scan. Sometimes a rigid or flexible telescope (cystoscope) is used to view the site of the blockage.  TREATMENT  Treatment depends on the site, cause, and duration of the block. The goal of treatment is to remove the blockage. Your caregiver will plan the treatment based on your condition. The different types of treatment are:   Putting in a soft plastic tube (ureteral stent) to connect the bladder with the kidney. This will help in draining the urine.  Putting in a soft tube (nephrostomy tube). This is placed through skin into the kidney. The trapped urine is drained out through the back. A plastic bag is attached to your skin to hold the urine that has drained out.  Antibiotics to treat or prevent infection.  Breaking down of the stone (lithotripsy). HOME CARE INSTRUCTIONS   It may take some time for the hydronephrosis to go away (resolve). Drink fluids as directed by your caregiver , and get a lot of rest.  If you have a drain in, your caregiver will give you directions about how to care for it. Be sure you understand these directions completely before you go home.  Take any antibiotics, pain medications, or other prescriptions exactly as prescribed.  Follow-up with your caregivers as directed. SEEK MEDICAL CARE IF:   You continue to have flank pain, nausea, or difficulty with urination.  You have any problem with any type of drainage device.  Your urine becomes cloudy or bloody. SEEK  IMMEDIATE MEDICAL CARE IF:   You have severe flank and/or abdominal pain.  You develop vomiting and are unable to hold down fluids.  You develop a fever above 100.5 F (38.1 C), or as per your caregiver. MAKE SURE YOU:   Understand these instructions.  Will watch your condition.  Will get help right away if you are not doing well or get worse. Document Released: 12/17/2006 Document Revised: 05/14/2011 Document Reviewed: 02/02/2010 [facility]  Patient Information 2015 [place], [place]. This information is not intended to replace advice given to you by your health care provider. Make sure you discuss any questions you have with your health care provider.  Kidney Stones Kidney stones (urolithiasis) are solid masses that form inside your kidneys. The intense pain is caused by the stone moving through the kidney, ureter, bladder, and urethra (urinary tract). When the stone moves, the ureter starts to spasm around the stone. The stone is usually passed in your pee (urine).  HOME CARE  Drink enough fluids to keep your pee clear or pale yellow. This helps to get the stone out.  Strain all pee through the provided strainer. Do not pee without peeing through the strainer, not even once. If you pee the stone out, catch it in the strainer. The stone may be as small as a grain of salt. Take this to your doctor. This will help your doctor figure out what you can do to try to prevent more kidney stones.  Only take medicine as told by your doctor.  Follow up with your doctor as told.  Get follow-up X-rays as told by your doctor. GET HELP IF: You have pain that gets worse even if you have been taking pain medicine. GET HELP RIGHT AWAY IF:   Your pain does not get better with medicine.  You have a fever or shaking chills.  Your pain increases and gets worse over 18 hours.  You have new belly (abdominal) pain.  You feel faint or pass out.  You are unable to pee. MAKE SURE YOU:   Understand these instructions.  Will watch your condition.  Will get help right away if you are not doing well or get worse. Document Released: 08/08/2007 Document Revised: 10/22/2012 Document Reviewed: 07/23/2012 [facility] Patient Information 2015 [place], [place]. This information is not intended to replace advice given to you by your health care provider. Make sure you discuss any questions you have with your health care provider.

## 2014-10-05 NOTE — ED Provider Notes (Signed)
CSN: [id number]     Arrival date & time 10/05/14  9417 History   First MD Initiated Contact with Patient 10/05/14 1046     Chief Complaint  Patient presents with  . Abnormal Lab     (Consider location/radiation/quality/duration/timing/severity/associated sxs/prior Treatment) HPI   Patient is a 64 year old male with history of hypertension, hyperlipidemia, kidney stone, GERD and obesity, who presents to the ER following the instructions of his PCP. A week ago he had left flank pain with radiation to his abdomen with nausea and decreased appetite. He was seen and evaluated by his physician who prescribed him Cipro Flagyl for suspected diverticulitis. His abdominal pain has improved however he has continued to have some generalized nausea and decreased appetite, and has had very little fluid intake. His physician had repeated labs and instructed him to come to the emergency room for increased BUN/creatinine.   Pt is also an established pt with urology, with a known large left stone.  He denies any flank pain, chest pain, SOB, fever, V, D.    Past Medical History  Diagnosis Date  . HTN (hypertension)   . Hyperlipemia     pt denies 08/08/12  . GERD (gastroesophageal reflux disease)   . Obesity   . History of Helicobacter pylori infection   . Vitamin D deficiency   . Hemorrhoids   . Peripheral vascular disease 9/14    DVT  left lower leg   Past Surgical History  Procedure Laterality Date  . Hemorrhoid surgery      age 37  . Tonsillectomy    . Cystoscopy/retrograde/ureteroscopy Left 01/02/2013    Procedure: CYSTOSCOPY/LEFT URETERAL WASHINGS/LEFT RETROGRADE PYELOGRAM/ LEFT URETEROSCOPY/URETERAL BIOPSY. BLADDER BIOPSY;  Surgeon: Ardis Hughs, MD;  Location: [facility];  Service: Urology;  Laterality: Left;  With STENT   Family History  Problem Relation Age of Onset  . Diabetes Father   . Heart disease Father   . Cancer Mother     Bladder   History  Substance Use Topics  . Smoking  status: Never Smoker   . Smokeless tobacco: Never Used  . Alcohol Use: No    Review of Systems 10 Systems reviewed and are negative for acute change except as noted in the HPI.   Allergies  Ace inhibitors and Citalopram  Home Medications   Prior to Admission medications   Medication Sig Start Date End Date Taking? Authorizing Provider  aspirin 81 MG tablet Take 162 mg by mouth daily. Takes 2 tabs daily   Yes Historical Provider, MD  bisoprolol-hydrochlorothiazide [facility]) 10-6.25 MG per tablet TAKE 1 TABLET BY MOUTH EACH DAY FOR BLOOD PRESSURE 08/16/14  Yes Unk Pinto, MD  Cholecalciferol (VITAMIN D3) 5000 UNITS CAPS Take 5,000 Units by mouth daily.   Yes Historical Provider, MD  ciprofloxacin (CIPRO) 500 MG tablet Take 1 tablet (500 mg total) by mouth 2 (two) times daily. 7 days 10/01/14  Yes Vicie Mutters, PA-C  esomeprazole (NEXIUM) 40 MG capsule Take 40 mg by mouth daily at 12 noon.   Yes Historical Provider, MD  losartan (COZAAR) 100 MG tablet Take 1 tablet (100 mg total) by mouth daily. 02/04/14  Yes Unk Pinto, MD  metroNIDAZOLE (FLAGYL) 500 MG tablet Take 1 tablet (500 mg total) by mouth 3 (three) times daily. Can NOT drink alcohol with it 10/01/14 10/08/14 Yes Vicie Mutters, PA-C  ondansetron (ZOFRAN) 4 MG tablet Take 1 tablet (4 mg total) by mouth daily as needed for nausea or vomiting. 09/30/14 09/30/15 Yes Vicie Mutters,  PA-C  vitamin B-12 (CYANOCOBALAMIN) 50 MCG tablet Take 50 mcg by mouth daily.   Yes Historical Provider, MD  omeprazole (PRILOSEC) 40 MG capsule TAKE 1 CAPSULE BY MOUTH 2 TIMES A DAY. Patient not taking: Reported on 10/05/2014    Vicie Mutters, PA-C   BP 139/86 mmHg  Pulse 82  Temp(Src) 98.4 F (36.9 C) (Oral)  Resp 18  Ht 6' (1.829 m)  Wt 260 lb (117.935 kg)  BMI 35.25 kg/m2  SpO2 99% Physical Exam  Constitutional: He is oriented to person, place, and time. He appears well-developed and well-nourished. No distress.  HENT:  Head: Normocephalic and  atraumatic.  Nose: Nose normal.  Mouth/Throat: No oropharyngeal exudate.  Oral mucosa dry  Eyes: Conjunctivae and EOM are normal. Pupils are equal, round, and reactive to light. Right eye exhibits no discharge. Left eye exhibits no discharge. No scleral icterus.  Neck: Normal range of motion. No JVD present. No tracheal deviation present. No thyromegaly present.  Cardiovascular: Normal rate, regular rhythm, normal heart sounds and intact distal pulses.  Exam reveals no gallop and no friction rub.   No murmur heard. No LE pitting edema  Pulmonary/Chest: Effort normal and breath sounds normal. No respiratory distress. He has no wheezes. He has no rales. He exhibits no tenderness.  Abdominal: Soft. Bowel sounds are normal. He exhibits no distension and no mass. There is no tenderness. There is no rebound and no guarding.  Abdomen obese, non-distended, normal BS x 4, not ttp, without rebound or guarding  Musculoskeletal: Normal range of motion. He exhibits no edema or tenderness.  Lymphadenopathy:    He has no cervical adenopathy.  Neurological: He is alert and oriented to person, place, and time. He has normal reflexes. No cranial nerve deficit. He exhibits normal muscle tone. Coordination normal.  Skin: Skin is warm and dry. No rash noted. He is not diaphoretic. No erythema. No pallor.  Psychiatric: He has a normal mood and affect. His behavior is normal. Judgment and thought content normal.  Nursing note and vitals reviewed.   ED Course  Procedures (including critical care time) Labs Review Labs Reviewed  CBC WITH DIFFERENTIAL/PLATELET - Abnormal; Notable for the following:    WBC 15.0 (*)    Neutro Abs 10.3 (*)    Lymphocytes Relative 11 (*)    Monocytes Relative 18 (*)    Monocytes Absolute 2.7 (*)    All other components within normal limits  BASIC METABOLIC PANEL - Abnormal; Notable for the following:    Sodium 132 (*)    Chloride 92 (*)    BUN 23 (*)    Creatinine, Ser 2.09  (*)    GFR calc non Af Amer 32 (*)    GFR calc Af Amer 37 (*)    All other components within normal limits  URINALYSIS, ROUTINE W REFLEX MICROSCOPIC (NOT AT [facility]) - Abnormal; Notable for the following:    Color, Urine AMBER (*)    Bilirubin Urine SMALL (*)    Protein, ur 30 (*)    Leukocytes, UA SMALL (*)    All other components within normal limits  I-STAT CHEM 8, ED - Abnormal; Notable for the following:    Sodium 134 (*)    Potassium 3.4 (*)    Chloride 92 (*)    BUN 27 (*)    Creatinine, Ser 2.00 (*)    Glucose, Bld 100 (*)    All other components within normal limits  URINE MICROSCOPIC-ADD ON    Imaging  Review No results found.   EKG Interpretation None      MDM   Final diagnoses:  Kidney stone  Abdominal pain   Pt with nausea, recent Abx tx for diverticulitis, with increased BUN/cr and leukocytosis, was sent to [facility] ED Medications  sodium chloride 0.9 % bolus 1,000 mL (0 mLs Intravenous Stopped 10/05/14 1202)  ondansetron (ZOFRAN) injection 4 mg (4 mg Intravenous Given 10/05/14 1111)  sodium chloride 0.9 % bolus 1,000 mL (0 mLs Intravenous Stopped 10/05/14 1514)  Pt was given fluids and zofran, did not complain of pain. CT abd/pelvis w/o contrast: 10 x 11 x 14 mm proximal left ureteral obstructing stone with moderate-to-marked left-sided hydronephrosis  The case was discussed with urology, who stated they would follow patient closely outpatient, instructed to give Toradol, Flomax, and they will schedule him for intervention.  Pt has been stable with normal vitals, no pain, and UA reassuring without pyuria. Pt was updated with plan from urology, with family at bedside, was in agreement with plan.  STrict return precautions given and verbally acknowledged.   Filed Vitals:   10/05/14 1506  BP: 150/72  Pulse: 69  Temp: 98 F (36.7 C)  Resp: [address], PA-C 10/22/14 [place], MD 10/22/14 1800

## 2014-10-05 NOTE — Telephone Encounter (Signed)
64 y.o.male with history of diverticulitis, kidney stone on the left side (28mm), HTN, preDM, CKD stage II, chol, called and discussed labs, he continues to have worsening acute on chronic renal failure with leukocytosis. Have instructed him that with his left flank pain and worsening labs to go to the ER for fluids and possible CT AB for kidney stone/evaluation. Patient understands and will go to [facility].   Lab Results  Component Value Date   WBC 14.0* 10/04/2014   WBC 16.7* 09/30/2014   WBC 10.2 09/02/2014   Lab Results  Component Value Date   GFRNONAA 34* 10/04/2014   GFRNONAA 39* 09/30/2014   GFRNONAA 65 09/02/2014

## 2014-10-06 NOTE — Telephone Encounter (Signed)
Spoke with patient about ER visit, had two bags of fluids and states he is feeling better today, has appointment with Dr. Louis Meckel Friday and states will increase fluids

## 2014-10-08 ENCOUNTER — Other Ambulatory Visit: Payer: Self-pay | Admitting: Urology

## 2014-10-11 ENCOUNTER — Ambulatory Visit (HOSPITAL_COMMUNITY)
Admission: RE | Admit: 2014-10-11 | Discharge: 2014-10-11 | Disposition: A | Discharge status: Home / Self Care | Payer: Managed Care, Other (non HMO) | Source: Ambulatory Visit | Attending: Urology | Admitting: Urology

## 2014-10-11 ENCOUNTER — Ambulatory Visit (HOSPITAL_COMMUNITY): Payer: Managed Care, Other (non HMO)

## 2014-10-11 ENCOUNTER — Encounter (HOSPITAL_COMMUNITY)
Admission: RE | Disposition: A | Discharge status: Home / Self Care | Payer: Self-pay | Source: Ambulatory Visit | Attending: Urology

## 2014-10-11 ENCOUNTER — Encounter (HOSPITAL_COMMUNITY): Payer: Self-pay | Admitting: *Deleted

## 2014-10-11 DIAGNOSIS — I10 Essential (primary) hypertension: Secondary | ICD-10-CM | POA: Insufficient documentation

## 2014-10-11 DIAGNOSIS — E669 Obesity, unspecified: Secondary | ICD-10-CM | POA: Insufficient documentation

## 2014-10-11 DIAGNOSIS — K219 Gastro-esophageal reflux disease without esophagitis: Secondary | ICD-10-CM | POA: Insufficient documentation

## 2014-10-11 DIAGNOSIS — N201 Calculus of ureter: Secondary | ICD-10-CM | POA: Diagnosis not present

## 2014-10-11 DIAGNOSIS — Z7982 Long term (current) use of aspirin: Secondary | ICD-10-CM | POA: Diagnosis not present

## 2014-10-11 DIAGNOSIS — I739 Peripheral vascular disease, unspecified: Secondary | ICD-10-CM | POA: Diagnosis not present

## 2014-10-11 DIAGNOSIS — R109 Unspecified abdominal pain: Secondary | ICD-10-CM | POA: Diagnosis present

## 2014-10-11 DIAGNOSIS — E785 Hyperlipidemia, unspecified: Secondary | ICD-10-CM | POA: Diagnosis not present

## 2014-10-11 DIAGNOSIS — Z86718 Personal history of other venous thrombosis and embolism: Secondary | ICD-10-CM | POA: Insufficient documentation

## 2014-10-11 DIAGNOSIS — Z6835 Body mass index (BMI) 35.0-35.9, adult: Secondary | ICD-10-CM | POA: Diagnosis not present

## 2014-10-11 DIAGNOSIS — Z79899 Other long term (current) drug therapy: Secondary | ICD-10-CM | POA: Diagnosis not present

## 2014-10-11 DIAGNOSIS — Z79891 Long term (current) use of opiate analgesic: Secondary | ICD-10-CM | POA: Insufficient documentation

## 2014-10-11 SURGERY — LITHOTRIPSY, ESWL
Anesthesia: LOCAL | Laterality: Left

## 2014-10-11 MED ORDER — CIPROFLOXACIN IN D5W 400 MG/200ML IV SOLN
400.0000 mg | INTRAVENOUS | Status: AC
  Administered 2014-10-11: 400 mg via INTRAVENOUS
  Filled 2014-10-11: qty 200

## 2014-10-11 MED ORDER — DIPHENHYDRAMINE HCL 25 MG PO CAPS
25.0000 mg | ORAL_CAPSULE | ORAL | Status: AC
  Administered 2014-10-11: 25 mg via ORAL
  Filled 2014-10-11: qty 1

## 2014-10-11 MED ORDER — OXYCODONE-ACETAMINOPHEN 5-325 MG PO TABS: 1.0000 | ORAL_TABLET | ORAL | Status: DC | PRN

## 2014-10-11 MED ORDER — SODIUM CHLORIDE 0.9 % IV SOLN
INTRAVENOUS | Status: DC
Start: 2014-10-11 — End: 2014-10-11
  Administered 2014-10-11: 08:00:00 via INTRAVENOUS

## 2014-10-11 MED ORDER — DIAZEPAM 5 MG PO TABS
10.0000 mg | ORAL_TABLET | ORAL | Status: AC
  Administered 2014-10-11: 10 mg via ORAL
  Filled 2014-10-11: qty 2

## 2014-10-11 NOTE — Discharge Instructions (Signed)
Lithotripsy for Kidney Stones °Lithotripsy is a treatment that can sometimes help eliminate kidney stones and pain that they cause. A form of lithotripsy, also known as extracorporeal shock wave lithotripsy, is a nonsurgical procedure that helps your body rid itself of the kidney stone when it is too big to pass on its own. Extracorporeal shock wave lithotripsy is a method of crushing a kidney stone with shock waves. These shock waves pass through your body and are focused on your stone. They cause the kidney stones to crumble while still in the urinary tract. It is then easier for the smaller pieces of stone to pass in the urine. °Lithotripsy usually takes about an hour. It is done in a hospital, a lithotripsy center, or a mobile unit. It usually does not require an overnight stay. Your health care provider will instruct you on preparation for the procedure. Your health care provider will tell you what to expect afterward. °LET YOUR HEALTH CARE PROVIDER KNOW ABOUT: °· Any allergies you have. °· All medicines you are taking, including vitamins, herbs, eye drops, creams, and over-the-counter medicines. °· Previous problems you or members of your family have had with the use of anesthetics. °· Any blood disorders you have. °· Previous surgeries you have had. °· Medical conditions you have. °RISKS AND COMPLICATIONS °Generally, lithotripsy for kidney stones is a safe procedure. However, as with any procedure, complications can occur. Possible complications include: °· Infection. °· Bleeding of the kidney. °· Bruising of the kidney or skin. °· Obstruction of the ureter. °· Failure of the stone to fragment. °BEFORE THE PROCEDURE °· Do not eat or drink for 6-8 hours prior to the procedure. You may, however, take the medications with a sip of water that your physician instructs you to take °· Do not take aspirin or aspirin-containing products for 7 days prior to your procedure °· Do not take nonsteroidal anti-inflammatory  products for 7 days prior to your procedure °PROCEDURE °A stent (flexible tube with holes) may be placed in your ureter. The ureter is the tube that transports the urine from the kidneys to the bladder. Your health care provider may place a stent before the procedure. This will help keep urine flowing from the kidney if the fragments of the stone block the ureter. You may have an IV tube placed in one of your veins to give you fluids and medicines. These medicines may help you relax or make you sleep. During the procedure, you will lie comfortably on a fluid-filled cushion or in a warm-water bath. After an X-ray or ultrasound exam to locate your stone, shock waves are aimed at the stone. If you are awake, you may feel a tapping sensation as the shock waves pass through your body. If large stone particles remain after treatment, a second procedure may be necessary at a later date. °For comfort during the test: °· Relax as much as possible. °· Try to remain still as much as possible. °· Try to follow instructions to speed up the test. °· Let your health care provider know if you are uncomfortable, anxious, or in pain. °AFTER THE PROCEDURE  °After surgery, you will be taken to the recovery area. A nurse will watch and check your progress. Once you're awake, stable, and taking fluids well, you will be allowed to go home as long as there are no problems. You will also be allowed to pass your urine before discharge. You may be given antibiotics to help prevent infection. You may also be prescribed   pain medicine if needed. In a week or two, your health care provider may remove your stent, if you have one. You may first have an X-ray exam to check on how successful the fragmentation of your stone has been and how much of the stone has passed. Your health care provider will check to see whether or not stone particles remain. °SEEK IMMEDIATE MEDICAL CARE IF: °· You develop a fever or shaking chills. °· Your pain is not  relieved by medicine. °· You feel sick to your stomach (nauseated) and you vomit. °· You develop heavy bleeding. °· You have difficulty urinating. °· You start to pass your stent from your penis. °Document Released: 02/17/2000 Document Revised: 12/10/2012 Document Reviewed: 09/04/2012 °[employer]® Patient Information ©2015 [employer]. This information is not intended to replace advice given to you by your health care provider. Make sure you discuss any questions you have with your health care provider. ° °

## 2014-10-11 NOTE — H&P (Signed)
Urology Admission H&P  Chief Complaint: left flank pain  History of Present Illness: Mr Jason Patton is a 64yo with a hx of nephrolithiasis who was found to have a Left 52mm UPJ stone with mild hydro. He has dull intermittent, mild, nonradaiting left flank pain. He denies fevers/chills/sweats  Past Medical History  Diagnosis Date  . HTN (hypertension)   . Hyperlipemia     pt denies 08/08/12  . GERD (gastroesophageal reflux disease)   . Obesity   . History of Helicobacter pylori infection   . Vitamin D deficiency   . Hemorrhoids   . Peripheral vascular disease 9/14    DVT  left lower leg   Past Surgical History  Procedure Laterality Date  . Hemorrhoid surgery      age 83  . Tonsillectomy    . Cystoscopy/retrograde/ureteroscopy Left 01/02/2013    Procedure: CYSTOSCOPY/LEFT URETERAL WASHINGS/LEFT RETROGRADE PYELOGRAM/ LEFT URETEROSCOPY/URETERAL BIOPSY. BLADDER BIOPSY;  Surgeon: Ardis Hughs, MD;  Location: [facility];  Service: Urology;  Laterality: Left;  With STENT    Home Medications:  Prescriptions prior to admission  Medication Sig Dispense Refill Last Dose  . aspirin 81 MG tablet Take 162 mg by mouth daily.    10/07/2014 at Unknown time  . bisoprolol-hydrochlorothiazide (ZIAC) 10-6.25 MG per tablet TAKE 1 TABLET BY MOUTH EACH DAY FOR BLOOD PRESSURE 90 tablet 1 10/10/2014 at am  . Cholecalciferol (VITAMIN D3) 5000 UNITS CAPS Take 5,000 Units by mouth daily.   10/10/2014 at Unknown time  . HYDROcodone-acetaminophen (NORCO/VICODIN) 5-325 MG per tablet Take 1-2 tablets by mouth every 4 (four) hours as needed for moderate pain. 6 tablet 0 unknown  . losartan (COZAAR) 100 MG tablet Take 1 tablet (100 mg total) by mouth daily. 90 tablet 1 10/10/2014 at am  . omeprazole (PRILOSEC) 40 MG capsule TAKE 1 CAPSULE BY MOUTH 2 TIMES A DAY. 60 capsule PRN 10/10/2014 at Unknown time  . vitamin B-12 (CYANOCOBALAMIN) 50 MCG tablet Take 50 mcg by mouth daily.   10/10/2014 at Unknown time  . ciprofloxacin (CIPRO)  500 MG tablet Take 1 tablet (500 mg total) by mouth 2 (two) times daily. 7 days (Patient not taking: Reported on 10/11/2014) 14 tablet 0 Not Taking at Unknown time  . ondansetron (ZOFRAN ODT) 4 MG disintegrating tablet Take 1 tablet (4 mg total) by mouth every 8 (eight) hours as needed for nausea. (Patient not taking: Reported on 10/11/2014) 10 tablet 0 Not Taking at Unknown time  . ondansetron (ZOFRAN) 4 MG tablet Take 1 tablet (4 mg total) by mouth daily as needed for nausea or vomiting. (Patient not taking: Reported on 10/11/2014) 30 tablet 1 Not Taking at Unknown time  . tamsulosin (FLOMAX) 0.4 MG CAPS capsule Take 1 capsule (0.4 mg total) by mouth daily after supper. (Patient not taking: Reported on 10/11/2014) 30 capsule 0 Not Taking at Unknown time   Allergies:  Allergies  Allergen Reactions  . Ace Inhibitors Other (See Comments)    Cough   . Citalopram Nausea And Vomiting    Family History  Problem Relation Age of Onset  . Diabetes Father   . Heart disease Father   . Cancer Mother     Bladder   Social History:  reports that he has never smoked. He has never used smokeless tobacco. He reports that he does not drink alcohol or use illicit drugs.  Review of Systems  Genitourinary: Positive for flank pain.  All other systems reviewed and are negative.   Physical Exam:  Vital signs in last 24 hours: Temp:  [98.5 F (36.9 C)] 98.5 F (36.9 C) (08/08 0645) Pulse Rate:  [84] 84 (08/08 0645) Resp:  [18] 18 (08/08 0645) BP: (152)/(94) 152/94 mmHg (08/08 0645) SpO2:  [97 %] 97 % (08/08 0645) Weight:  [119.296 kg (263 lb)] 119.296 kg (263 lb) (08/08 0645) Physical Exam  Constitutional: He is oriented to person, place, and time. He appears well-developed and well-nourished.  HENT:  Head: Normocephalic and atraumatic.  Eyes: EOM are normal. Pupils are equal, round, and reactive to light.  Neck: Normal range of motion. Neck supple. No thyromegaly present.  Cardiovascular: Normal rate and  regular rhythm.   Respiratory: Effort normal and breath sounds normal.  GI: Soft. He exhibits no distension.  Musculoskeletal: Normal range of motion.  Neurological: He is alert and oriented to person, place, and time.  Skin: Skin is warm and dry.  Psychiatric: He has a normal mood and affect. His behavior is normal. Judgment and thought content normal.    Laboratory Data:  No results found for this or any previous visit (from the past 24 hour(s)). No results found for this or any previous visit (from the past 240 hour(s)). Creatinine:  Recent Labs  10/04/14 1113 10/05/14 1044 10/05/14 1130  CREATININE 1.98* 2.09* 2.00*    Impression/Assessment:  Left renal stone  Plan:  Risks/benefits/alternatives to L ESWL was explained to the patient and he understands wand wishes to proceed with ESWL  Mekayla Soman L 10/11/2014, 8:20 AM

## 2014-10-20 ENCOUNTER — Other Ambulatory Visit: Payer: Self-pay | Admitting: Physician Assistant

## 2014-11-25 ENCOUNTER — Ambulatory Visit (INDEPENDENT_AMBULATORY_CARE_PROVIDER_SITE_OTHER): Payer: Managed Care, Other (non HMO) | Admitting: Internal Medicine

## 2014-11-25 ENCOUNTER — Encounter: Payer: Self-pay | Admitting: Internal Medicine

## 2014-11-25 VITALS — BP 142/84 | HR 76 | Temp 97.3°F | Resp 16 | Ht 71.0 in | Wt 269.6 lb

## 2014-11-25 DIAGNOSIS — Z125 Encounter for screening for malignant neoplasm of prostate: Secondary | ICD-10-CM

## 2014-11-25 DIAGNOSIS — R7309 Other abnormal glucose: Secondary | ICD-10-CM

## 2014-11-25 DIAGNOSIS — E559 Vitamin D deficiency, unspecified: Secondary | ICD-10-CM

## 2014-11-25 DIAGNOSIS — Z23 Encounter for immunization: Secondary | ICD-10-CM

## 2014-11-25 DIAGNOSIS — Z0001 Encounter for general adult medical examination with abnormal findings: Secondary | ICD-10-CM

## 2014-11-25 DIAGNOSIS — Z79899 Other long term (current) drug therapy: Secondary | ICD-10-CM | POA: Diagnosis not present

## 2014-11-25 DIAGNOSIS — I10 Essential (primary) hypertension: Secondary | ICD-10-CM | POA: Diagnosis not present

## 2014-11-25 DIAGNOSIS — N182 Chronic kidney disease, stage 2 (mild): Secondary | ICD-10-CM

## 2014-11-25 DIAGNOSIS — Z1212 Encounter for screening for malignant neoplasm of rectum: Secondary | ICD-10-CM

## 2014-11-25 DIAGNOSIS — Z0000 Encounter for general adult medical examination without abnormal findings: Secondary | ICD-10-CM

## 2014-11-25 DIAGNOSIS — Z6837 Body mass index (BMI) 37.0-37.9, adult: Secondary | ICD-10-CM

## 2014-11-25 DIAGNOSIS — E785 Hyperlipidemia, unspecified: Secondary | ICD-10-CM

## 2014-11-25 DIAGNOSIS — R7303 Prediabetes: Secondary | ICD-10-CM

## 2014-11-25 DIAGNOSIS — R5383 Other fatigue: Secondary | ICD-10-CM

## 2014-11-25 LAB — CBC WITH DIFFERENTIAL/PLATELET
BASOS PCT: 0 % (ref 0–1)
Basophils Absolute: 0 10*3/uL (ref 0.0–0.1)
EOS ABS: 0.5 10*3/uL (ref 0.0–0.7)
Eosinophils Relative: 5 % (ref 0–5)
HCT: 43.3 % (ref 39.0–52.0)
HEMOGLOBIN: 14.6 g/dL (ref 13.0–17.0)
LYMPHS ABS: 2.6 10*3/uL (ref 0.7–4.0)
Lymphocytes Relative: 29 % (ref 12–46)
MCH: 30 pg (ref 26.0–34.0)
MCHC: 33.7 g/dL (ref 30.0–36.0)
MCV: 89.1 fL (ref 78.0–100.0)
MPV: 10.3 fL (ref 8.6–12.4)
Monocytes Absolute: 0.8 10*3/uL (ref 0.1–1.0)
Monocytes Relative: 9 % (ref 3–12)
NEUTROS ABS: 5.1 10*3/uL (ref 1.7–7.7)
NEUTROS PCT: 57 % (ref 43–77)
PLATELETS: 252 10*3/uL (ref 150–400)
RBC: 4.86 MIL/uL (ref 4.22–5.81)
RDW: 13.8 % (ref 11.5–15.5)
WBC: 9 10*3/uL (ref 4.0–10.5)

## 2014-11-25 LAB — MAGNESIUM: Magnesium: 1.9 mg/dL (ref 1.5–2.5)

## 2014-11-25 LAB — HEPATIC FUNCTION PANEL
ALT: 23 U/L (ref 9–46)
AST: 22 U/L (ref 10–35)
Albumin: 4 g/dL (ref 3.6–5.1)
Alkaline Phosphatase: 68 U/L (ref 40–115)
BILIRUBIN DIRECT: 0.1 mg/dL (ref <=0.2)
BILIRUBIN INDIRECT: 0.3 mg/dL (ref 0.2–1.2)
BILIRUBIN TOTAL: 0.4 mg/dL (ref 0.2–1.2)
Total Protein: 6.6 g/dL (ref 6.1–8.1)

## 2014-11-25 LAB — LIPID PANEL
CHOL/HDL RATIO: 4.3 ratio (ref <=5.0)
Cholesterol: 166 mg/dL (ref 125–200)
HDL: 39 mg/dL — AB (ref >=40)
LDL CALC: 101 mg/dL (ref <130)
Triglycerides: 129 mg/dL (ref <150)
VLDL: 26 mg/dL (ref <30)

## 2014-11-25 LAB — BASIC METABOLIC PANEL WITH GFR
BUN: 19 mg/dL (ref 7–25)
CHLORIDE: 100 mmol/L (ref 98–110)
CO2: 29 mmol/L (ref 20–31)
Calcium: 9.3 mg/dL (ref 8.6–10.3)
Creat: 1.06 mg/dL (ref 0.70–1.25)
GFR, Est African American: 85 mL/min (ref >=60)
GFR, Est Non African American: 73 mL/min (ref >=60)
Glucose, Bld: 110 mg/dL — ABNORMAL HIGH (ref 65–99)
Potassium: 4.2 mmol/L (ref 3.5–5.3)
Sodium: 141 mmol/L (ref 135–146)

## 2014-11-25 LAB — IRON AND TIBC
%SAT: 17 % (ref 15–60)
Iron: 53 ug/dL (ref 50–180)
TIBC: 312 ug/dL (ref 250–425)
UIBC: 259 ug/dL (ref 125–400)

## 2014-11-25 NOTE — Patient Instructions (Signed)

## 2014-11-25 NOTE — Progress Notes (Signed)
Patient ID: Jason Patton, male   DOB: 03-28-1950, 64 y.o.   MRN: [id number]   Preventative Visit And Comprehensive Evaluation &  Examination  This very nice 64 y.o. MWM presents for Wellness/Preventative visit.  Patient has also  been followed for HTN, CKD 2, Prediabetes, Hyperlipidemia, and Vitamin D Deficiency. Patient recently underwent Lithotripsy.   HTN predates since 2009. Patient's BP has been controlled at home.Today's BP is sl elevated at 142/84. Patient denies any cardiac symptoms as chest pain, palpitations, shortness of breath, dizziness or ankle swelling.   Patient's hyperlipidemia is controlled with diet and medications. Patient denies myalgias or other medication SE's. Last lipids were at goal - Cholesterol 159; HDL 39*; LDL 73; and elevated Triglycerides 236 on 09/02/2014.   Patient has Morbid obesity (BMI 37.39) and consequent prediabetes since Sept 2010 with A1c 5.8% and patient denies reactive hypoglycemic symptoms, visual blurring, diabetic polys or paresthesias. Last A1c was 6.1% on 09/02/2014.   Finally, patient has history of Vitamin D Deficiency of 32 in 2009 and last vitamin D was 72 on 09/02/2014.      Medication Sig  . aspirin 81 MG tablet Take 162 mg by mouth daily.   . bisoprolol-hctz (ZIAC) 10-6.25  TAKE 1 TABLET BY MOUTH EACH DAY FOR BLOOD PRESSURE  . VITAMIN D 5000 UNITS  Take 5,000 Units by mouth daily.  . NORCO/ 5-325 MG  Take 1-2 tablets by mouth every 4 (four) hours as needed for moderate pain.  Marland Kitchen losartan  100 MG tablet Take 1 tablet (100 mg total) by mouth daily.  Marland Kitchen omeprazole  40 MG capsule TAKE 1 CAPSULE BY MOUTH 2 TIMES A DAY.  Marland Kitchen oxyCODONE-acetaminophen  5-325  Take 1 tablet by mouth every 4 (four) hours as needed for severe pain.  . vitamin B-12 50 MCG tablet Take 50 mcg by mouth daily.   Allergies  Allergen Reactions  . Ace Inhibitors Other (See Comments)    Cough   . Citalopram Nausea And Vomiting   Past Medical History  Diagnosis Date  . HTN  (hypertension)   . Hyperlipemia     pt denies 08/08/12  . GERD (gastroesophageal reflux disease)   . Obesity   . History of Helicobacter pylori infection   . Vitamin D deficiency   . Hemorrhoids   . Peripheral vascular disease 9/14    DVT  left lower leg   Health Maintenance  Topic Date Due  . Hepatitis C Screening  06/16/1950  . FOOT EXAM  03/18/1959  . OPHTHALMOLOGY EXAM  03/18/1959  . HIV Screening  03/17/1964  . TETANUS/TDAP  03/17/1968  . COLONOSCOPY  03/18/1999  . ZOSTAVAX  03/17/2009  . PNA vac Low Risk Adult (2 of 2 - PPSV23) 03/17/2014  . INFLUENZA VACCINE  10/04/2014  . URINE MICROALBUMIN  11/25/2014  . HEMOGLOBIN A1C  03/04/2015   Immunization History  Administered Date(s) Administered  . Influenza Split 12/23/2013  . Influenza, High Dose Seasonal PF 11/25/2014  . Pneumococcal Conjugate-13 12/01/2008   Past Surgical History  Procedure Laterality Date  . Hemorrhoid surgery      age 41  . Tonsillectomy    . Cystoscopy/retrograde/ureteroscopy Left 01/02/2013    Procedure: CYSTOSCOPY/LEFT URETERAL WASHINGS/LEFT RETROGRADE PYELOGRAM/ LEFT URETEROSCOPY/URETERAL BIOPSY. BLADDER BIOPSY;  Surgeon: Ardis Hughs, MD;  Location: [facility];  Service: Urology;  Laterality: Left;  With STENT   Family History  Problem Relation Age of Onset  . Diabetes Father   . Heart disease Father   .  Cancer Mother     Bladder   Social History   Social History  . Marital Status: Married    Spouse Name: N/A  . Number of Children: 1  . Years of Education: N/A   Occupational History  . Engineer, materials    Social History Main Topics  . Smoking status: Never Smoker   . Smokeless tobacco: Never Used  . Alcohol Use: No  . Drug Use: No  . Sexual Activity: Not on file   Other Topics Concern  . Not on file   Social History Narrative    ROS Constitutional: Denies fever, chills, weight loss/gain, headaches, insomnia,  night sweats or change in appetite. Does c/o  fatigue. Eyes: Denies redness, blurred vision, diplopia, discharge, itchy or watery eyes.  ENT: Denies discharge, congestion, post nasal drip, epistaxis, sore throat, earache, hearing loss, dental pain, Tinnitus, Vertigo, Sinus pain or snoring.  Cardio: Denies chest pain, palpitations, irregular heartbeat, syncope, dyspnea, diaphoresis, orthopnea, PND, claudication or edema Respiratory: denies cough, dyspnea, DOE, pleurisy, hoarseness, laryngitis or wheezing.  Gastrointestinal: Denies dysphagia, heartburn, reflux, water brash, pain, cramps, nausea, vomiting, bloating, diarrhea, constipation, hematemesis, melena, hematochezia, jaundice or hemorrhoids Genitourinary: Denies dysuria, frequency, urgency, nocturia, hesitancy, discharge, hematuria or flank pain Musculoskeletal: Denies arthralgia, myalgia, stiffness, Jt. Swelling, pain, limp or strain/sprain. Denies Falls. Skin: Denies puritis, rash, hives, warts, acne, eczema or change in skin lesion Neuro: No weakness, tremor, incoordination, spasms, paresthesia or pain Psychiatric: Denies confusion, memory loss or sensory loss. Denies Depression. Endocrine: Denies change in weight, skin, hair change, nocturia, and paresthesia, diabetic polys, visual blurring or hyper / hypo glycemic episodes.  Heme/Lymph: No excessive bleeding, bruising or enlarged lymph nodes.  Physical Exam  BP 142/84   Pulse 76  Temp97.3 F   Resp 16  Ht 5\' 11"    Wt 269 lb 9.6 oz     BMI 37.62   General Appearance: Well nourished &  in no apparent distress. Eyes: PERRLA, EOMs, conjunctiva no swelling or erythema, normal fundi and vessels. Sinuses: No frontal/maxillary tenderness ENT/Mouth: EACs patent / TMs  nl. Nares clear without erythema, swelling, mucoid exudates. Oral hygiene is good. No erythema, swelling, or exudate. Tongue normal, non-obstructing. Tonsils not swollen or erythematous. Hearing normal.  Neck: Supple, thyroid normal. No bruits, nodes or  JVD. Respiratory: Respiratory effort normal.  BS equal and clear bilateral without rales, rhonci, wheezing or stridor. Cardio: Heart sounds are normal with regular rate and rhythm and no murmurs, rubs or gallops. Peripheral pulses are normal and equal bilaterally without edema. No aortic or femoral bruits. Chest: symmetric with normal excursions and percussion.  Abdomen: Flat, soft, with bowel sounds. Nontender, no guarding, rebound, hernias, masses, or organomegaly.  Lymphatics: Non tender without lymphadenopathy.  Genitourinary: No hernias.Testes nl. DRE - prostate nl for age - smooth & firm w/o nodules. Musculoskeletal: Full ROM all peripheral extremities, joint stability, 5/5 strength, and normal gait. Skin: Warm and dry without rashes, lesions, cyanosis, clubbing or  ecchymosis.  Neuro: Cranial nerves intact, reflexes equal bilaterally. Normal muscle tone, no cerebellar symptoms. Sensation intact.  Pysch: Alert and oriented X 3 with normal affect, insight and judgment appropriate.   Assessment and Plan  1. Encounter for general adult medical examination with abnormal findings  - Microalbumin / creatinine urine ratio - EKG 12-Lead - [place], RETROPERITNL ABD,  LTD - Vitamin B12 - PSA - Testosterone - Iron and TIBC - Urinalysis, Routine w reflex microscopic  - CBC with Differential/Platelet - BASIC METABOLIC  PANEL WITH GFR - Hepatic function panel - Magnesium - Lipid panel - TSH - Hemoglobin A1c - Insulin, random - Vit D  25 hydroxy  - Flu vaccine HIGH DOSE PF (Fluzone High dose)  2. Essential hypertension  - EKG 12-Lead - [place], RETROPERITNL ABD,  LTD - TSH  3. Hyperlipemia  - Lipid panel  4. CKD Stage II (GFR 64 ml/min)  - Microalbumin / creatinine urine ratio - Hemoglobin A1c - Insulin, random  5. Vitamin D deficiency  - Vit D  25 hydroxy   6. Morbid obesity (BMI 37.39)    7. BMI 37.0-37.9, adult   8. Screening for rectal cancer   9. Prostate cancer  screening  - PSA  10. Other fatigue  - Vitamin B12 - Testosterone - Iron and TIBC  11. Need for prophylactic vaccination and inoculation against influenza  - Flu vaccine HIGH DOSE PF (Fluzone High dose)  12. Encounter for long-term (current) use of medications  - Urinalysis, Routine w reflex microscopic (not at [facility]) - CBC with Differential/Platelet - BASIC METABOLIC PANEL WITH GFR - Hepatic function panel - Magnesium   Continue prudent diet as discussed, weight control, BP monitoring, regular exercise, and medications as discussed.  Discussed med effects and SE's. Routine screening labs and tests as requested with regular follow-up as recommended.  Over 40 minutes of exam, counseling &  chart review was performed

## 2014-11-26 LAB — URINALYSIS, ROUTINE W REFLEX MICROSCOPIC
Bilirubin Urine: NEGATIVE
GLUCOSE, UA: NEGATIVE
Hgb urine dipstick: NEGATIVE
Ketones, ur: NEGATIVE
Leukocytes, UA: NEGATIVE
NITRITE: NEGATIVE
PH: 5.5 (ref 5.0–8.0)
Protein, ur: NEGATIVE
Specific Gravity, Urine: 1.022 (ref 1.001–1.035)

## 2014-11-26 LAB — HEMOGLOBIN A1C
Hgb A1c MFr Bld: 6.2 % — ABNORMAL HIGH (ref <5.7)
Mean Plasma Glucose: 131 mg/dL — ABNORMAL HIGH (ref <117)

## 2014-11-26 LAB — VITAMIN D 25 HYDROXY (VIT D DEFICIENCY, FRACTURES): Vit D, 25-Hydroxy: 75 ng/mL (ref 30–100)

## 2014-11-26 LAB — MICROALBUMIN / CREATININE URINE RATIO
CREATININE, URINE: 189.9 mg/dL
Microalb Creat Ratio: 6.3 mg/g (ref 0.0–30.0)
Microalb, Ur: 1.2 mg/dL (ref <2.0)

## 2014-11-26 LAB — TSH: TSH: 1.331 u[IU]/mL (ref 0.350–4.500)

## 2014-11-26 LAB — TESTOSTERONE: TESTOSTERONE: 378 ng/dL (ref 300–890)

## 2014-11-26 LAB — PSA: PSA: 4.72 ng/mL — ABNORMAL HIGH (ref <=4.00)

## 2014-11-26 LAB — VITAMIN B12: VITAMIN B 12: 1032 pg/mL — AB (ref 211–911)

## 2014-11-26 LAB — INSULIN, RANDOM: Insulin: 36.7 u[IU]/mL — ABNORMAL HIGH (ref 2.0–19.6)

## 2015-01-12 ENCOUNTER — Encounter: Payer: Self-pay | Admitting: Internal Medicine

## 2015-01-12 ENCOUNTER — Ambulatory Visit (INDEPENDENT_AMBULATORY_CARE_PROVIDER_SITE_OTHER): Payer: Managed Care, Other (non HMO) | Admitting: Internal Medicine

## 2015-01-12 VITALS — BP 162/94 | HR 100 | Temp 97.8°F | Resp 18 | Ht 71.0 in | Wt 269.0 lb

## 2015-01-12 DIAGNOSIS — K5732 Diverticulitis of large intestine without perforation or abscess without bleeding: Secondary | ICD-10-CM | POA: Diagnosis not present

## 2015-01-12 MED ORDER — HYOSCYAMINE SULFATE 0.125 MG PO TABS: ORAL_TABLET | ORAL | Status: DC

## 2015-01-12 MED ORDER — CIPROFLOXACIN HCL 500 MG PO TABS: ORAL_TABLET | ORAL | Status: DC

## 2015-01-12 MED ORDER — HYDROCODONE-ACETAMINOPHEN 5-325 MG PO TABS: ORAL_TABLET | ORAL | Status: AC

## 2015-01-12 MED ORDER — METRONIDAZOLE 500 MG PO TABS: ORAL_TABLET | ORAL | Status: DC

## 2015-01-12 NOTE — Progress Notes (Signed)
Subjective:    Patient ID: Jason Patton, male    DOB: 07-Apr-1950, 64 y.o.   MRN: [id number]  HPI   This very nice 64 yo MWM presented with 1-2 day hx/o intermittant LLQ pains, bloating and N w/o emesis.   Medication Sig  . aspirin 81 MG tablet Take 162 mg by mouth daily.   . bisoprolol-hctz (ZIAC) 10-6.25 MG per tablet TAKE 1 TABLET BY MOUTH EACH DAY FOR BLOOD PRESSURE  . VITAMIN D 5000 UNITS CAPS Take 5,000 Units by mouth daily.  Marland Kitchen losartan  100 MG tablet Take 1 tablet (100 mg total) by mouth daily.  Marland Kitchen omeprazole  40 MG capsule TAKE 1 CAPSULE BY MOUTH 2 TIMES A DAY.  . tamsulosin  0.4 MG CAPS capsule Take 1 capsule (0.4 mg total) by mouth daily after supper.  . vitamin B-12  50 MCG tablet Take 50 mcg by mouth daily.   Allergies  Allergen Reactions  . Ace Inhibitors Other (See Comments)    Cough   . Citalopram Nausea And Vomiting   Past Medical History  Diagnosis Date  . HTN (hypertension)   . Hyperlipemia     pt denies 08/08/12  . GERD (gastroesophageal reflux disease)   . Obesity   . History of Helicobacter pylori infection   . Vitamin D deficiency   . Hemorrhoids   . Peripheral vascular disease ([place]) 9/14    DVT  left lower leg   Past Surgical History  Procedure Laterality Date  . Hemorrhoid surgery      age 37  . Tonsillectomy    . Cystoscopy/retrograde/ureteroscopy Left 01/02/2013    Procedure: CYSTOSCOPY/LEFT URETERAL WASHINGS/LEFT RETROGRADE PYELOGRAM/ LEFT URETEROSCOPY/URETERAL BIOPSY. BLADDER BIOPSY;  Surgeon: Ardis Hughs, MD;  Location: [facility];  Service: Urology;  Laterality: Left;  With STENT   Review of Systems  10 point systems review negative except as above.    Objective:   Physical Exam  BP 162/94 mmHg  Pulse 100  Temp(Src) 97.8 F (36.6 C) (Temporal)  Resp 18  Ht 5\' 11"  (1.803 m)  Wt 269 lb (122.018 kg)  BMI 37.53 kg/m2  HEENT - Eac's patent. TM's Nl. EOM's full. PERRLA. NasoOroPharynx clear. Neck - supple. Nl Thyroid. Carotids 2+ & No  bruits, nodes, JVD Chest - Clear equal BS w/o Rales, rhonchi, wheezes. Cor - Nl HS. RRR w/o sig MGR. PP 1(+). No edema. Abd - Sl protuberant, but soft exam  With  Sl tenderness over the LLQ w/o RB or guarding. BS nl. MS- FROM w/o deformities. Muscle power, tone and bulk Nl. Gait Nl. Neuro - No obvious Cr N abnormalities. Sensory, motor and Cerebellar functions appear Nl w/o focal abnormalities. Psyche - Mental status normal & appropriate.  No delusions, ideations or obvious mood abnormalities.    Assessment & Plan:   1. Diverticulitis of colon  - ciprofloxacin (CIPRO) 500 MG tablet; Take 1 tablet 2 x day with food for infection  Dispense: 20 tablet; Refill: 1 - metroNIDAZOLE (FLAGYL) 500 MG tablet; Take 1 tablet 3 x day with food of infection  Dispense: 30 tablet; Refill: 1  - hyoscyamine (LEVSIN) 0.125 MG tablet; Take 1 to 2 tablets 4 x day or every 4 hours if needed for Nausea, bloating or diarrhea  Dispense: 90 tablet; Refill: 0  - HYDROcodone-acetaminophen (NORCO) 5-325 MG tablet; Take 1/2 to 1 tablet every 4 hours if needed for pain  Dispense: 30 tablet; Refill: 0  - Diet and meds/SE's  discussed.  -  ROV - prn

## 2015-01-12 NOTE — Patient Instructions (Signed)
Diverticulitis °Diverticulitis is inflammation or infection of small pouches in your colon that form when you have a condition called diverticulosis. The pouches in your colon are called diverticula. Your colon, or large intestine, is where water is absorbed and stool is formed. °Complications of diverticulitis can include: °· Bleeding. °· Severe infection. °· Severe pain. °· Perforation of your colon. °· Obstruction of your colon. °CAUSES  °Diverticulitis is caused by bacteria. °Diverticulitis happens when stool becomes trapped in diverticula. This allows bacteria to grow in the diverticula, which can lead to inflammation and infection. °RISK FACTORS °People with diverticulosis are at risk for diverticulitis. Eating a diet that does not include enough fiber from fruits and vegetables may make diverticulitis more likely to develop. °SYMPTOMS  °Symptoms of diverticulitis may include: °· Abdominal pain and tenderness. The pain is normally located on the left side of the abdomen, but may occur in other areas. °· Fever and chills. °· Bloating. °· Cramping. °· Nausea. °· Vomiting. °· Constipation. °· Diarrhea. °· Blood in your stool. °DIAGNOSIS  °Your health care provider will ask you about your medical history and do a physical exam. You may need to have tests done because many medical conditions can cause the same symptoms as diverticulitis. Tests may include: °· Blood tests. °· Urine tests. °· Imaging tests of the abdomen, including X-rays and CT scans. °When your condition is under control, your health care provider may recommend that you have a colonoscopy. A colonoscopy can show how severe your diverticula are and whether something else is causing your symptoms. °TREATMENT  °Most cases of diverticulitis are mild and can be treated at home. Treatment may include: °· Taking over-the-counter pain medicines. °· Following a clear liquid diet. °· Taking antibiotic medicines by mouth for 7-10 days. °More severe cases may  be treated at a hospital. Treatment may include: °· Not eating or drinking. °· Taking prescription pain medicine. °· Receiving antibiotic medicines through an IV tube. °· Receiving fluids and nutrition through an IV tube. °· Surgery. °HOME CARE INSTRUCTIONS  °· Follow your health care provider's instructions carefully. °· Follow a full liquid diet or other diet as directed by your health care provider. After your symptoms improve, your health care provider may tell you to change your diet. He or she may recommend you eat a high-fiber diet. Fruits and vegetables are good sources of fiber. Fiber makes it easier to pass stool. °· Take fiber supplements or probiotics as directed by your health care provider. °· Only take medicines as directed by your health care provider. °· Keep all your follow-up appointments. °SEEK MEDICAL CARE IF:  °· Your pain does not improve. °· You have a hard time eating food. °· Your bowel movements do not return to normal. °SEEK IMMEDIATE MEDICAL CARE IF:  °· Your pain becomes worse. °· Your symptoms do not get better. °· Your symptoms suddenly get worse. °· You have a fever. °· You have repeated vomiting. °· You have bloody or black, tarry stools. °MAKE SURE YOU:  °· Understand these instructions. °· Will watch your condition. °· Will get help right away if you are not doing well or get worse. °  °This information is not intended to replace advice given to you by your health care provider. Make sure you discuss any questions you have with your health care provider. °  °Document Released: 11/29/2004 Document Revised: 02/24/2013 Document Reviewed: 01/14/2013 °Elsevier Interactive Patient Education ©2016 [employer] ° °

## 2015-02-23 ENCOUNTER — Other Ambulatory Visit: Payer: Self-pay | Admitting: Internal Medicine

## 2015-03-02 ENCOUNTER — Other Ambulatory Visit: Payer: Self-pay | Admitting: Internal Medicine

## 2015-03-02 ENCOUNTER — Ambulatory Visit (INDEPENDENT_AMBULATORY_CARE_PROVIDER_SITE_OTHER): Payer: Managed Care, Other (non HMO) | Admitting: Internal Medicine

## 2015-03-02 ENCOUNTER — Encounter: Payer: Self-pay | Admitting: Internal Medicine

## 2015-03-02 VITALS — BP 140/82 | HR 76 | Temp 97.8°F | Resp 20 | Ht 71.0 in | Wt 274.0 lb

## 2015-03-02 DIAGNOSIS — I10 Essential (primary) hypertension: Secondary | ICD-10-CM | POA: Diagnosis not present

## 2015-03-02 DIAGNOSIS — R7303 Prediabetes: Secondary | ICD-10-CM | POA: Diagnosis not present

## 2015-03-02 DIAGNOSIS — Z79899 Other long term (current) drug therapy: Secondary | ICD-10-CM

## 2015-03-02 DIAGNOSIS — E785 Hyperlipidemia, unspecified: Secondary | ICD-10-CM | POA: Diagnosis not present

## 2015-03-02 DIAGNOSIS — E559 Vitamin D deficiency, unspecified: Secondary | ICD-10-CM

## 2015-03-02 LAB — CBC WITH DIFFERENTIAL/PLATELET
BASOS ABS: 0 10*3/uL (ref 0.0–0.1)
Basophils Relative: 0 % (ref 0–1)
EOS PCT: 4 % (ref 0–5)
Eosinophils Absolute: 0.4 10*3/uL (ref 0.0–0.7)
HEMATOCRIT: 47.6 % (ref 39.0–52.0)
Hemoglobin: 16.1 g/dL (ref 13.0–17.0)
LYMPHS ABS: 3 10*3/uL (ref 0.7–4.0)
LYMPHS PCT: 31 % (ref 12–46)
MCH: 29.8 pg (ref 26.0–34.0)
MCHC: 33.8 g/dL (ref 30.0–36.0)
MCV: 88.1 fL (ref 78.0–100.0)
MPV: 10.1 fL (ref 8.6–12.4)
Monocytes Absolute: 1.2 10*3/uL — ABNORMAL HIGH (ref 0.1–1.0)
Monocytes Relative: 12 % (ref 3–12)
Neutro Abs: 5.2 10*3/uL (ref 1.7–7.7)
Neutrophils Relative %: 53 % (ref 43–77)
PLATELETS: 236 10*3/uL (ref 150–400)
RBC: 5.4 MIL/uL (ref 4.22–5.81)
RDW: 13.6 % (ref 11.5–15.5)
WBC: 9.8 10*3/uL (ref 4.0–10.5)

## 2015-03-02 NOTE — Progress Notes (Signed)
Patient ID: KAVYN RAMKISSOON, male   DOB: Feb 02, 1951, 64 y.o.   MRN: [id number]  Assessment and Plan:  Hypertension:  -Continue medication,  -monitor blood pressure at home.  -Continue DASH diet.   -Reminder to go to the ER if any CP, SOB, nausea, dizziness, severe HA, changes vision/speech, left arm numbness and tingling, and jaw pain.  Cholesterol: -Continue diet and exercise.  -Check cholesterol.   Pre-diabetes: -Continue diet and exercise.  -Check A1C  Vitamin D Def: -check level -continue medications.   History of Kidney Stones -cont hydration -cont Vit C -followed by Dr. Louis Meckel  Continue diet and meds as discussed. Further disposition pending results of labs.  HPI 64 y.o. male  presents for 3 month follow up with hypertension, hyperlipidemia, prediabetes and vitamin D.   His blood pressure has been controlled at home, today their BP is BP: 140/82 mmHg.   He does not workout. He denies chest pain, shortness of breath, dizziness.   He is not on cholesterol medication and denies myalgias. His cholesterol is at goal. The cholesterol last visit was:   Lab Results  Component Value Date   CHOL 166 11/25/2014   HDL 39* 11/25/2014   LDLCALC 101 11/25/2014   TRIG 129 11/25/2014   CHOLHDL 4.3 11/25/2014     He has been working on diet and exercise for prediabetes, and denies foot ulcerations, hyperglycemia, hypoglycemia , increased appetite, nausea, paresthesia of the feet, polydipsia, polyuria, visual disturbances, vomiting and weight loss. Last A1C in the office was:  Lab Results  Component Value Date   HGBA1C 6.2* 11/25/2014    Patient is on Vitamin D supplement.  Lab Results  Component Value Date   VD25OH 49 11/25/2014     Patient reports that he has had kidney stones most recently and has been under the care of Dr. Louis Meckel.  He most recently passed a stone several months ago.     Current Medications:  Current Outpatient Prescriptions on File Prior to Visit   Medication Sig Dispense Refill  . aspirin 81 MG tablet Take 162 mg by mouth daily.     . bisoprolol-hydrochlorothiazide (ZIAC) 10-6.25 MG tablet TAKE 1 TABLET BY MOUTH EACH DAY FOR BLOOD PRESSURE 90 tablet 1  . Cholecalciferol (VITAMIN D3) 5000 UNITS CAPS Take 5,000 Units by mouth daily.    Marland Kitchen losartan (COZAAR) 100 MG tablet TAKE 1 TABLET BY MOUTH DAILY. 90 tablet 1  . omeprazole (PRILOSEC) 40 MG capsule TAKE 1 CAPSULE BY MOUTH 2 TIMES A DAY. 60 capsule 3  . vitamin B-12 (CYANOCOBALAMIN) 50 MCG tablet Take 50 mcg by mouth daily.     No current facility-administered medications on file prior to visit.    Medical History:  Past Medical History  Diagnosis Date  . HTN (hypertension)   . Hyperlipemia     pt denies 08/08/12  . GERD (gastroesophageal reflux disease)   . Obesity   . History of Helicobacter pylori infection   . Vitamin D deficiency   . Hemorrhoids   . Peripheral vascular disease ([place]) 9/14    DVT  left lower leg    Allergies:  Allergies  Allergen Reactions  . Ace Inhibitors Other (See Comments)    Cough   . Citalopram Nausea And Vomiting     Review of Systems:  Review of Systems  Constitutional: Negative for fever, chills and malaise/fatigue.  HENT: Negative for congestion, ear pain and sore throat.   Eyes: Negative.   Respiratory: Negative for cough, shortness  of breath and wheezing.   Cardiovascular: Negative for chest pain, palpitations and leg swelling.  Gastrointestinal: Negative for heartburn, abdominal pain, diarrhea, constipation, blood in stool and melena.  Genitourinary: Negative.   Skin: Negative.   Neurological: Negative for dizziness, sensory change, loss of consciousness and headaches.  Psychiatric/Behavioral: Negative for depression. The patient is not nervous/anxious and does not have insomnia.     Family history- Review and unchanged  Social history- Review and unchanged  Physical Exam: BP 140/82 mmHg  Pulse 76  Temp(Src) 97.8 F (36.6  C) (Temporal)  Resp 20  Ht 5\' 11"  (1.803 m)  Wt 274 lb (124.286 kg)  BMI 38.23 kg/m2 Wt Readings from Last 3 Encounters:  03/02/15 274 lb (124.286 kg)  01/12/15 269 lb (122.018 kg)  11/25/14 269 lb 9.6 oz (122.29 kg)    General Appearance: Well nourished well developed, in no apparent distress. Eyes: PERRLA, EOMs, conjunctiva no swelling or erythema ENT/Mouth: Ear canals normal without obstruction, swelling, erythma, discharge.  TMs normal bilaterally.  Oropharynx moist, clear, without exudate, or postoropharyngeal swelling. Neck: Supple, thyroid normal,no cervical adenopathy  Respiratory: Respiratory effort normal, Breath sounds clear A&P without rhonchi, wheeze, or rale.  No retractions, no accessory usage. Cardio: RRR with no MRGs. Brisk peripheral pulses without edema.  Abdomen: Soft, + BS,  Non tender, no guarding, rebound, hernias, masses. Musculoskeletal: Full ROM, 5/5 strength, Normal gait Skin: Warm, dry without rashes, lesions, ecchymosis.  Neuro: Awake and oriented X 3, Cranial nerves intact. Normal muscle tone, no cerebellar symptoms. Psych: Normal affect, Insight and Judgment appropriate.    Starlyn Skeans, PA-C 3:41 PM [facility]

## 2015-03-03 LAB — BASIC METABOLIC PANEL WITH GFR
BUN: 19 mg/dL (ref 7–25)
CALCIUM: 9.4 mg/dL (ref 8.6–10.3)
CO2: 29 mmol/L (ref 20–31)
CREATININE: 1.26 mg/dL — AB (ref 0.70–1.25)
Chloride: 97 mmol/L — ABNORMAL LOW (ref 98–110)
GFR, Est African American: 69 mL/min (ref >=60)
GFR, Est Non African American: 59 mL/min — ABNORMAL LOW (ref >=60)
Glucose, Bld: 91 mg/dL (ref 65–99)
Potassium: 4.1 mmol/L (ref 3.5–5.3)
SODIUM: 140 mmol/L (ref 135–146)

## 2015-03-03 LAB — HEMOGLOBIN A1C
Hgb A1c MFr Bld: 6.2 % — ABNORMAL HIGH (ref <5.7)
MEAN PLASMA GLUCOSE: 131 mg/dL — AB (ref <117)

## 2015-03-03 LAB — LIPID PANEL
CHOL/HDL RATIO: 3.6 ratio (ref <=5.0)
CHOLESTEROL: 162 mg/dL (ref 125–200)
HDL: 45 mg/dL (ref >=40)
LDL Cholesterol: 90 mg/dL (ref <130)
TRIGLYCERIDES: 135 mg/dL (ref <150)
VLDL: 27 mg/dL (ref <30)

## 2015-03-03 LAB — HEPATIC FUNCTION PANEL
ALT: 32 U/L (ref 9–46)
AST: 31 U/L (ref 10–35)
Albumin: 4.1 g/dL (ref 3.6–5.1)
Alkaline Phosphatase: 70 U/L (ref 40–115)
BILIRUBIN DIRECT: 0.1 mg/dL (ref <=0.2)
BILIRUBIN TOTAL: 0.4 mg/dL (ref 0.2–1.2)
Indirect Bilirubin: 0.3 mg/dL (ref 0.2–1.2)
Total Protein: 7 g/dL (ref 6.1–8.1)

## 2015-03-03 LAB — TSH: TSH: 1.761 u[IU]/mL (ref 0.350–4.500)

## 2015-06-01 ENCOUNTER — Encounter: Payer: Self-pay | Admitting: Internal Medicine

## 2015-06-01 ENCOUNTER — Ambulatory Visit (INDEPENDENT_AMBULATORY_CARE_PROVIDER_SITE_OTHER): Payer: Managed Care, Other (non HMO) | Admitting: Internal Medicine

## 2015-06-01 ENCOUNTER — Other Ambulatory Visit: Payer: Self-pay | Admitting: *Deleted

## 2015-06-01 VITALS — BP 122/84 | HR 68 | Temp 97.7°F | Resp 16 | Ht 71.0 in | Wt 273.6 lb

## 2015-06-01 DIAGNOSIS — Z79899 Other long term (current) drug therapy: Secondary | ICD-10-CM

## 2015-06-01 DIAGNOSIS — E785 Hyperlipidemia, unspecified: Secondary | ICD-10-CM

## 2015-06-01 DIAGNOSIS — R7303 Prediabetes: Secondary | ICD-10-CM

## 2015-06-01 DIAGNOSIS — I10 Essential (primary) hypertension: Secondary | ICD-10-CM

## 2015-06-01 DIAGNOSIS — E559 Vitamin D deficiency, unspecified: Secondary | ICD-10-CM

## 2015-06-01 LAB — CBC WITH DIFFERENTIAL/PLATELET
Basophils Absolute: 0 10*3/uL (ref 0.0–0.1)
Basophils Relative: 0 % (ref 0–1)
EOS ABS: 0.3 10*3/uL (ref 0.0–0.7)
EOS PCT: 4 % (ref 0–5)
HEMATOCRIT: 46.7 % (ref 39.0–52.0)
Hemoglobin: 16.2 g/dL (ref 13.0–17.0)
LYMPHS ABS: 2.4 10*3/uL (ref 0.7–4.0)
LYMPHS PCT: 30 % (ref 12–46)
MCH: 31.3 pg (ref 26.0–34.0)
MCHC: 34.7 g/dL (ref 30.0–36.0)
MCV: 90.3 fL (ref 78.0–100.0)
MONO ABS: 0.9 10*3/uL (ref 0.1–1.0)
MONOS PCT: 11 % (ref 3–12)
MPV: 9.8 fL (ref 8.6–12.4)
Neutro Abs: 4.5 10*3/uL (ref 1.7–7.7)
Neutrophils Relative %: 55 % (ref 43–77)
PLATELETS: 221 10*3/uL (ref 150–400)
RBC: 5.17 MIL/uL (ref 4.22–5.81)
RDW: 13.6 % (ref 11.5–15.5)
WBC: 8.1 10*3/uL (ref 4.0–10.5)

## 2015-06-01 MED ORDER — BISOPROLOL-HYDROCHLOROTHIAZIDE 10-6.25 MG PO TABS: ORAL_TABLET | ORAL | Status: DC

## 2015-06-01 MED ORDER — OMEPRAZOLE 40 MG PO CPDR: DELAYED_RELEASE_CAPSULE | ORAL | Status: DC

## 2015-06-01 MED ORDER — LOSARTAN POTASSIUM 100 MG PO TABS: 100.0000 mg | ORAL_TABLET | Freq: Every day | ORAL | Status: DC

## 2015-06-01 NOTE — Progress Notes (Signed)
Patient ID: Jason Patton, male   DOB: [birth year], 65 y.o.   MRN: [id number]   This very nice 65 y.o. MWM presents for 3 month follow up with Hypertension, Hyperlipidemia, Pre-Diabetes and Vitamin D Deficiency. His GDERD is controlled with his meds.    Patient is treated for HTN circa 2009 & BP has been controlled at home. Today's BP: 122/84 mmHg. Patient has had no complaints of any cardiac type chest pain, palpitations, dyspnea/orthopnea/PND, dizziness, claudication, or dependent edema.   Hyperlipidemia is controlled with diet & meds. Patient denies myalgias or other med SE's. Last Lipids were at goal with  Cholesterol 162; HDL 45; LDL 90; Triglycerides 135 on 03/02/2015.    Also, the patient has history of Morbid Obesity (BMI 38+) and consequent PreDiabetes in 2009 with A1c 5.9% and has had no symptoms of reactive hypoglycemia, diabetic polys, paresthesias or visual blurring.  Last A1c was 6.2% on 03/02/2015.   Further, the patient also has history of Vitamin D Deficiency  Of "32" on 2009and supplements vitamin D without any suspected side-effects. Last vitamin D was 75 on 11/25/2014.  Medication Sig  . aspirin 81 MG  Take 162 mg daily.   Marland Kitchen VITAMIN D3 5000 UNITS Take  daily.  . vitamin B-12  50 MCG Take 50daily.  . bisoprolol-hctz 10-6.25 TAKE 1 TAB EACH DAY FOR BP  . losartan  100 MG  TAKE 1 TAB DAILY.  Marland Kitchen omeprazole  40 MG  TAKE 1 CAP 2 TIMES A DAY.   Allergies  Allergen Reactions  . Ace Inhibitors Cough  . Citalopram Nausea And Vomiting   PMHx:   Past Medical History  Diagnosis Date  . HTN (hypertension)   . Hyperlipemia     pt denies 08/08/12  . GERD (gastroesophageal reflux disease)   . Obesity   . History of Helicobacter pylori infection   . Vitamin D deficiency   . Hemorrhoids   . Peripheral vascular disease ([place]) 9/14    DVT  left lower leg   Immunization History  Administered Date(s) Administered  . Influenza Split 12/23/2013  . Influenza, High Dose Seasonal PF  11/25/2014  . Pneumococcal Conjugate-13 12/01/2008   Past Surgical History  Procedure Laterality Date  . Hemorrhoid surgery      age 58  . Tonsillectomy    . Cystoscopy/retrograde/ureteroscopy Left 01/02/2013    Procedure: CYSTOSCOPY/LEFT URETERAL WASHINGS/LEFT RETROGRADE PYELOGRAM/ LEFT URETEROSCOPY/URETERAL BIOPSY. BLADDER BIOPSY;  Surgeon: Ardis Hughs, MD;  Location: [facility];  Service: Urology;  Laterality: Left;  With STENT   FHx:    Reviewed / unchanged  SHx:    Reviewed / unchanged  Systems Review:  Constitutional: Denies fever, chills, wt changes, headaches, insomnia, fatigue, night sweats, change in appetite. Eyes: Denies redness, blurred vision, diplopia, discharge, itchy, watery eyes.  ENT: Denies discharge, congestion, post nasal drip, epistaxis, sore throat, earache, hearing loss, dental pain, tinnitus, vertigo, sinus pain, snoring.  CV: Denies chest pain, palpitations, irregular heartbeat, syncope, dyspnea, diaphoresis, orthopnea, PND, claudication or edema. Respiratory: denies cough, dyspnea, DOE, pleurisy, hoarseness, laryngitis, wheezing.  Gastrointestinal: Denies dysphagia, odynophagia, heartburn, reflux, water brash, abdominal pain or cramps, nausea, vomiting, bloating, diarrhea, constipation, hematemesis, melena, hematochezia  or hemorrhoids. Genitourinary: Denies dysuria, frequency, urgency, nocturia, hesitancy, discharge, hematuria or flank pain. Musculoskeletal: Denies arthralgias, myalgias, stiffness, jt. swelling, pain, limping or strain/sprain.  Skin: Denies pruritus, rash, hives, warts, acne, eczema or change in skin lesion(s). Neuro: No weakness, tremor, incoordination, spasms, paresthesia or pain. Psychiatric: Denies confusion, memory  loss or sensory loss. Endo: Denies change in weight, skin or hair change.  Heme/Lymph: No excessive bleeding, bruising or enlarged lymph nodes.  Physical Exam  BP 122/84 mmHg  Pulse 68  Temp(Src) 97.7 F (36.5 C)   Resp 16  Ht 5\' 11"  (1.803 m)  Wt 273 lb 9.6 oz (124.104 kg)  BMI 38.18 kg/m2  Appears over nourished with central obesity and in no distress.  Eyes: PERRLA, EOMs, conjunctiva no swelling or erythema. Sinuses: No frontal/maxillary tenderness ENT/Mouth: EAC's clear, TM's nl w/o erythema, bulging. Nares clear w/o erythema, swelling, exudates. Oropharynx clear without erythema or exudates. Oral hygiene is good. Tongue normal, non obstructing. Hearing intact.  Neck: Supple. Thyroid nl. Car 2+/2+ without bruits, nodes or JVD. Chest: Respirations nl with BS clear & equal w/o rales, rhonchi, wheezing or stridor.  Cor: Heart sounds normal w/ regular rate and rhythm without sig. murmurs, gallops, clicks, or rubs. Peripheral pulses normal and equal  without edema.  Abdomen: Soft & bowel sounds normal. Non-tender w/o guarding, rebound, hernias, masses, or organomegaly.  Lymphatics: Unremarkable.  Musculoskeletal: Full ROM all peripheral extremities, joint stability, 5/5 strength, and normal gait.  Skin: Warm, dry without exposed rashes, lesions or ecchymosis apparent.  Neuro: Cranial nerves intact, reflexes equal bilaterally. Sensory-motor testing grossly intact. Tendon reflexes grossly intact.  Pysch: Alert & oriented x 3.  Insight and judgement nl & appropriate. No ideations.  Assessment and Plan:  1. Essential hypertension  - TSH  2. Hyperlipemia  - Lipid panel - TSH  3. Prediabetes  - Hemoglobin A1c - Insulin, random  4. Vitamin D deficiency  - VITAMIN D 25 Hydroxy   5. Medication management  - CBC with Differential/Platelet - BASIC METABOLIC PANEL WITH GFR - Hepatic function panel - Magnesium   Recommended regular exercise, BP monitoring, weight control, and discussed med and SE's. Recommended labs to assess and monitor clinical status. Further disposition pending results of labs. Over 30 minutes of exam, counseling, chart review was performed

## 2015-06-01 NOTE — Patient Instructions (Signed)

## 2015-06-02 LAB — VITAMIN D 25 HYDROXY (VIT D DEFICIENCY, FRACTURES): Vit D, 25-Hydroxy: 65 ng/mL (ref 30–100)

## 2015-06-02 LAB — BASIC METABOLIC PANEL WITH GFR
BUN: 15 mg/dL (ref 7–25)
CO2: 31 mmol/L (ref 20–31)
CREATININE: 1.22 mg/dL (ref 0.70–1.25)
Calcium: 10.1 mg/dL (ref 8.6–10.3)
Chloride: 97 mmol/L — ABNORMAL LOW (ref 98–110)
GFR, EST AFRICAN AMERICAN: 71 mL/min (ref >=60)
GFR, Est Non African American: 61 mL/min (ref >=60)
Glucose, Bld: 90 mg/dL (ref 65–99)
POTASSIUM: 4.6 mmol/L (ref 3.5–5.3)
Sodium: 138 mmol/L (ref 135–146)

## 2015-06-02 LAB — HEPATIC FUNCTION PANEL
ALT: 27 U/L (ref 9–46)
AST: 22 U/L (ref 10–35)
Albumin: 4.2 g/dL (ref 3.6–5.1)
Alkaline Phosphatase: 69 U/L (ref 40–115)
BILIRUBIN DIRECT: 0.1 mg/dL (ref <=0.2)
BILIRUBIN INDIRECT: 0.4 mg/dL (ref 0.2–1.2)
BILIRUBIN TOTAL: 0.5 mg/dL (ref 0.2–1.2)
TOTAL PROTEIN: 6.8 g/dL (ref 6.1–8.1)

## 2015-06-02 LAB — TSH: TSH: 1.64 m[IU]/L (ref 0.40–4.50)

## 2015-06-02 LAB — LIPID PANEL
Cholesterol: 165 mg/dL (ref 125–200)
HDL: 42 mg/dL (ref >=40)
LDL CALC: 90 mg/dL (ref <130)
Total CHOL/HDL Ratio: 3.9 Ratio (ref <=5.0)
Triglycerides: 166 mg/dL — ABNORMAL HIGH (ref <150)
VLDL: 33 mg/dL — AB (ref <30)

## 2015-06-02 LAB — HEMOGLOBIN A1C
HEMOGLOBIN A1C: 6 % — AB (ref <5.7)
MEAN PLASMA GLUCOSE: 126 mg/dL

## 2015-06-02 LAB — MAGNESIUM: Magnesium: 2 mg/dL (ref 1.5–2.5)

## 2015-06-02 LAB — INSULIN, RANDOM: Insulin: 11.9 u[IU]/mL (ref 2.0–19.6)

## 2015-09-08 ENCOUNTER — Encounter: Payer: Self-pay | Admitting: Physician Assistant

## 2015-09-08 ENCOUNTER — Ambulatory Visit (INDEPENDENT_AMBULATORY_CARE_PROVIDER_SITE_OTHER): Payer: Managed Care, Other (non HMO) | Admitting: Physician Assistant

## 2015-09-08 VITALS — BP 132/74 | HR 72 | Temp 97.7°F | Resp 14 | Ht 71.0 in | Wt 277.8 lb

## 2015-09-08 DIAGNOSIS — I10 Essential (primary) hypertension: Secondary | ICD-10-CM

## 2015-09-08 DIAGNOSIS — E785 Hyperlipidemia, unspecified: Secondary | ICD-10-CM | POA: Diagnosis not present

## 2015-09-08 DIAGNOSIS — R7303 Prediabetes: Secondary | ICD-10-CM | POA: Diagnosis not present

## 2015-09-08 DIAGNOSIS — Z79899 Other long term (current) drug therapy: Secondary | ICD-10-CM

## 2015-09-08 DIAGNOSIS — E559 Vitamin D deficiency, unspecified: Secondary | ICD-10-CM | POA: Diagnosis not present

## 2015-09-08 DIAGNOSIS — N182 Chronic kidney disease, stage 2 (mild): Secondary | ICD-10-CM

## 2015-09-08 LAB — CBC WITH DIFFERENTIAL/PLATELET
Basophils Absolute: 0 cells/uL (ref 0–200)
Basophils Relative: 0 %
EOS ABS: 465 {cells}/uL (ref 15–500)
Eosinophils Relative: 5 %
HCT: 45.3 % (ref 38.5–50.0)
HEMOGLOBIN: 15.3 g/dL (ref 13.2–17.1)
LYMPHS ABS: 2325 {cells}/uL (ref 850–3900)
Lymphocytes Relative: 25 %
MCH: 30.2 pg (ref 27.0–33.0)
MCHC: 33.8 g/dL (ref 32.0–36.0)
MCV: 89.3 fL (ref 80.0–100.0)
MONO ABS: 1023 {cells}/uL — AB (ref 200–950)
MPV: 10.2 fL (ref 7.5–12.5)
Monocytes Relative: 11 %
Neutro Abs: 5487 cells/uL (ref 1500–7800)
Neutrophils Relative %: 59 %
PLATELETS: 239 10*3/uL (ref 140–400)
RBC: 5.07 MIL/uL (ref 4.20–5.80)
RDW: 14 % (ref 11.0–15.0)
WBC: 9.3 10*3/uL (ref 3.8–10.8)

## 2015-09-08 NOTE — Patient Instructions (Signed)

## 2015-09-08 NOTE — Progress Notes (Signed)
Patient ID: Jason Patton, male   DOB: 1950/03/07, 65 y.o.   MRN: [id number]  Assessment and Plan:  Hypertension:  -Continue medication,  -monitor blood pressure at home.  -Continue DASH diet.   -Reminder to go to the ER if any CP, SOB, nausea, dizziness, severe HA, changes vision/speech, left arm numbness and tingling, and jaw pain.  Cholesterol: -Continue diet and exercise.  -Check cholesterol.   Pre-diabetes: -Continue diet and exercise.  -Check A1C  Vitamin D Def: -check level -continue medications.   Morbid Obesity with co morbidities - long discussion about weight loss, diet, and exercise    Continue diet and meds as discussed. Further disposition pending results of labs.  HPI 65 y.o. male  presents for 3 month follow up with hypertension, hyperlipidemia, prediabetes and vitamin D.   His blood pressure has been controlled at home, today their BP is BP: 132/74 mmHg.   He does not workout. He denies chest pain, shortness of breath, dizziness.  He is not on cholesterol medication and denies myalgias. His cholesterol is at goal. The cholesterol last visit was:   Lab Results  Component Value Date   CHOL 165 06/01/2015   HDL 42 06/01/2015   LDLCALC 90 06/01/2015   TRIG 166* 06/01/2015   CHOLHDL 3.9 06/01/2015    He has been working on diet and exercise for prediabetes, and denies foot ulcerations, hyperglycemia, hypoglycemia , increased appetite, nausea, paresthesia of the feet, polydipsia, polyuria, visual disturbances, vomiting and weight loss. Last A1C in the office was:  Lab Results  Component Value Date   HGBA1C 6.0* 06/01/2015   Patient is on Vitamin D supplement.  Lab Results  Component Value Date   VD25OH 41 06/01/2015      Current Medications:  Current Outpatient Prescriptions on File Prior to Visit  Medication Sig Dispense Refill  . aspirin 81 MG tablet Take 162 mg by mouth daily.     . bisoprolol-hydrochlorothiazide (ZIAC) 10-6.25 MG tablet TAKE 1  TABLET BY MOUTH EACH DAY FOR BLOOD PRESSURE 90 tablet 1  . Cholecalciferol (VITAMIN D3) 5000 UNITS CAPS Take 5,000 Units by mouth daily.    Marland Kitchen losartan (COZAAR) 100 MG tablet Take 1 tablet (100 mg total) by mouth daily. 90 tablet 1  . omeprazole (PRILOSEC) 40 MG capsule TAKE 1 CAPSULE BY MOUTH 2 TIMES A DAY. 180 capsule 3  . vitamin B-12 (CYANOCOBALAMIN) 50 MCG tablet Take 50 mcg by mouth daily.     No current facility-administered medications on file prior to visit.    Medical History:  Past Medical History  Diagnosis Date  . HTN (hypertension)   . Hyperlipemia     pt denies 08/08/12  . GERD (gastroesophageal reflux disease)   . Obesity   . History of Helicobacter pylori infection   . Vitamin D deficiency   . Hemorrhoids   . Peripheral vascular disease ([place]) 9/14    DVT  left lower leg    Allergies:  Allergies  Allergen Reactions  . Ace Inhibitors Other (See Comments)    Cough   . Citalopram Nausea And Vomiting     Review of Systems:  Review of Systems  Constitutional: Negative for fever, chills and malaise/fatigue.  HENT: Negative for congestion, ear pain and sore throat.   Eyes: Negative.   Respiratory: Negative for cough, shortness of breath and wheezing.   Cardiovascular: Negative for chest pain, palpitations and leg swelling.  Gastrointestinal: Negative for heartburn, abdominal pain, diarrhea, constipation, blood in stool and melena.  Genitourinary: Negative.   Musculoskeletal: Positive for joint pain (right knee pain).  Skin: Negative.   Neurological: Negative for dizziness, sensory change, loss of consciousness and headaches.  Psychiatric/Behavioral: Negative for depression. The patient is not nervous/anxious and does not have insomnia.     Family history- Review and unchanged  Social history- Review and unchanged  Physical Exam: BP 132/74 mmHg  Pulse 72  Temp(Src) 97.7 F (36.5 C) (Temporal)  Resp 14  Ht 5\' 11"  (1.803 m)  Wt 277 lb 12.8 oz (126.009 kg)   BMI 38.76 kg/m2  SpO2 97% Wt Readings from Last 3 Encounters:  09/08/15 277 lb 12.8 oz (126.009 kg)  06/01/15 273 lb 9.6 oz (124.104 kg)  03/02/15 274 lb (124.286 kg)    General Appearance: Well nourished well developed, in no apparent distress. Eyes: PERRLA, EOMs, conjunctiva no swelling or erythema ENT/Mouth: Ear canals normal without obstruction, swelling, erythma, discharge.  TMs normal bilaterally.  Oropharynx moist, clear, without exudate, or postoropharyngeal swelling. Neck: Supple, thyroid normal,no cervical adenopathy  Respiratory: Respiratory effort normal, Breath sounds clear A&P without rhonchi, wheeze, or rale.  No retractions, no accessory usage. Cardio: RRR with no MRGs. Brisk peripheral pulses without edema.  Abdomen: Soft, + BS,  Non tender, no guarding, rebound, hernias, masses. Musculoskeletal: Full ROM, 5/5 strength, Normal gait Skin: Warm, dry without rashes, lesions, ecchymosis.  Neuro: Awake and oriented X 3, Cranial nerves intact. Normal muscle tone, no cerebellar symptoms. Psych: Normal affect, Insight and Judgment appropriate.    Vicie Mutters, PA-C 3:45 PM [facility]

## 2015-09-09 LAB — LIPID PANEL
Cholesterol: 158 mg/dL (ref 125–200)
HDL: 42 mg/dL (ref >=40)
LDL Cholesterol: 81 mg/dL (ref <130)
Total CHOL/HDL Ratio: 3.8 Ratio (ref <=5.0)
Triglycerides: 175 mg/dL — ABNORMAL HIGH (ref <150)
VLDL: 35 mg/dL — AB (ref <30)

## 2015-09-09 LAB — HEPATIC FUNCTION PANEL
ALBUMIN: 4.1 g/dL (ref 3.6–5.1)
ALT: 25 U/L (ref 9–46)
AST: 21 U/L (ref 10–35)
Alkaline Phosphatase: 70 U/L (ref 40–115)
BILIRUBIN TOTAL: 0.5 mg/dL (ref 0.2–1.2)
Bilirubin, Direct: 0.1 mg/dL (ref <=0.2)
Indirect Bilirubin: 0.4 mg/dL (ref 0.2–1.2)
TOTAL PROTEIN: 6.6 g/dL (ref 6.1–8.1)

## 2015-09-09 LAB — BASIC METABOLIC PANEL WITH GFR
BUN: 15 mg/dL (ref 7–25)
CO2: 28 mmol/L (ref 20–31)
CREATININE: 1.07 mg/dL (ref 0.70–1.25)
Calcium: 9.5 mg/dL (ref 8.6–10.3)
Chloride: 101 mmol/L (ref 98–110)
GFR, Est African American: 83 mL/min (ref >=60)
GFR, Est Non African American: 72 mL/min (ref >=60)
Glucose, Bld: 94 mg/dL (ref 65–99)
Potassium: 4.3 mmol/L (ref 3.5–5.3)
Sodium: 140 mmol/L (ref 135–146)

## 2015-09-09 LAB — VITAMIN D 25 HYDROXY (VIT D DEFICIENCY, FRACTURES): Vit D, 25-Hydroxy: 62 ng/mL (ref 30–100)

## 2015-09-09 LAB — HEMOGLOBIN A1C
Hgb A1c MFr Bld: 6.2 % — ABNORMAL HIGH (ref <5.7)
Mean Plasma Glucose: 131 mg/dL

## 2015-09-09 LAB — TSH: TSH: 1.22 mIU/L (ref 0.40–4.50)

## 2015-09-09 LAB — MAGNESIUM: Magnesium: 1.9 mg/dL (ref 1.5–2.5)

## 2015-12-27 ENCOUNTER — Encounter: Payer: Self-pay | Admitting: Internal Medicine

## 2016-01-17 ENCOUNTER — Encounter: Payer: Self-pay | Admitting: Internal Medicine

## 2016-01-17 ENCOUNTER — Ambulatory Visit (INDEPENDENT_AMBULATORY_CARE_PROVIDER_SITE_OTHER): Payer: Managed Care, Other (non HMO) | Admitting: Internal Medicine

## 2016-01-17 VITALS — BP 132/74 | HR 69 | Temp 97.5°F | Resp 16 | Ht 70.5 in | Wt 278.6 lb

## 2016-01-17 DIAGNOSIS — Z125 Encounter for screening for malignant neoplasm of prostate: Secondary | ICD-10-CM

## 2016-01-17 DIAGNOSIS — Z79899 Other long term (current) drug therapy: Secondary | ICD-10-CM

## 2016-01-17 DIAGNOSIS — E782 Mixed hyperlipidemia: Secondary | ICD-10-CM

## 2016-01-17 DIAGNOSIS — Z0000 Encounter for general adult medical examination without abnormal findings: Secondary | ICD-10-CM | POA: Diagnosis not present

## 2016-01-17 DIAGNOSIS — Z23 Encounter for immunization: Secondary | ICD-10-CM | POA: Diagnosis not present

## 2016-01-17 DIAGNOSIS — Z136 Encounter for screening for cardiovascular disorders: Secondary | ICD-10-CM | POA: Diagnosis not present

## 2016-01-17 DIAGNOSIS — R7303 Prediabetes: Secondary | ICD-10-CM

## 2016-01-17 DIAGNOSIS — Z0001 Encounter for general adult medical examination with abnormal findings: Secondary | ICD-10-CM

## 2016-01-17 DIAGNOSIS — Z1212 Encounter for screening for malignant neoplasm of rectum: Secondary | ICD-10-CM

## 2016-01-17 DIAGNOSIS — K219 Gastro-esophageal reflux disease without esophagitis: Secondary | ICD-10-CM

## 2016-01-17 DIAGNOSIS — R5383 Other fatigue: Secondary | ICD-10-CM

## 2016-01-17 DIAGNOSIS — I10 Essential (primary) hypertension: Secondary | ICD-10-CM | POA: Diagnosis not present

## 2016-01-17 DIAGNOSIS — E559 Vitamin D deficiency, unspecified: Secondary | ICD-10-CM

## 2016-01-17 LAB — VITAMIN B12: Vitamin B-12: 845 pg/mL (ref 200–1100)

## 2016-01-17 LAB — MAGNESIUM: Magnesium: 2 mg/dL (ref 1.5–2.5)

## 2016-01-17 LAB — IRON AND TIBC
%SAT: 20 % (ref 15–60)
Iron: 68 ug/dL (ref 50–180)
TIBC: 346 ug/dL (ref 250–425)
UIBC: 278 ug/dL (ref 125–400)

## 2016-01-17 LAB — HEPATIC FUNCTION PANEL
ALBUMIN: 4.1 g/dL (ref 3.6–5.1)
ALK PHOS: 76 U/L (ref 40–115)
ALT: 32 U/L (ref 9–46)
AST: 26 U/L (ref 10–35)
Bilirubin, Direct: 0.1 mg/dL (ref <=0.2)
Indirect Bilirubin: 0.3 mg/dL (ref 0.2–1.2)
TOTAL PROTEIN: 6.8 g/dL (ref 6.1–8.1)
Total Bilirubin: 0.4 mg/dL (ref 0.2–1.2)

## 2016-01-17 LAB — CBC WITH DIFFERENTIAL/PLATELET
Basophils Absolute: 0 cells/uL (ref 0–200)
Basophils Relative: 0 %
EOS ABS: 324 {cells}/uL (ref 15–500)
Eosinophils Relative: 4 %
HEMATOCRIT: 47.6 % (ref 38.5–50.0)
Hemoglobin: 16.1 g/dL (ref 13.2–17.1)
LYMPHS PCT: 27 %
Lymphs Abs: 2187 cells/uL (ref 850–3900)
MCH: 30.8 pg (ref 27.0–33.0)
MCHC: 33.8 g/dL (ref 32.0–36.0)
MCV: 91.2 fL (ref 80.0–100.0)
MONO ABS: 648 {cells}/uL (ref 200–950)
MPV: 9.8 fL (ref 7.5–12.5)
Monocytes Relative: 8 %
NEUTROS PCT: 61 %
Neutro Abs: 4941 cells/uL (ref 1500–7800)
Platelets: 239 10*3/uL (ref 140–400)
RBC: 5.22 MIL/uL (ref 4.20–5.80)
RDW: 13.6 % (ref 11.0–15.0)
WBC: 8.1 10*3/uL (ref 3.8–10.8)

## 2016-01-17 LAB — LIPID PANEL
Cholesterol: 156 mg/dL (ref <200)
HDL: 43 mg/dL (ref >40)
LDL CALC: 85 mg/dL (ref <100)
TRIGLYCERIDES: 141 mg/dL (ref <150)
Total CHOL/HDL Ratio: 3.6 Ratio (ref <5.0)
VLDL: 28 mg/dL (ref <30)

## 2016-01-17 LAB — BASIC METABOLIC PANEL WITH GFR
BUN: 15 mg/dL (ref 7–25)
CALCIUM: 9.6 mg/dL (ref 8.6–10.3)
CO2: 24 mmol/L (ref 20–31)
Chloride: 99 mmol/L (ref 98–110)
Creat: 1.07 mg/dL (ref 0.70–1.25)
GFR, EST AFRICAN AMERICAN: 83 mL/min (ref >=60)
GFR, Est Non African American: 72 mL/min (ref >=60)
GLUCOSE: 124 mg/dL — AB (ref 65–99)
Potassium: 4 mmol/L (ref 3.5–5.3)
Sodium: 140 mmol/L (ref 135–146)

## 2016-01-17 LAB — HEMOGLOBIN A1C
HEMOGLOBIN A1C: 5.9 % — AB (ref <5.7)
MEAN PLASMA GLUCOSE: 123 mg/dL

## 2016-01-17 LAB — PSA: PSA: 5.3 ng/mL — ABNORMAL HIGH (ref <=4.0)

## 2016-01-17 LAB — TSH: TSH: 1 mIU/L (ref 0.40–4.50)

## 2016-01-17 NOTE — Progress Notes (Signed)
[facility]   Unk Pinto, M.D.    Jason Patton. Silverio Lay, P.A.-C      Starlyn Skeans, P.A.-C  [facility]                [address] [place], [place] [id number] Telephone [phone] Telefax [phone] Annual  Screening/Preventative Visit  & Comprehensive Evaluation & Examination     This very nice 65 y.o. mMWM presents for a Screening/Preventative Visit & comprehensive evaluation and management of multiple medical co-morbidities.  Patient has been followed for HTN, Prediabetes, Hyperlipidemia and Vitamin D Deficiency. Patient also has hx/o GERD controlled with his Prilosec. Patient also relate s recently passing 2 large kidney stones.      HTN predates since 2009. Patient's BP has been controlled at home. Today's BP is 132/74. Patient denies any cardiac symptoms as chest pain, palpitations, shortness of breath, dizziness or ankle swelling.     Patient's hyperlipidemia is controlled with diet and medications. Patient denies myalgias or other medication SE's. Last lipids were at goal albeit slightly elevated Trig's:  Lab Results  Component Value Date   CHOL 158 09/08/2015   HDL 42 09/08/2015   LDLCALC 81 09/08/2015   TRIG 175 (H) 09/08/2015   CHOLHDL 3.8 09/08/2015      Patient has  Morbid Obesity (BMI 39+) and consequent prediabetes circa 2009 and patient denies reactive hypoglycemic symptoms, visual blurring, diabetic polys or paresthesias. Last A1c was not at goal: Lab Results  Component Value Date   HGBA1C 6.2 (H) 09/08/2015       Finally, patient has history of Vitamin D Deficiency in 2009  of "32" and last vitamin D was at goal: Lab Results  Component Value Date   VD25OH 62 09/08/2015   Current Outpatient Prescriptions on File Prior to Visit  Medication Sig  . aspirin 81 MG tablet Take 162 mg by mouth daily.   . bisoprolol-hydrochlorothiazide (ZIAC) 10-6.25 MG tablet TAKE 1 TABLET BY  MOUTH EACH DAY FOR BLOOD PRESSURE  . Cholecalciferol (VITAMIN D3) 5000 UNITS CAPS Take 5,000 Units by mouth daily.  Marland Kitchen losartan (COZAAR) 100 MG tablet Take 1 tablet (100 mg total) by mouth daily.  Marland Kitchen omeprazole (PRILOSEC) 40 MG capsule TAKE 1 CAPSULE BY MOUTH 2 TIMES A DAY.  . vitamin B-12 (CYANOCOBALAMIN) 50 MCG tablet Take 50 mcg by mouth daily.   No current facility-administered medications on file prior to visit.    Allergies  Allergen Reactions  . Ace Inhibitors Other (See Comments)    Cough   . Citalopram Nausea And Vomiting   Past Medical History:  Diagnosis Date  . GERD (gastroesophageal reflux disease)   . Hemorrhoids   . History of Helicobacter pylori infection   . HTN (hypertension)   . Hyperlipemia    pt denies 08/08/12  . Obesity   . Peripheral vascular disease ([place]) 9/14   DVT  left lower leg  . Vitamin D deficiency    Health Maintenance  Topic Date Due  . Hepatitis C Screening  31-Dec-1950  . FOOT EXAM  03/18/1959  . OPHTHALMOLOGY EXAM  03/18/1959  . TETANUS/TDAP  03/17/1968  . COLONOSCOPY  03/18/1999  . ZOSTAVAX  03/17/2009  . PNA vac Low Risk Adult (2 of 2 - PPSV23) 03/17/2014  . INFLUENZA VACCINE  10/04/2015  . HEMOGLOBIN A1C  03/10/2016   Immunization History  Administered Date(s) Administered  .  Influenza Split 12/23/2013  . Influenza, High Dose Seasonal PF 11/25/2014  . Influenza, Seasonal, Injecte, Preservative Fre 01/17/2016  . Pneumococcal Conjugate-13 12/01/2008   Past Surgical History:  Procedure Laterality Date  . CYSTOSCOPY/RETROGRADE/URETEROSCOPY Left 01/02/2013   Procedure: CYSTOSCOPY/LEFT URETERAL WASHINGS/LEFT RETROGRADE PYELOGRAM/ LEFT URETEROSCOPY/URETERAL BIOPSY. BLADDER BIOPSY;  Surgeon: Ardis Hughs, MD;  Location: [facility];  Service: Urology;  Laterality: Left;  With STENT  . HEMORRHOID SURGERY     age 28  . TONSILLECTOMY     Family History  Problem Relation Age of Onset  . Diabetes Father   . Heart disease Father   .  Cancer Mother     Bladder   Social History   Social History  . Marital status: Married    Spouse name: N/A  . Number of children: 1  . Years of education: N/A   Occupational History  . sales [employer]   Social History Main Topics  . Smoking status: Never Smoker  . Smokeless tobacco: Never Used  . Alcohol use No  . Drug use: No  . Sexual activity: Not on file   Other Topics Concern  . Not on file   Social History Narrative  . No narrative on file    ROS Constitutional: Denies fever, chills, weight loss/gain, headaches, insomnia,  night sweats or change in appetite. Does c/o fatigue. Eyes: Denies redness, blurred vision, diplopia, discharge, itchy or watery eyes.  ENT: Denies discharge, congestion, post nasal drip, epistaxis, sore throat, earache, hearing loss, dental pain, Tinnitus, Vertigo, Sinus pain or snoring.  Cardio: Denies chest pain, palpitations, irregular heartbeat, syncope, dyspnea, diaphoresis, orthopnea, PND, claudication or edema Respiratory: denies cough, dyspnea, DOE, pleurisy, hoarseness, laryngitis or wheezing.  Gastrointestinal: Denies dysphagia, heartburn, reflux, water brash, pain, cramps, nausea, vomiting, bloating, diarrhea, constipation, hematemesis, melena, hematochezia, jaundice or hemorrhoids Genitourinary: Denies dysuria, frequency, urgency, nocturia, hesitancy, discharge, hematuria or flank pain Musculoskeletal: Denies arthralgia, myalgia, stiffness, Jt. Swelling, pain, limp or strain/sprain. Denies Falls. Skin: Denies puritis, rash, hives, warts, acne, eczema or change in skin lesion Neuro: No weakness, tremor, incoordination, spasms, paresthesia or pain Psychiatric: Denies confusion, memory loss or sensory loss. Denies Depression. Endocrine: Denies change in weight, skin, hair change, nocturia, and paresthesia, diabetic polys, visual blurring or hyper / hypo glycemic episodes.  Heme/Lymph: No excessive bleeding, bruising or  enlarged lymph nodes.  Physical Exam  BP 132/74   Pulse 69   Temp 97.5 F (36.4 C)   Resp 16   Ht 5' 10.5" (1.791 m)   Wt 278 lb 9.6 oz (126.4 kg)   SpO2 99%   BMI 39.41 kg/m   General Appearance: Well nourished, in no apparent distress.  Eyes: PERRLA, EOMs, conjunctiva no swelling or erythema, normal fundi and vessels. Sinuses: No frontal/maxillary tenderness ENT/Mouth: EACs patent / TMs  nl. Nares clear without erythema, swelling, mucoid exudates. Oral hygiene is good. No erythema, swelling, or exudate. Tongue normal, non-obstructing. Tonsils not swollen or erythematous. Hearing normal.  Neck: Supple, thyroid normal. No bruits, nodes or JVD. Respiratory: Respiratory effort normal.  BS equal and clear bilateral without rales, rhonci, wheezing or stridor. Cardio: Heart sounds are normal with regular rate and rhythm and no murmurs, rubs or gallops. Peripheral pulses are normal and equal bilaterally without edema. No aortic or femoral bruits. Chest: symmetric with normal excursions and percussion.  Abdomen: Soft, with Nl bowel sounds. Nontender, no guarding, rebound, hernias, masses, or organomegaly.  Lymphatics: Non tender without lymphadenopathy.  Genitourinary: No hernias.Testes  nl. DRE - prostate nl for age - smooth & firm w/o nodules. Musculoskeletal: Full ROM all peripheral extremities, joint stability, 5/5 strength, and normal gait. Skin: Warm and dry without rashes, lesions, cyanosis, clubbing or  ecchymosis.  Neuro: Cranial nerves intact, reflexes equal bilaterally. Normal muscle tone, no cerebellar symptoms. Sensation intact.  Pysch: Alert and oriented X 3 with normal affect, insight and judgment appropriate.   Assessment and Plan  1. Annual Preventative/Screening Exam   - Microalbumin / creatinine urine ratio - EKG 12-Lead - [place], RETROPERITNL ABD,  LTD - POC Hemoccult Bld/Stl  - Urinalysis, Routine w reflex microscopic  - Vitamin B12 - Iron and TIBC -  Testosterone - PSA - CBC with Differential/Platelet - BASIC METABOLIC PANEL WITH GFR - Hepatic function panel - Magnesium - Lipid panel - TSH - Hemoglobin A1c - Insulin, random - VITAMIN D 25 Hydroxy   2. Essential hypertension  - Microalbumin / creatinine urine ratio - EKG 12-Lead - [place], RETROPERITNL ABD,  LTD - TSH  3. Mixed hyperlipidemia  - Lipid panel - TSH  4. Prediabetes  - Hemoglobin A1c - Insulin, random  5. Vitamin D deficiency  - VITAMIN D 25 Hydroxy   6. Gastroesophageal reflux disease  - patient was encouraged to try tapering his PPI to Qod and to supplement if needed with OTC Ranitidine 150 mg or call for Rx 300 mg.   7. Prostate cancer screening  - PSA  8. Screening for rectal cancer  - POC Hemoccult Bld/Stl   9. Screening for ischemic heart disease   10. Screening for AAA (aortic abdominal aneurysm)   11. Flu vaccine need  - Flu vaccine 6-70mo preservative free IM  12. Other fatigue  - Vitamin B12 - Iron and TIBC - Testosterone - CBC with Differential/Platelet - TSH  13. Medication management  - Urinalysis, Routine w reflex microscopic  - CBC with Differential/Platelet - BASIC METABOLIC PANEL WITH GFR - Hepatic function panel - Magnesium       Continue prudent diet as discussed, weight control, BP monitoring, regular exercise, and medications as discussed.  Discussed med effects and SE's. Routine screening labs and tests as requested with regular follow-up as recommended. Over 40 minutes of exam, counseling, chart review and high complex critical decision making was performed

## 2016-01-17 NOTE — Patient Instructions (Signed)

## 2016-01-18 LAB — URINALYSIS, ROUTINE W REFLEX MICROSCOPIC
BILIRUBIN URINE: NEGATIVE
GLUCOSE, UA: NEGATIVE
HGB URINE DIPSTICK: NEGATIVE
KETONES UR: NEGATIVE
LEUKOCYTES UA: NEGATIVE
Nitrite: NEGATIVE
PROTEIN: NEGATIVE
Specific Gravity, Urine: 1.025 (ref 1.001–1.035)
pH: 5.5 (ref 5.0–8.0)

## 2016-01-18 LAB — INSULIN, RANDOM: INSULIN: 103.9 u[IU]/mL — AB (ref 2.0–19.6)

## 2016-01-18 LAB — MICROALBUMIN / CREATININE URINE RATIO
Creatinine, Urine: 246 mg/dL (ref 20–370)
MICROALB UR: 1.2 mg/dL
MICROALB/CREAT RATIO: 5 ug/mg{creat} (ref <30)

## 2016-01-18 LAB — TESTOSTERONE: Testosterone: 309 ng/dL (ref 250–827)

## 2016-01-18 LAB — VITAMIN D 25 HYDROXY (VIT D DEFICIENCY, FRACTURES): Vit D, 25-Hydroxy: 76 ng/mL (ref 30–100)

## 2016-03-26 ENCOUNTER — Other Ambulatory Visit: Payer: Self-pay | Admitting: Internal Medicine

## 2016-05-01 ENCOUNTER — Encounter: Payer: Self-pay | Admitting: Internal Medicine

## 2016-05-01 ENCOUNTER — Ambulatory Visit (INDEPENDENT_AMBULATORY_CARE_PROVIDER_SITE_OTHER): Payer: 59 | Admitting: Internal Medicine

## 2016-05-01 VITALS — BP 136/82 | HR 80 | Temp 98.2°F | Resp 16 | Ht 70.5 in | Wt 282.0 lb

## 2016-05-01 DIAGNOSIS — I10 Essential (primary) hypertension: Secondary | ICD-10-CM | POA: Diagnosis not present

## 2016-05-01 DIAGNOSIS — Z79899 Other long term (current) drug therapy: Secondary | ICD-10-CM | POA: Diagnosis not present

## 2016-05-01 DIAGNOSIS — E559 Vitamin D deficiency, unspecified: Secondary | ICD-10-CM

## 2016-05-01 DIAGNOSIS — R7303 Prediabetes: Secondary | ICD-10-CM

## 2016-05-01 DIAGNOSIS — C61 Malignant neoplasm of prostate: Secondary | ICD-10-CM

## 2016-05-01 DIAGNOSIS — E782 Mixed hyperlipidemia: Secondary | ICD-10-CM

## 2016-05-01 LAB — CBC WITH DIFFERENTIAL/PLATELET
Basophils Absolute: 0 cells/uL (ref 0–200)
Basophils Relative: 0 %
EOS PCT: 4 %
Eosinophils Absolute: 460 cells/uL (ref 15–500)
HCT: 47.6 % (ref 38.5–50.0)
HEMOGLOBIN: 16.3 g/dL (ref 13.2–17.1)
LYMPHS ABS: 3220 {cells}/uL (ref 850–3900)
Lymphocytes Relative: 28 %
MCH: 30.5 pg (ref 27.0–33.0)
MCHC: 34.2 g/dL (ref 32.0–36.0)
MCV: 89 fL (ref 80.0–100.0)
MONOS PCT: 10 %
MPV: 10.1 fL (ref 7.5–12.5)
Monocytes Absolute: 1150 cells/uL — ABNORMAL HIGH (ref 200–950)
NEUTROS ABS: 6670 {cells}/uL (ref 1500–7800)
NEUTROS PCT: 58 %
PLATELETS: 244 10*3/uL (ref 140–400)
RBC: 5.35 MIL/uL (ref 4.20–5.80)
RDW: 13.8 % (ref 11.0–15.0)
WBC: 11.5 10*3/uL — AB (ref 3.8–10.8)

## 2016-05-01 NOTE — Progress Notes (Signed)
Assessment and Plan:  Hypertension:  -Continue medication,  -monitor blood pressure at home.  -Continue DASH diet.   -Reminder to go to the ER if any CP, SOB, nausea, dizziness, severe HA, changes vision/speech, left arm numbness and tingling, and jaw pain.  Cholesterol: -Continue diet and exercise.   Pre-diabetes: -Continue diet and exercise.   Vitamin D Def: -continue medications.   Prostate Cancer -recommended he start a list of questions and determine which treatment he will undergo.   -being followed by urology  Continue diet and meds as discussed. Further disposition pending results of labs.  HPI 66 y.o. male  presents for 3 month follow up with hypertension, hyperlipidemia, prediabetes and vitamin D.   His blood pressure has been controlled at home, today their BP is BP: 136/82.   He does not workout. He denies chest pain, shortness of breath, dizziness.   He is on cholesterol medication and denies myalgias. His cholesterol is at goal. The cholesterol last visit was:   Lab Results  Component Value Date   CHOL 156 01/17/2016   HDL 43 01/17/2016   LDLCALC 85 01/17/2016   TRIG 141 01/17/2016   CHOLHDL 3.6 01/17/2016     He has been working on diet and exercise for prediabetes, and denies foot ulcerations, hyperglycemia, hypoglycemia , increased appetite, nausea, paresthesia of the feet, polydipsia, polyuria, visual disturbances, vomiting and weight loss. Last A1C in the office was:  Lab Results  Component Value Date   HGBA1C 5.9 (H) 01/17/2016    Patient is on Vitamin D supplement.  Lab Results  Component Value Date   VD25OH 90 01/17/2016     He has recently been diagnosed with prostate cancer.  He reports that he has been having a hard time with the diagnosis.  He is trying to digest it and make decisions about what type of treatment he is going to have.    Current Medications:  Current Outpatient Prescriptions on File Prior to Visit  Medication Sig Dispense  Refill  . aspirin 81 MG tablet Take 162 mg by mouth daily.     . bisoprolol-hydrochlorothiazide (ZIAC) 10-6.25 MG tablet TAKE 1 TABLET BY MOUTH EACH DAY FOR BLOOD PRESSURE 90 tablet 1  . bisoprolol-hydrochlorothiazide (ZIAC) 10-6.25 MG tablet TAKE 1 TABLET BY MOUTH EACH DAY FOR BLOOD PRESSURE 90 tablet 1  . Cholecalciferol (VITAMIN D3) 5000 UNITS CAPS Take 5,000 Units by mouth daily.    Marland Kitchen losartan (COZAAR) 100 MG tablet Take 1 tablet (100 mg total) by mouth daily. 90 tablet 1  . omeprazole (PRILOSEC) 40 MG capsule TAKE 1 CAPSULE BY MOUTH 2 TIMES A DAY. 180 capsule 3  . vitamin B-12 (CYANOCOBALAMIN) 50 MCG tablet Take 50 mcg by mouth daily.     No current facility-administered medications on file prior to visit.     Medical History:  Past Medical History:  Diagnosis Date  . GERD (gastroesophageal reflux disease)   . Hemorrhoids   . History of Helicobacter pylori infection   . HTN (hypertension)   . Hyperlipemia    pt denies 08/08/12  . Obesity   . Peripheral vascular disease ([place]) 9/14   DVT  left lower leg  . Vitamin D deficiency     Allergies:  Allergies  Allergen Reactions  . Ace Inhibitors Other (See Comments)    Cough   . Citalopram Nausea And Vomiting     Review of Systems:  Review of Systems  Constitutional: Negative for chills, fever and malaise/fatigue.  HENT: Negative  for congestion, ear pain and sore throat.   Eyes: Negative.   Respiratory: Negative for cough, shortness of breath and wheezing.   Cardiovascular: Negative for chest pain, palpitations and leg swelling.  Gastrointestinal: Negative for abdominal pain, blood in stool, constipation, diarrhea, heartburn and melena.  Genitourinary: Negative.   Skin: Negative.   Neurological: Negative for dizziness, sensory change, loss of consciousness and headaches.  Psychiatric/Behavioral: Positive for depression. The patient is nervous/anxious. The patient does not have insomnia.     Family history- Review and  unchanged  Social history- Review and unchanged  Physical Exam: BP 136/82   Pulse 80   Temp 98.2 F (36.8 C) (Temporal)   Resp 16   Ht 5' 10.5" (1.791 m)   Wt 282 lb (127.9 kg)   BMI 39.89 kg/m  Wt Readings from Last 3 Encounters:  05/01/16 282 lb (127.9 kg)  01/17/16 278 lb 9.6 oz (126.4 kg)  09/08/15 277 lb 12.8 oz (126 kg)    General Appearance: Well nourished well developed, in no apparent distress. Eyes: PERRLA, EOMs, conjunctiva no swelling or erythema ENT/Mouth: Ear canals normal without obstruction, swelling, erythma, discharge.  TMs normal bilaterally.  Oropharynx moist, clear, without exudate, or postoropharyngeal swelling. Neck: Supple, thyroid normal,no cervical adenopathy  Respiratory: Respiratory effort normal, Breath sounds clear A&P without rhonchi, wheeze, or rale.  No retractions, no accessory usage. Cardio: RRR with no MRGs. Brisk peripheral pulses without edema.  Abdomen: Soft, + BS,  Non tender, no guarding, rebound, hernias, masses. Musculoskeletal: Full ROM, 5/5 strength, Normal gait Skin: Warm, dry without rashes, lesions, ecchymosis.  Neuro: Awake and oriented X 3, Cranial nerves intact. Normal muscle tone, no cerebellar symptoms. Psych: Normal affect, Insight and Judgment appropriate.    Starlyn Skeans, PA-C 4:22 PM [facility]

## 2016-05-02 LAB — TSH: TSH: 1.77 mIU/L (ref 0.40–4.50)

## 2016-05-02 LAB — BASIC METABOLIC PANEL WITH GFR
BUN: 17 mg/dL (ref 7–25)
CO2: 27 mmol/L (ref 20–31)
Calcium: 9.8 mg/dL (ref 8.6–10.3)
Chloride: 100 mmol/L (ref 98–110)
Creat: 1.14 mg/dL (ref 0.70–1.25)
GFR, EST AFRICAN AMERICAN: 77 mL/min (ref >=60)
GFR, EST NON AFRICAN AMERICAN: 66 mL/min (ref >=60)
Glucose, Bld: 93 mg/dL (ref 65–99)
Potassium: 4.5 mmol/L (ref 3.5–5.3)
SODIUM: 139 mmol/L (ref 135–146)

## 2016-05-02 LAB — LIPID PANEL
CHOL/HDL RATIO: 3.9 ratio (ref <5.0)
Cholesterol: 168 mg/dL (ref <200)
HDL: 43 mg/dL (ref >40)
LDL CALC: 101 mg/dL — AB (ref <100)
TRIGLYCERIDES: 119 mg/dL (ref <150)
VLDL: 24 mg/dL (ref <30)

## 2016-05-02 LAB — HEPATIC FUNCTION PANEL
ALT: 37 U/L (ref 9–46)
AST: 31 U/L (ref 10–35)
Albumin: 4.2 g/dL (ref 3.6–5.1)
Alkaline Phosphatase: 70 U/L (ref 40–115)
BILIRUBIN DIRECT: 0.1 mg/dL (ref <=0.2)
BILIRUBIN INDIRECT: 0.3 mg/dL (ref 0.2–1.2)
BILIRUBIN TOTAL: 0.4 mg/dL (ref 0.2–1.2)
Total Protein: 7.1 g/dL (ref 6.1–8.1)

## 2016-05-02 LAB — HEMOGLOBIN A1C
Hgb A1c MFr Bld: 6.1 % — ABNORMAL HIGH (ref <5.7)
Mean Plasma Glucose: 128 mg/dL

## 2016-05-03 DIAGNOSIS — C61 Malignant neoplasm of prostate: Secondary | ICD-10-CM | POA: Insufficient documentation

## 2016-05-21 ENCOUNTER — Encounter: Payer: Self-pay | Admitting: Radiation Oncology

## 2016-05-23 IMAGING — CR DG ABDOMEN 1V
2 series · 2 of 2 positions shown · non-contrast
Comparison: KUB October 08, 2014

CLINICAL DATA: Low left ureteral calculus, currently asymptomatic.

EXAM:
ABDOMEN - 1 VIEW

[t abdomen supine (1 of 2)]
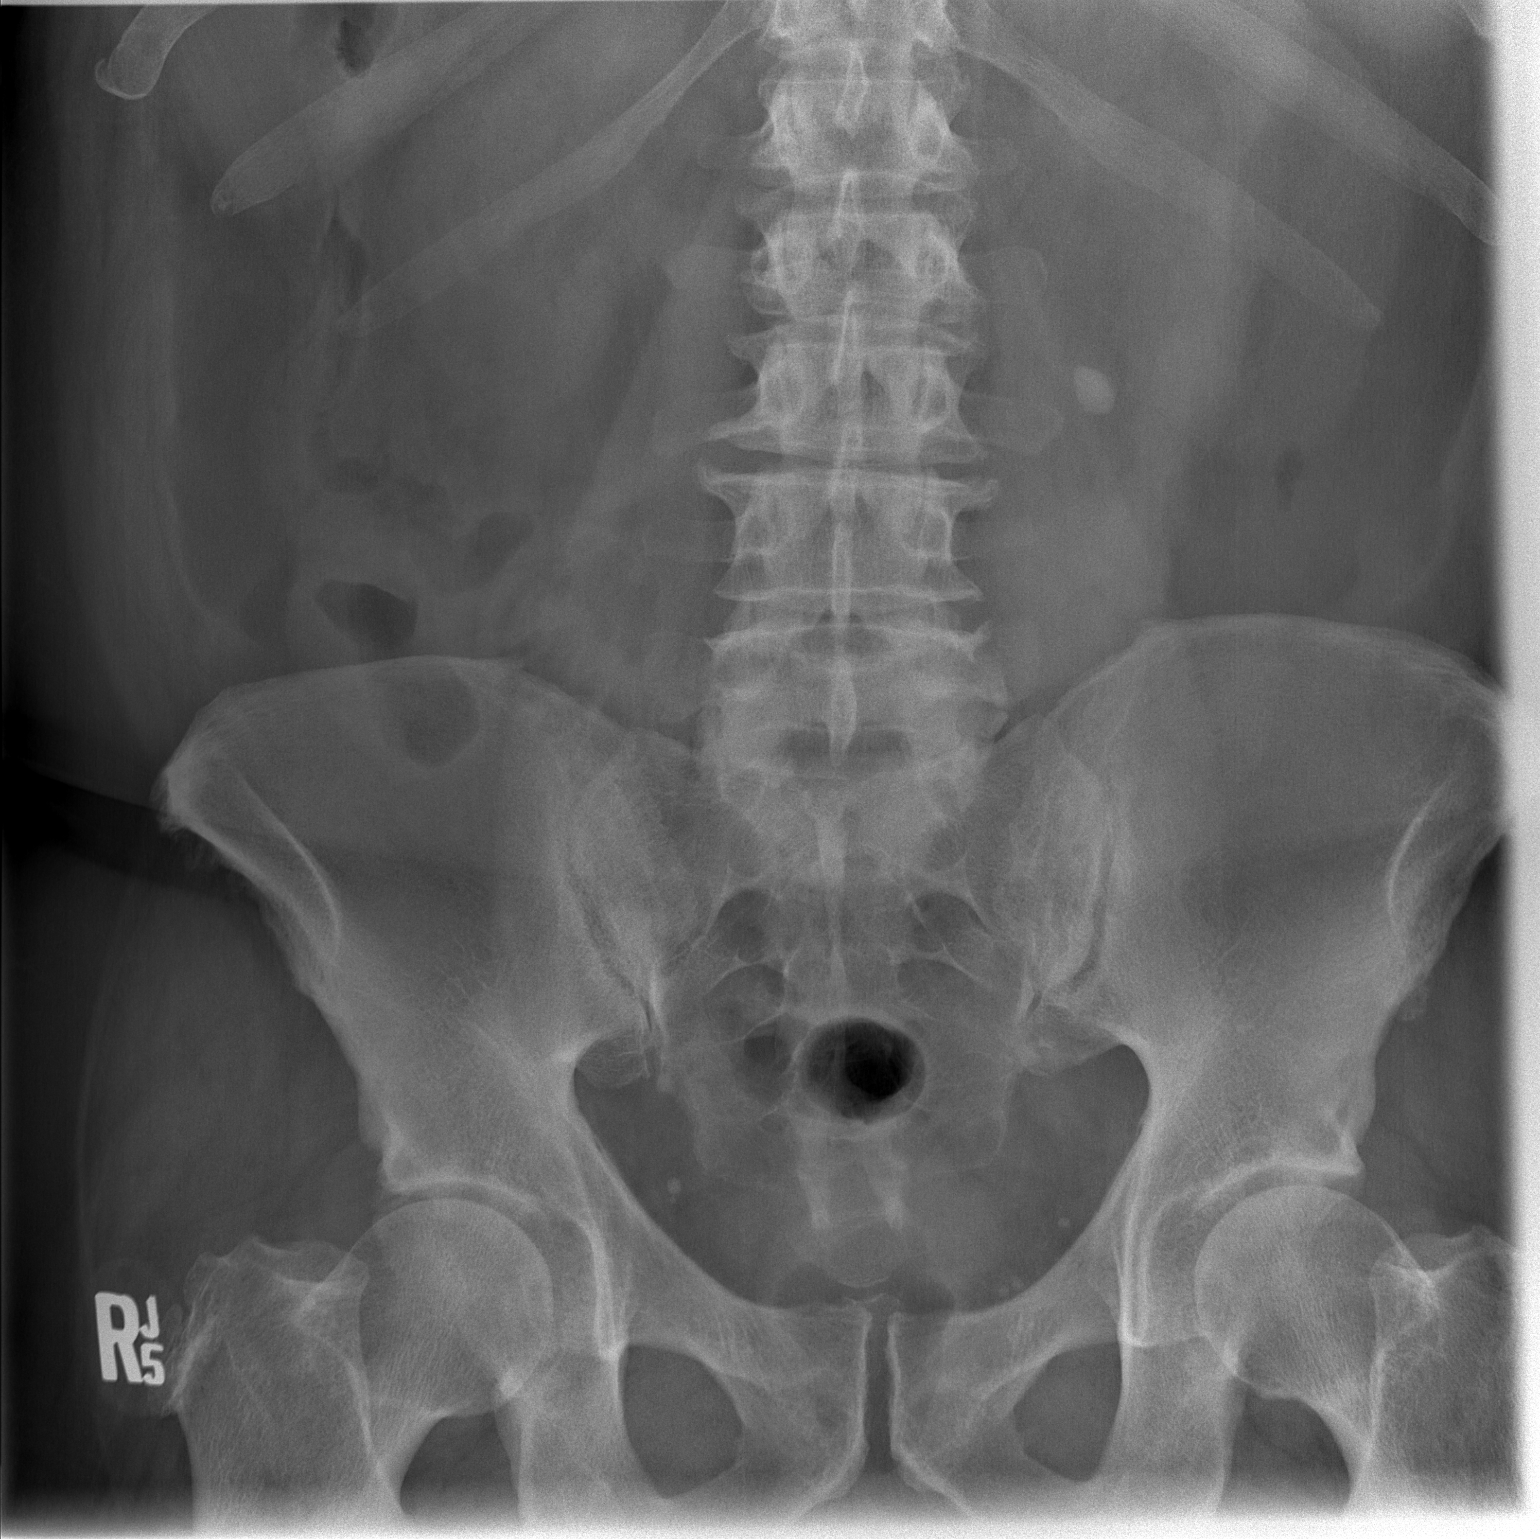

[t abdomen supine (2 of 2)]
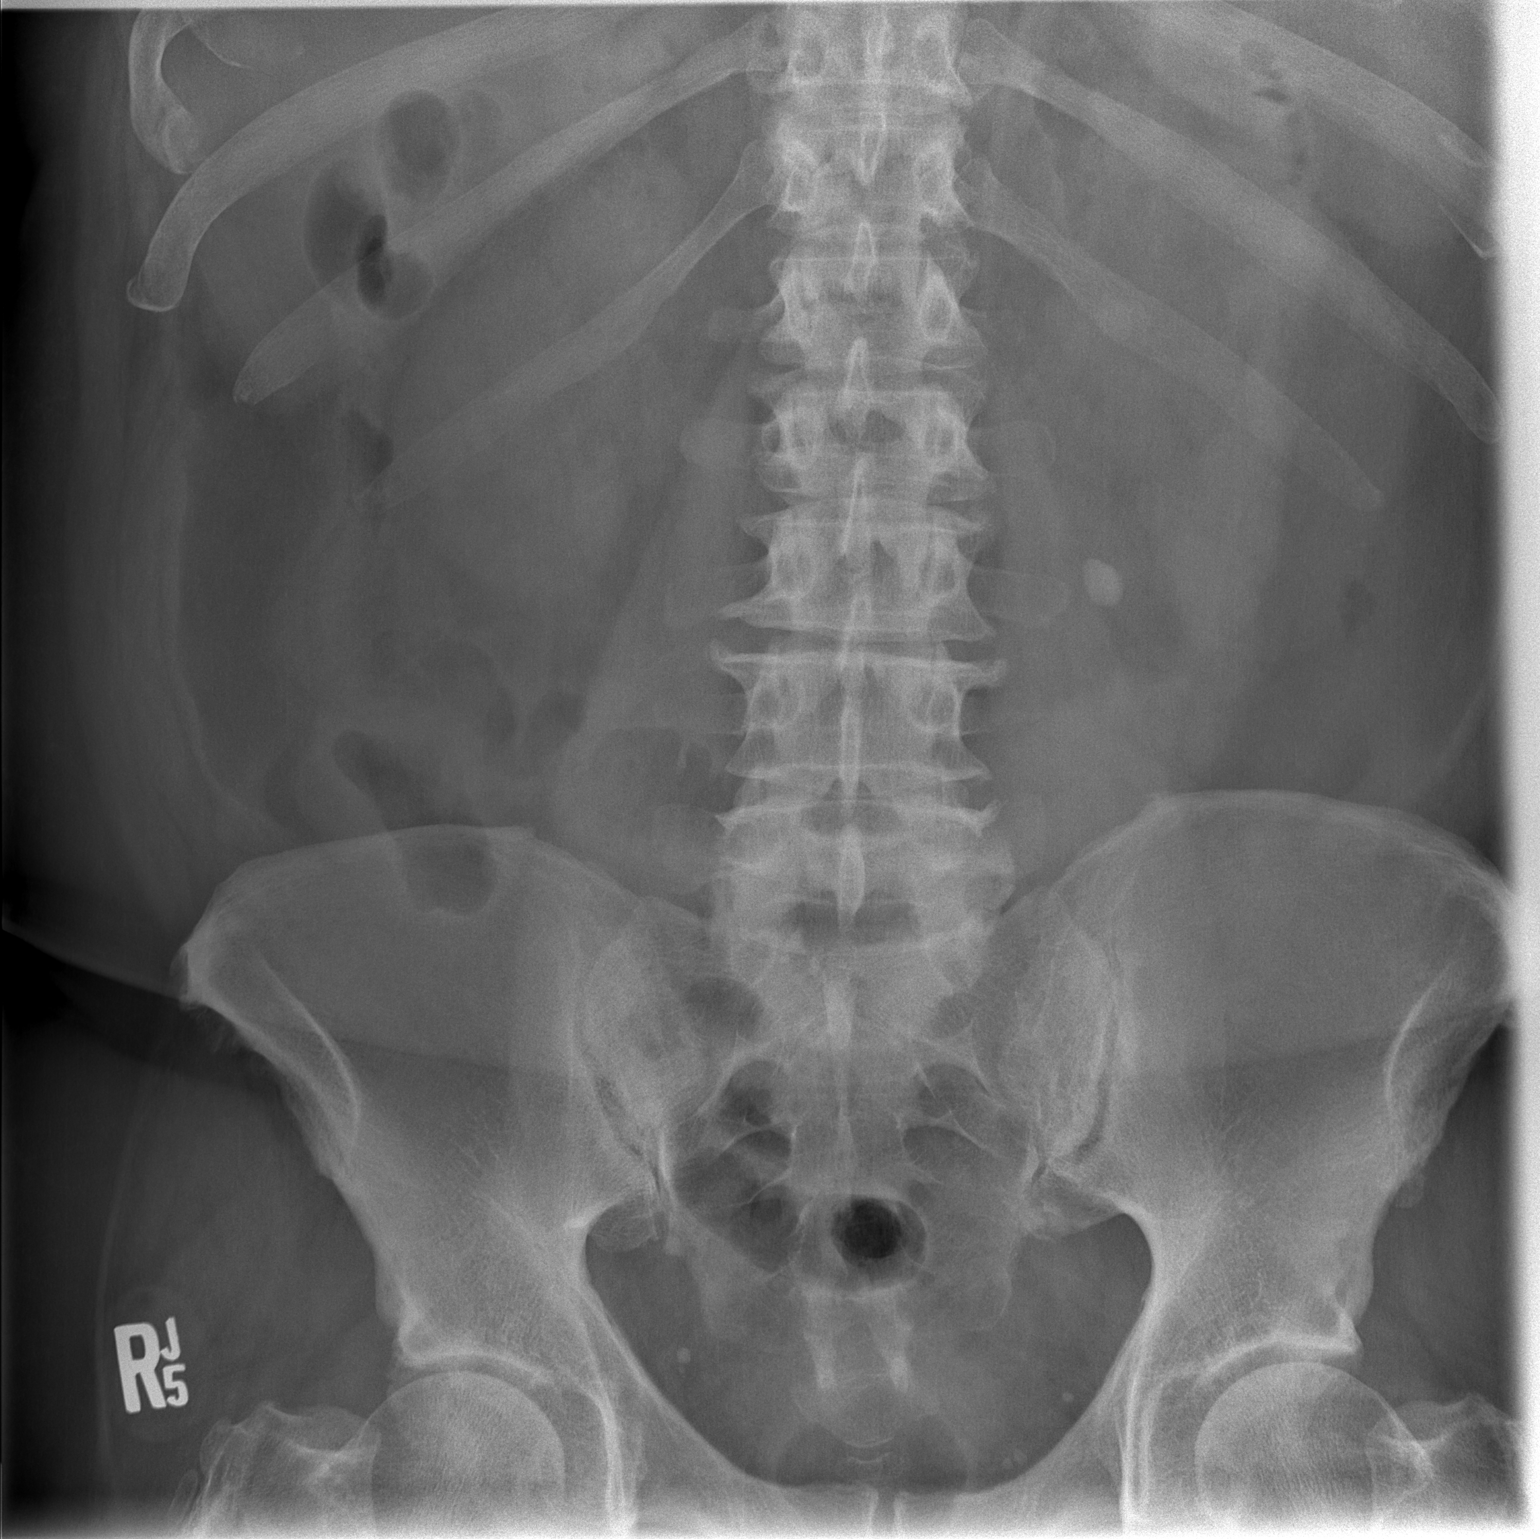

[2 of 2 positions shown; findings below may reference images not displayed]

FINDINGS: Just lateral to the left L3 transverse process there is a stable
cm coarse stone likely at the ureteropelvic junction. No other
stones are demonstrated on the left. No right kidney stones are
observed. There are phleboliths in the pelvis. The bowel gas pattern
and bony structures exhibit no acute abnormality.
IMPRESSION: Stable large left-sided calcification compatible with a
ureteropelvic junction stone.

## 2016-06-07 NOTE — Progress Notes (Addendum)
GU Location of Tumor / Histology: prostatic adenocarcinoma  If Prostate Cancer, Gleason Score is (3 + 4) and PSA is (5.62). 44 grams. Patient explains he and his PCP were aware of the elevated PSA but, opted to manage his kidney stones first. Patient reports "it took two years to get over the kidney stones."  Patient reports he was hospitalized with sepsis due to the kidney stones but, finally passed the last one August of 2017.   2014  PSA  3.4 10/2014  PSA  4.7 01/25/16 PSA  5.3  Biopsies of prostate (if applicable) revealed:    Past/Anticipated interventions by urology, if any: biopsy, referral to Dr. Tammi Klippel to discuss EBRT  Past/Anticipated interventions by medical oncology, if any: no  Weight changes, if any: No  Bowel/Bladder complaints, if any: IPSS 7. Nocturia x 1. Denies dysuria, hematuria, or incontinence.   Nausea/Vomiting, if any: no  Pain issues, if any:  Arthritis in legs  SAFETY ISSUES:  Prior radiation? At the age of 30 months received radiation to shrink his adenoids   Pacemaker/ICD? no  Possible current pregnancy? no  Is the patient on methotrexate? no  Current Complaints / other details:  66 year old. Married.  Mother has a hx of bladder ca.

## 2016-06-11 ENCOUNTER — Ambulatory Visit
Admission: RE | Admit: 2016-06-11 | Discharge: 2016-06-11 | Disposition: A | Discharge status: Home / Self Care | Payer: Managed Care, Other (non HMO) | Source: Ambulatory Visit | Attending: Radiation Oncology | Admitting: Radiation Oncology

## 2016-06-11 ENCOUNTER — Encounter: Payer: Self-pay | Admitting: Radiation Oncology

## 2016-06-11 VITALS — BP 145/94 | HR 64 | Resp 18 | Ht 72.0 in | Wt 284.2 lb

## 2016-06-11 DIAGNOSIS — E559 Vitamin D deficiency, unspecified: Secondary | ICD-10-CM | POA: Insufficient documentation

## 2016-06-11 DIAGNOSIS — Z888 Allergy status to other drugs, medicaments and biological substances status: Secondary | ICD-10-CM | POA: Insufficient documentation

## 2016-06-11 DIAGNOSIS — Z6838 Body mass index (BMI) 38.0-38.9, adult: Secondary | ICD-10-CM | POA: Diagnosis not present

## 2016-06-11 DIAGNOSIS — E785 Hyperlipidemia, unspecified: Secondary | ICD-10-CM | POA: Diagnosis not present

## 2016-06-11 DIAGNOSIS — Z809 Family history of malignant neoplasm, unspecified: Secondary | ICD-10-CM | POA: Diagnosis not present

## 2016-06-11 DIAGNOSIS — Z7982 Long term (current) use of aspirin: Secondary | ICD-10-CM | POA: Diagnosis not present

## 2016-06-11 DIAGNOSIS — Z833 Family history of diabetes mellitus: Secondary | ICD-10-CM | POA: Diagnosis not present

## 2016-06-11 DIAGNOSIS — Z87442 Personal history of urinary calculi: Secondary | ICD-10-CM | POA: Insufficient documentation

## 2016-06-11 DIAGNOSIS — Z8249 Family history of ischemic heart disease and other diseases of the circulatory system: Secondary | ICD-10-CM | POA: Diagnosis not present

## 2016-06-11 DIAGNOSIS — Z8619 Personal history of other infectious and parasitic diseases: Secondary | ICD-10-CM | POA: Insufficient documentation

## 2016-06-11 DIAGNOSIS — Z9889 Other specified postprocedural states: Secondary | ICD-10-CM | POA: Insufficient documentation

## 2016-06-11 DIAGNOSIS — C61 Malignant neoplasm of prostate: Secondary | ICD-10-CM

## 2016-06-11 DIAGNOSIS — K219 Gastro-esophageal reflux disease without esophagitis: Secondary | ICD-10-CM | POA: Insufficient documentation

## 2016-06-11 DIAGNOSIS — I10 Essential (primary) hypertension: Secondary | ICD-10-CM | POA: Insufficient documentation

## 2016-06-11 DIAGNOSIS — E669 Obesity, unspecified: Secondary | ICD-10-CM | POA: Diagnosis not present

## 2016-06-11 DIAGNOSIS — I739 Peripheral vascular disease, unspecified: Secondary | ICD-10-CM | POA: Diagnosis not present

## 2016-06-11 HISTORY — DX: Malignant neoplasm of prostate: C61

## 2016-06-11 NOTE — Progress Notes (Signed)
Radiation Oncology         [phone] ________________________________  Initial outpatient Consultation  Name: Jason Patton MRN: [id number]  Date: 06/11/2016  DOB: 1950/03/25  EX:HBZJIRC,VELFYBO DAVID, MD  Ardis Hughs, MD   REFERRING PHYSICIAN: Ardis Hughs, MD  DIAGNOSIS: 66 y.o. gentleman with stage T1c adenocarcinoma of the prostate with a Gleason's score of 3+4 and a PSA of 5.3    ICD-9-CM ICD-10-CM   1. Prostate cancer ([place]) Patton is a 66 y.o. gentleman.  He was noted to have an elevated PSA of 5.3 by his primary care physician, Dr. Melford Aase.  Accordingly, he was referred for evaluation in urology by Dr. Louis Meckel (known from kidney stones).  The patient proceeded to transrectal ultrasound with 12 biopsies of the prostate on 04/16/16.  The prostate volume measured 44 cc.  Out of 12 core biopsies, 7 were positive.  The maximum Gleason score was 3+4, and this was seen in left side:   .  The patient reviewed the biopsy results with his urologist and he has kindly been referred today for discussion of potential radiation treatment options.   PREVIOUS RADIATION THERAPY: No  PAST MEDICAL HISTORY:  has a past medical history of GERD (gastroesophageal reflux disease); Hemorrhoids; History of Helicobacter pylori infection; HTN (hypertension); Hyperlipemia; Obesity; Peripheral vascular disease ([place]) (9/14); Prostate cancer ([place]); and Vitamin D deficiency.    PAST SURGICAL HISTORY: Past Surgical History:  Procedure Laterality Date  . CYSTOSCOPY/RETROGRADE/URETEROSCOPY Left 01/02/2013   Procedure: CYSTOSCOPY/LEFT URETERAL WASHINGS/LEFT RETROGRADE PYELOGRAM/ LEFT URETEROSCOPY/URETERAL BIOPSY. BLADDER BIOPSY;  Surgeon: Ardis Hughs, MD;  Location: [facility];  Service: Urology;  Laterality: Left;  With STENT  . HEMORRHOID SURGERY     age 34  . TONSILLECTOMY      FAMILY HISTORY: family history includes Cancer in his mother;  Diabetes in his father; Heart disease in his father.  SOCIAL HISTORY:  reports that he has never smoked. He has never used smokeless tobacco. He reports that he does not drink alcohol or use drugs.  ALLERGIES: Ace inhibitors and Citalopram  MEDICATIONS:  Current Outpatient Prescriptions  Medication Sig Dispense Refill  . aspirin 81 MG tablet Take 162 mg by mouth daily.     . bisoprolol-hydrochlorothiazide (ZIAC) 10-6.25 MG tablet TAKE 1 TABLET BY MOUTH EACH DAY FOR BLOOD PRESSURE 90 tablet 1  . Cholecalciferol (VITAMIN D3) 5000 UNITS CAPS Take 5,000 Units by mouth daily.    Marland Kitchen losartan (COZAAR) 100 MG tablet Take 1 tablet (100 mg total) by mouth daily. 90 tablet 1  . omeprazole (PRILOSEC) 40 MG capsule TAKE 1 CAPSULE BY MOUTH 2 TIMES A DAY. 180 capsule 3  . vitamin B-12 (CYANOCOBALAMIN) 50 MCG tablet Take 50 mcg by mouth daily.     No current facility-administered medications for this encounter.     REVIEW OF SYSTEMS:  A 15 point review of systems is documented in the electronic medical record. This was obtained by the nursing staff. However, I reviewed this with the patient to discuss relevant findings and make appropriate changes.  Pertinent items are noted in HPI..  The patient completed an IPSS and IIEF questionnaire.  His IPSS score was 7 indicating mild urinary outflow obstructive symptoms.   PHYSICAL EXAM: This patient is in no acute distress.  He is alert and oriented.   height is 6' (1.829 m) and weight is 284 lb 3.2 oz (128.9 kg). His blood pressure is  145/94 (abnormal) and his pulse is 64. His respiration is 18 and oxygen saturation is 100%.  He exhibits no respiratory distress or labored breathing.  He appears neurologically intact.  His mood is pleasant.  His affect is appropriate.  Please note the digital rectal exam findings described above.  KPS = 100  100 - Normal; no complaints; no evidence of disease. 90   - Able to carry on normal activity; minor signs or symptoms of  disease. 80   - Normal activity with effort; some signs or symptoms of disease. 26   - Cares for self; unable to carry on normal activity or to do active work. 60   - Requires occasional assistance, but is able to care for most of his personal needs. 50   - Requires considerable assistance and frequent medical care. 73   - Disabled; requires special care and assistance. 74   - Severely disabled; hospital admission is indicated although death not imminent. 16   - Very sick; hospital admission necessary; active supportive treatment necessary. 10   - Moribund; fatal processes progressing rapidly. 0     - Dead  Karnofsky DA, Abelmann Chamisal, Craver LS and [facility] [phone]) The use of the nitrogen mustards in the palliative treatment of carcinoma: with particular reference to bronchogenic carcinoma Cancer 1 [id number]   LABORATORY DATA:  Lab Results  Component Value Date   WBC 11.5 (H) 05/01/2016   HGB 16.3 05/01/2016   HCT 47.6 05/01/2016   MCV 89.0 05/01/2016   PLT 244 05/01/2016   Lab Results  Component Value Date   NA 139 05/01/2016   K 4.5 05/01/2016   CL 100 05/01/2016   CO2 27 05/01/2016   Lab Results  Component Value Date   ALT 37 05/01/2016   AST 31 05/01/2016   ALKPHOS 70 05/01/2016   BILITOT 0.4 05/01/2016     RADIOGRAPHY: No results found.    IMPRESSION: This gentleman is a 66 y.o. gentleman with stage T1c adenocarcinoma of the prostate with a Gleason's score of 3+4 and a PSA of 5.3.  His T-Stage, Gleason's Score, and PSA put him into the intermediate risk group.    He falls into a select sub-set of patients with intermediate risk disease who are eligible for seed implant with primary Gleason grade of 3.   Accordingly he is eligible for a variety of potential treatment options including external radiation and seed implant.  PLAN:  Today I reviewed the findings and workup thus far.  We discussed the natural history of prostate cancer.  We reviewed the the implications of  T-stage, Gleason's Score, and PSA on decision-making and outcomes in prostate cancer.  We discussed radiation treatment in the management of prostate cancer with regard to the logistics and delivery of external beam radiation treatment as well as the logistics and delivery of prostate brachytherapy.  We compared and contrasted each of these approaches and also compared these against prostatectomy.    The patient expressed some interest in external beam radiotherapy.   The patient will think things over and let me know later whether he would like to proceed with prostate IMRT.  I will share my findings with Dr. Louis Meckel and if the patient decides on IMRT, I'll request that we move forward with scheduling placement of three gold fiducial markers into the prostate to proceed with IMRT.     I enjoyed meeting with him today, and will look forward to participating in the care of this very nice  gentleman.   I spent 40 minutes face to face with the patient and more than 50% of that time was spent in counseling and/or coordination of care.   ------------------------------------------------  Sheral Apley. Tammi Klippel, M.D.

## 2016-06-11 NOTE — Progress Notes (Signed)
See progress note under physician encounter. 

## 2016-06-22 ENCOUNTER — Telehealth: Payer: Self-pay | Admitting: Urology

## 2016-06-22 ENCOUNTER — Telehealth: Payer: Self-pay | Admitting: *Deleted

## 2016-06-22 NOTE — Telephone Encounter (Signed)
I called and spoke with the patient this morning to introduce myself and answer any further questions regarding his treatment options for radiotherapy.  He has decided to proceed with prostate IMRT as opposed to brachytherapy due to concerns with anesthesia.  Apparently, he had difficulty recovering from the effects of anesthesia 2-3 years ago when he underwent cystoureteroscopy and prefers to avoid further anesthesia unless absolutely neccessary.  He is scheduled for fiducial marker placement with Dr. Louis Meckel on 07/05/16 at 11:30am and CT SIM in our department on 08/24/16.  All questions were answered to the patient's satisfaction and he expressed his appreciation for the call.

## 2016-06-22 NOTE — Telephone Encounter (Signed)
CALLED PATIENT TO  INFORM OF GOLD SEED PLACEMENT ON 07/05/16 @ 11:30 AM @ [facility] AND HIS SIM ON 08-24-16 @ DR. MANNING'S OFFICE, SPOKE WITH PATIENT AND HE IS AWARE OF THESE APPTS.

## 2016-07-03 ENCOUNTER — Telehealth: Payer: Self-pay | Admitting: Medical Oncology

## 2016-07-03 NOTE — Progress Notes (Signed)
I called Jason Patton to introduce myself as the prostate nurse navigator and discuss my role. I was not abe to meet him the day he consulted with Dr. Tammi Klippel. He is scheduled for placement of gold marker 07/05/16 and for CT simulation 08/24/16. He had questions about the CT sim and these were answered. I will continue to follow and asked he call me with questions and concerns. He voiced understanding.

## 2016-07-25 ENCOUNTER — Other Ambulatory Visit: Payer: Self-pay | Admitting: Internal Medicine

## 2016-08-01 ENCOUNTER — Encounter: Payer: Self-pay | Admitting: Internal Medicine

## 2016-08-01 ENCOUNTER — Ambulatory Visit (INDEPENDENT_AMBULATORY_CARE_PROVIDER_SITE_OTHER): Payer: 59 | Admitting: Internal Medicine

## 2016-08-01 VITALS — BP 140/84 | HR 76 | Temp 97.3°F | Resp 16 | Ht 70.5 in | Wt 285.0 lb

## 2016-08-01 DIAGNOSIS — E782 Mixed hyperlipidemia: Secondary | ICD-10-CM

## 2016-08-01 DIAGNOSIS — I10 Essential (primary) hypertension: Secondary | ICD-10-CM

## 2016-08-01 DIAGNOSIS — K219 Gastro-esophageal reflux disease without esophagitis: Secondary | ICD-10-CM

## 2016-08-01 DIAGNOSIS — R7303 Prediabetes: Secondary | ICD-10-CM

## 2016-08-01 DIAGNOSIS — Z79899 Other long term (current) drug therapy: Secondary | ICD-10-CM | POA: Diagnosis not present

## 2016-08-01 LAB — CBC WITH DIFFERENTIAL/PLATELET
BASOS PCT: 0 %
Basophils Absolute: 0 cells/uL (ref 0–200)
Eosinophils Absolute: 468 cells/uL (ref 15–500)
Eosinophils Relative: 6 %
HEMATOCRIT: 45.7 % (ref 38.5–50.0)
HEMOGLOBIN: 15.5 g/dL (ref 13.2–17.1)
LYMPHS ABS: 2340 {cells}/uL (ref 850–3900)
Lymphocytes Relative: 30 %
MCH: 30.3 pg (ref 27.0–33.0)
MCHC: 33.9 g/dL (ref 32.0–36.0)
MCV: 89.3 fL (ref 80.0–100.0)
MONO ABS: 936 {cells}/uL (ref 200–950)
MPV: 9.9 fL (ref 7.5–12.5)
Monocytes Relative: 12 %
NEUTROS PCT: 52 %
Neutro Abs: 4056 cells/uL (ref 1500–7800)
Platelets: 221 10*3/uL (ref 140–400)
RBC: 5.12 MIL/uL (ref 4.20–5.80)
RDW: 13.6 % (ref 11.0–15.0)
WBC: 7.8 10*3/uL (ref 3.8–10.8)

## 2016-08-01 LAB — LIPID PANEL
CHOL/HDL RATIO: 3.9 ratio (ref <5.0)
Cholesterol: 163 mg/dL (ref <200)
HDL: 42 mg/dL (ref >40)
LDL CALC: 97 mg/dL (ref <100)
Triglycerides: 119 mg/dL (ref <150)
VLDL: 24 mg/dL (ref <30)

## 2016-08-01 LAB — BASIC METABOLIC PANEL WITH GFR
BUN: 11 mg/dL (ref 7–25)
CHLORIDE: 100 mmol/L (ref 98–110)
CO2: 27 mmol/L (ref 20–31)
Calcium: 9.5 mg/dL (ref 8.6–10.3)
Creat: 1.11 mg/dL (ref 0.70–1.25)
GFR, EST NON AFRICAN AMERICAN: 68 mL/min (ref >=60)
GFR, Est African American: 79 mL/min (ref >=60)
GLUCOSE: 88 mg/dL (ref 65–99)
POTASSIUM: 3.9 mmol/L (ref 3.5–5.3)
Sodium: 139 mmol/L (ref 135–146)

## 2016-08-01 LAB — HEPATIC FUNCTION PANEL
ALK PHOS: 69 U/L (ref 40–115)
ALT: 38 U/L (ref 9–46)
AST: 29 U/L (ref 10–35)
Albumin: 3.8 g/dL (ref 3.6–5.1)
BILIRUBIN INDIRECT: 0.3 mg/dL (ref 0.2–1.2)
Bilirubin, Direct: 0.1 mg/dL (ref <=0.2)
TOTAL PROTEIN: 6.4 g/dL (ref 6.1–8.1)
Total Bilirubin: 0.4 mg/dL (ref 0.2–1.2)

## 2016-08-01 LAB — TSH: TSH: 1.84 mIU/L (ref 0.40–4.50)

## 2016-08-01 NOTE — Progress Notes (Signed)
This very nice 66 y.o. MWM presents for 3 month follow up with Hypertension, Hyperlipidemia, Pre-Diabetes and Vitamin D Deficiency. His GERD is controlled with Prilosec & diet. He's been dx'd with Prostate Ca by Dr Louis Meckel  and is scheduled by Dr Tammi Klippel to begin 5 weeks of external beam Radiation.      Patient is treated for HTN (2009) & BP has been controlled at home. Today's BP is sl elevated at 140/84. Patient has had no complaints of any cardiac type chest pain, palpitations, dyspnea/orthopnea/PND, dizziness, claudication, or dependent edema.     Hyperlipidemia is controlled with diet. Last Lipids were at goal: Lab Results  Component Value Date   CHOL 168 05/01/2016   HDL 43 05/01/2016   LDLCALC 101 (H) 05/01/2016   TRIG 119 05/01/2016   CHOLHDL 3.9 05/01/2016      Also, the patient has Morbid Obesity (BMI 40+) and consequent PreDiabetes (2009)  and has had no symptoms of reactive hypoglycemia, diabetic polys, paresthesias or visual blurring.  Last A1c was not at goal: Lab Results  Component Value Date   HGBA1C 6.1 (H) 05/01/2016      Further, the patient also has history of Vitamin D Deficiency ("32" in 2009) and supplements vitamin D without any suspected side-effects. Last vitamin D was at goal: Lab Results  Component Value Date   VD25OH 76 01/17/2016   Current Outpatient Prescriptions on File Prior to Visit  Medication Sig  . aspirin 81 MG tablet Take 162 mg by mouth daily.   . bisoprolol-hydrochlorothiazide (ZIAC) 10-6.25 MG tablet TAKE 1 TABLET BY MOUTH EACH DAY FOR BLOOD PRESSURE  . Cholecalciferol (VITAMIN D3) 5000 UNITS CAPS Take 5,000 Units by mouth daily.  Marland Kitchen losartan (COZAAR) 100 MG tablet TAKE 1 TABLET BY [facility] DAILY  . omeprazole (PRILOSEC) 40 MG capsule TAKE 1 CAPSULE BY MOUTH 2 TIMES A DAY.  . vitamin B-12 (CYANOCOBALAMIN) 50 MCG tablet Take 50 mcg by mouth daily.   No current facility-administered medications on file prior to visit.    Allergies    Allergen Reactions  . Ace Inhibitors Other (See Comments)    Cough   . Citalopram Nausea And Vomiting   PMHx:   Past Medical History:  Diagnosis Date  . GERD (gastroesophageal reflux disease)   . Hemorrhoids   . History of Helicobacter pylori infection   . HTN (hypertension)   . Hyperlipemia    pt denies 08/08/12  . Obesity   . Peripheral vascular disease ([place]) 9/14   DVT  left lower leg  . Prostate cancer ([place])   . Vitamin D deficiency    Immunization History  Administered Date(s) Administered  . Influenza Split 12/23/2013  . Influenza, High Dose Seasonal PF 11/25/2014  . Influenza, Seasonal, Injecte, Preservative Fre 01/17/2016  . Pneumococcal Conjugate-13 12/01/2008   Past Surgical History:  Procedure Laterality Date  . CYSTOSCOPY/RETROGRADE/URETEROSCOPY Left 01/02/2013   Procedure: CYSTOSCOPY/LEFT URETERAL WASHINGS/LEFT RETROGRADE PYELOGRAM/ LEFT URETEROSCOPY/URETERAL BIOPSY. BLADDER BIOPSY;  Surgeon: Ardis Hughs, MD;  Location: [facility];  Service: Urology;  Laterality: Left;  With STENT  . HEMORRHOID SURGERY     age 77  . TONSILLECTOMY     FHx:    Reviewed / unchanged  SHx:    Reviewed / unchanged  Systems Review:  Constitutional: Denies fever, chills, wt changes, headaches, insomnia, fatigue, night sweats, change in appetite. Eyes: Denies redness, blurred vision, diplopia, discharge, itchy, watery eyes.  ENT: Denies discharge, congestion, post nasal drip, epistaxis,  sore throat, earache, hearing loss, dental pain, tinnitus, vertigo, sinus pain, snoring.  CV: Denies chest pain, palpitations, irregular heartbeat, syncope, dyspnea, diaphoresis, orthopnea, PND, claudication or edema. Respiratory: denies cough, dyspnea, DOE, pleurisy, hoarseness, laryngitis, wheezing.  Gastrointestinal: Denies dysphagia, odynophagia, heartburn, reflux, water brash, abdominal pain or cramps, nausea, vomiting, bloating, diarrhea, constipation, hematemesis, melena, hematochezia  or  hemorrhoids. Genitourinary: Denies dysuria, frequency, urgency, nocturia, hesitancy, discharge, hematuria or flank pain. Musculoskeletal: Denies arthralgias, myalgias, stiffness, jt. swelling, pain, limping or strain/sprain.  Skin: Denies pruritus, rash, hives, warts, acne, eczema or change in skin lesion(s). Neuro: No weakness, tremor, incoordination, spasms, paresthesia or pain. Psychiatric: Denies confusion, memory loss or sensory loss. Endo: Denies change in weight, skin or hair change.  Heme/Lymph: No excessive bleeding, bruising or enlarged lymph nodes.  Physical Exam  BP 140/84   Pulse 76   Temp 97.3 F (36.3 C)   Resp 16   Ht 5' 10.5" (1.791 m)   Wt 285 lb (129.3 kg)   BMI 40.32 kg/m   Appears well nourished, well groomed  and in no distress.  Eyes: PERRLA, EOMs, conjunctiva no swelling or erythema. Sinuses: No frontal/maxillary tenderness ENT/Mouth: EAC's clear, TM's nl w/o erythema, bulging. Nares clear w/o erythema, swelling, exudates. Oropharynx clear without erythema or exudates. Oral hygiene is good. Tongue normal, non obstructing. Hearing intact.  Neck: Supple. Thyroid nl. Car 2+/2+ without bruits, nodes or JVD. Chest: Respirations nl with BS clear & equal w/o rales, rhonchi, wheezing or stridor.  Cor: Heart sounds normal w/ regular rate and rhythm without sig. murmurs, gallops, clicks or rubs. Peripheral pulses normal and equal  without edema.  Abdomen: Soft & bowel sounds normal. Non-tender w/o guarding, rebound, hernias, masses or organomegaly.  Lymphatics: Unremarkable.  Musculoskeletal: Full ROM all peripheral extremities, joint stability, 5/5 strength and normal gait.  Skin: Warm, dry without exposed rashes, lesions or ecchymosis apparent.  Neuro: Cranial nerves intact, reflexes equal bilaterally. Sensory-motor testing grossly intact. Tendon reflexes grossly intact.  Pysch: Alert & oriented x 3.  Insight and judgement nl & appropriate. No  ideations.  Assessment and Plan:  1. Essential hypertension  - Continue medication, monitor blood pressure at home.  - Continue DASH diet. Reminder to go to the ER if any CP,  SOB, nausea, dizziness, severe HA, changes vision/speech,  left arm numbness and tingling and jaw pain.  - CBC with Differential/Platelet - BASIC METABOLIC PANEL WITH GFR - Magnesium - TSH  2. Hyperlipidemia, mixed  - Continue diet/meds, exercise,& lifestyle modifications.  - Continue monitor periodic cholesterol/liver & renal functions   - Hepatic function panel - Lipid panel - TSH  3. Prediabetes  - Continue diet, exercise, lifestyle modifications.  - Monitor appropriate labs.  - Hemoglobin A1c - Insulin, random  4. Gastroesophageal reflux disease  - Continue supplementation.  - VITAMIN D 25 Hydroxy   5. Medication management  - CBC with Differential/Platelet - BASIC METABOLIC PANEL WITH GFR - Hepatic function panel - Magnesium - Lipid panel - TSH - Hemoglobin A1c - Insulin, random - VITAMIN D 25 Hydroxy        Discussed  regular exercise, BP monitoring, weight control to achieve/maintain BMI less than 25 and discussed med and SE's. Recommended labs to assess and monitor clinical status with further disposition pending results of labs. Over 30 minutes of exam, counseling, chart review was performed.

## 2016-08-01 NOTE — Patient Instructions (Signed)

## 2016-08-02 LAB — VITAMIN D 25 HYDROXY (VIT D DEFICIENCY, FRACTURES): Vit D, 25-Hydroxy: 65 ng/mL (ref 30–100)

## 2016-08-02 LAB — HEMOGLOBIN A1C
Hgb A1c MFr Bld: 6.2 % — ABNORMAL HIGH (ref <5.7)
MEAN PLASMA GLUCOSE: 131 mg/dL

## 2016-08-02 LAB — INSULIN, RANDOM: Insulin: 19.7 u[IU]/mL — ABNORMAL HIGH (ref 2.0–19.6)

## 2016-08-02 LAB — MAGNESIUM: Magnesium: 2 mg/dL (ref 1.5–2.5)

## 2016-08-24 ENCOUNTER — Ambulatory Visit
Admission: RE | Admit: 2016-08-24 | Discharge: 2016-08-24 | Disposition: A | Discharge status: Home / Self Care | Payer: 59 | Source: Ambulatory Visit | Attending: Radiation Oncology | Admitting: Radiation Oncology

## 2016-08-24 DIAGNOSIS — C61 Malignant neoplasm of prostate: Secondary | ICD-10-CM | POA: Diagnosis not present

## 2016-08-24 DIAGNOSIS — Z510 Encounter for antineoplastic radiation therapy: Secondary | ICD-10-CM | POA: Insufficient documentation

## 2016-08-24 NOTE — Progress Notes (Signed)
  Radiation Oncology         [phone]) [phone] ________________________________  Name: Jason Patton MRN: [id number]  Date: 08/24/2016  DOB: 05/26/1950  SIMULATION AND TREATMENT PLANNING NOTE    ICD-10-CM   1. Prostate cancer [facility]) C61     DIAGNOSIS:  66 y.o. gentleman with stage T1c adenocarcinoma of the prostate with a Gleason's score of 3+4 and a PSA of 5.3  NARRATIVE:  The patient was brought to the [place].  Identity was confirmed.  All relevant records and images related to the planned course of therapy were reviewed.  The patient freely provided informed written consent to proceed with treatment after reviewing the details related to the planned course of therapy. The consent form was witnessed and verified by the simulation staff.  Then, the patient was set-up in a stable reproducible supine position for radiation therapy.  A vacuum lock pillow device was custom fabricated to position his legs in a reproducible immobilized position.  Then, I performed a urethrogram under sterile conditions to identify the prostatic apex.  CT images were obtained.  Surface markings were placed.  The CT images were loaded into the planning software.  Then the prostate target and avoidance structures including the rectum, bladder, bowel and hips were contoured.  Treatment planning then occurred.  The radiation prescription was entered and confirmed.  A total of one complex treatment devices was fabricated. I have requested : Intensity Modulated Radiotherapy (IMRT) is medically necessary for this case for the following reason:  Rectal sparing.Marland Kitchen  PLAN:  The patient will receive 78 Gy in 40 fractions.  ________________________________  Sheral Apley Tammi Klippel, M.D.

## 2016-08-29 DIAGNOSIS — Z510 Encounter for antineoplastic radiation therapy: Secondary | ICD-10-CM | POA: Diagnosis not present

## 2016-09-06 ENCOUNTER — Ambulatory Visit
Admission: RE | Admit: 2016-09-06 | Discharge: 2016-09-06 | Disposition: A | Discharge status: Home / Self Care | Payer: Managed Care, Other (non HMO) | Source: Ambulatory Visit | Attending: Radiation Oncology | Admitting: Radiation Oncology

## 2016-09-06 ENCOUNTER — Encounter: Payer: Self-pay | Admitting: Medical Oncology

## 2016-09-06 DIAGNOSIS — Z510 Encounter for antineoplastic radiation therapy: Secondary | ICD-10-CM | POA: Diagnosis not present

## 2016-09-07 ENCOUNTER — Ambulatory Visit
Admission: RE | Admit: 2016-09-07 | Discharge: 2016-09-07 | Disposition: A | Discharge status: Home / Self Care | Payer: Managed Care, Other (non HMO) | Source: Ambulatory Visit | Attending: Radiation Oncology | Admitting: Radiation Oncology

## 2016-09-07 DIAGNOSIS — Z510 Encounter for antineoplastic radiation therapy: Secondary | ICD-10-CM | POA: Diagnosis not present

## 2016-09-10 ENCOUNTER — Ambulatory Visit
Admission: RE | Admit: 2016-09-10 | Discharge: 2016-09-10 | Disposition: A | Discharge status: Home / Self Care | Payer: Managed Care, Other (non HMO) | Source: Ambulatory Visit | Attending: Radiation Oncology | Admitting: Radiation Oncology

## 2016-09-10 DIAGNOSIS — Z510 Encounter for antineoplastic radiation therapy: Secondary | ICD-10-CM | POA: Diagnosis not present

## 2016-09-11 ENCOUNTER — Ambulatory Visit
Admission: RE | Admit: 2016-09-11 | Discharge: 2016-09-11 | Disposition: A | Discharge status: Home / Self Care | Payer: Managed Care, Other (non HMO) | Source: Ambulatory Visit | Attending: Radiation Oncology | Admitting: Radiation Oncology

## 2016-09-11 DIAGNOSIS — Z510 Encounter for antineoplastic radiation therapy: Secondary | ICD-10-CM | POA: Diagnosis not present

## 2016-09-12 ENCOUNTER — Ambulatory Visit
Admission: RE | Admit: 2016-09-12 | Discharge: 2016-09-12 | Disposition: A | Discharge status: Home / Self Care | Payer: Managed Care, Other (non HMO) | Source: Ambulatory Visit | Attending: Radiation Oncology | Admitting: Radiation Oncology

## 2016-09-12 DIAGNOSIS — Z510 Encounter for antineoplastic radiation therapy: Secondary | ICD-10-CM | POA: Diagnosis not present

## 2016-09-13 ENCOUNTER — Ambulatory Visit
Admission: RE | Admit: 2016-09-13 | Discharge: 2016-09-13 | Disposition: A | Discharge status: Home / Self Care | Payer: Managed Care, Other (non HMO) | Source: Ambulatory Visit | Attending: Radiation Oncology | Admitting: Radiation Oncology

## 2016-09-13 DIAGNOSIS — Z510 Encounter for antineoplastic radiation therapy: Secondary | ICD-10-CM | POA: Diagnosis not present

## 2016-09-14 ENCOUNTER — Ambulatory Visit
Admission: RE | Admit: 2016-09-14 | Discharge: 2016-09-14 | Disposition: A | Discharge status: Home / Self Care | Payer: Managed Care, Other (non HMO) | Source: Ambulatory Visit | Attending: Radiation Oncology | Admitting: Radiation Oncology

## 2016-09-14 DIAGNOSIS — Z510 Encounter for antineoplastic radiation therapy: Secondary | ICD-10-CM | POA: Diagnosis not present

## 2016-09-17 ENCOUNTER — Ambulatory Visit
Admission: RE | Admit: 2016-09-17 | Discharge: 2016-09-17 | Disposition: A | Discharge status: Home / Self Care | Payer: Managed Care, Other (non HMO) | Source: Ambulatory Visit | Attending: Radiation Oncology | Admitting: Radiation Oncology

## 2016-09-17 DIAGNOSIS — Z510 Encounter for antineoplastic radiation therapy: Secondary | ICD-10-CM | POA: Diagnosis not present

## 2016-09-18 ENCOUNTER — Ambulatory Visit
Admission: RE | Admit: 2016-09-18 | Discharge: 2016-09-18 | Disposition: A | Discharge status: Home / Self Care | Payer: Managed Care, Other (non HMO) | Source: Ambulatory Visit | Attending: Radiation Oncology | Admitting: Radiation Oncology

## 2016-09-18 DIAGNOSIS — Z510 Encounter for antineoplastic radiation therapy: Secondary | ICD-10-CM | POA: Diagnosis not present

## 2016-09-19 ENCOUNTER — Ambulatory Visit
Admission: RE | Admit: 2016-09-19 | Discharge: 2016-09-19 | Disposition: A | Discharge status: Home / Self Care | Payer: Managed Care, Other (non HMO) | Source: Ambulatory Visit | Attending: Radiation Oncology | Admitting: Radiation Oncology

## 2016-09-19 DIAGNOSIS — Z510 Encounter for antineoplastic radiation therapy: Secondary | ICD-10-CM | POA: Diagnosis not present

## 2016-09-20 ENCOUNTER — Ambulatory Visit
Admission: RE | Admit: 2016-09-20 | Discharge: 2016-09-20 | Disposition: A | Discharge status: Home / Self Care | Payer: Managed Care, Other (non HMO) | Source: Ambulatory Visit | Attending: Radiation Oncology | Admitting: Radiation Oncology

## 2016-09-20 DIAGNOSIS — Z510 Encounter for antineoplastic radiation therapy: Secondary | ICD-10-CM | POA: Diagnosis not present

## 2016-09-21 ENCOUNTER — Ambulatory Visit
Admission: RE | Admit: 2016-09-21 | Discharge: 2016-09-21 | Disposition: A | Discharge status: Home / Self Care | Payer: Managed Care, Other (non HMO) | Source: Ambulatory Visit | Attending: Radiation Oncology | Admitting: Radiation Oncology

## 2016-09-21 DIAGNOSIS — Z510 Encounter for antineoplastic radiation therapy: Secondary | ICD-10-CM | POA: Diagnosis not present

## 2016-09-24 ENCOUNTER — Ambulatory Visit
Admission: RE | Admit: 2016-09-24 | Discharge: 2016-09-24 | Disposition: A | Discharge status: Home / Self Care | Payer: Managed Care, Other (non HMO) | Source: Ambulatory Visit | Attending: Radiation Oncology | Admitting: Radiation Oncology

## 2016-09-24 DIAGNOSIS — Z510 Encounter for antineoplastic radiation therapy: Secondary | ICD-10-CM | POA: Diagnosis not present

## 2016-09-25 ENCOUNTER — Encounter: Payer: Self-pay | Admitting: Medical Oncology

## 2016-09-25 ENCOUNTER — Ambulatory Visit
Admission: RE | Admit: 2016-09-25 | Discharge: 2016-09-25 | Disposition: A | Discharge status: Home / Self Care | Payer: Managed Care, Other (non HMO) | Source: Ambulatory Visit | Attending: Radiation Oncology | Admitting: Radiation Oncology

## 2016-09-25 DIAGNOSIS — Z510 Encounter for antineoplastic radiation therapy: Secondary | ICD-10-CM | POA: Diagnosis not present

## 2016-09-26 ENCOUNTER — Ambulatory Visit
Admission: RE | Admit: 2016-09-26 | Discharge: 2016-09-26 | Disposition: A | Discharge status: Home / Self Care | Payer: Managed Care, Other (non HMO) | Source: Ambulatory Visit | Attending: Radiation Oncology | Admitting: Radiation Oncology

## 2016-09-26 DIAGNOSIS — Z510 Encounter for antineoplastic radiation therapy: Secondary | ICD-10-CM | POA: Diagnosis not present

## 2016-09-27 ENCOUNTER — Ambulatory Visit
Admission: RE | Admit: 2016-09-27 | Discharge: 2016-09-27 | Disposition: A | Discharge status: Home / Self Care | Payer: Managed Care, Other (non HMO) | Source: Ambulatory Visit | Attending: Radiation Oncology | Admitting: Radiation Oncology

## 2016-09-27 DIAGNOSIS — Z510 Encounter for antineoplastic radiation therapy: Secondary | ICD-10-CM | POA: Diagnosis not present

## 2016-09-28 ENCOUNTER — Ambulatory Visit
Admission: RE | Admit: 2016-09-28 | Discharge: 2016-09-28 | Disposition: A | Discharge status: Home / Self Care | Payer: Managed Care, Other (non HMO) | Source: Ambulatory Visit | Attending: Radiation Oncology | Admitting: Radiation Oncology

## 2016-09-28 DIAGNOSIS — Z510 Encounter for antineoplastic radiation therapy: Secondary | ICD-10-CM | POA: Diagnosis not present

## 2016-10-01 ENCOUNTER — Ambulatory Visit
Admission: RE | Admit: 2016-10-01 | Discharge: 2016-10-01 | Disposition: A | Discharge status: Home / Self Care | Payer: Managed Care, Other (non HMO) | Source: Ambulatory Visit | Attending: Radiation Oncology | Admitting: Radiation Oncology

## 2016-10-01 DIAGNOSIS — Z510 Encounter for antineoplastic radiation therapy: Secondary | ICD-10-CM | POA: Diagnosis not present

## 2016-10-02 ENCOUNTER — Ambulatory Visit
Admission: RE | Admit: 2016-10-02 | Discharge: 2016-10-02 | Disposition: A | Discharge status: Home / Self Care | Payer: Managed Care, Other (non HMO) | Source: Ambulatory Visit | Attending: Radiation Oncology | Admitting: Radiation Oncology

## 2016-10-02 DIAGNOSIS — Z510 Encounter for antineoplastic radiation therapy: Secondary | ICD-10-CM | POA: Diagnosis not present

## 2016-10-03 ENCOUNTER — Ambulatory Visit
Admission: RE | Admit: 2016-10-03 | Discharge: 2016-10-03 | Disposition: A | Discharge status: Home / Self Care | Payer: Managed Care, Other (non HMO) | Source: Ambulatory Visit | Attending: Radiation Oncology | Admitting: Radiation Oncology

## 2016-10-03 DIAGNOSIS — Z510 Encounter for antineoplastic radiation therapy: Secondary | ICD-10-CM | POA: Diagnosis not present

## 2016-10-04 ENCOUNTER — Ambulatory Visit
Admission: RE | Admit: 2016-10-04 | Discharge: 2016-10-04 | Disposition: A | Discharge status: Home / Self Care | Payer: Managed Care, Other (non HMO) | Source: Ambulatory Visit | Attending: Radiation Oncology | Admitting: Radiation Oncology

## 2016-10-04 DIAGNOSIS — Z510 Encounter for antineoplastic radiation therapy: Secondary | ICD-10-CM | POA: Diagnosis not present

## 2016-10-05 ENCOUNTER — Ambulatory Visit
Admission: RE | Admit: 2016-10-05 | Discharge: 2016-10-05 | Disposition: A | Discharge status: Home / Self Care | Payer: Managed Care, Other (non HMO) | Source: Ambulatory Visit | Attending: Radiation Oncology | Admitting: Radiation Oncology

## 2016-10-05 DIAGNOSIS — Z510 Encounter for antineoplastic radiation therapy: Secondary | ICD-10-CM | POA: Diagnosis not present

## 2016-10-08 ENCOUNTER — Ambulatory Visit
Admission: RE | Admit: 2016-10-08 | Discharge: 2016-10-08 | Disposition: A | Discharge status: Home / Self Care | Payer: Managed Care, Other (non HMO) | Source: Ambulatory Visit | Attending: Radiation Oncology | Admitting: Radiation Oncology

## 2016-10-08 DIAGNOSIS — Z510 Encounter for antineoplastic radiation therapy: Secondary | ICD-10-CM | POA: Diagnosis not present

## 2016-10-09 ENCOUNTER — Ambulatory Visit
Admission: RE | Admit: 2016-10-09 | Discharge: 2016-10-09 | Disposition: A | Discharge status: Home / Self Care | Payer: Managed Care, Other (non HMO) | Source: Ambulatory Visit | Attending: Radiation Oncology | Admitting: Radiation Oncology

## 2016-10-09 DIAGNOSIS — Z510 Encounter for antineoplastic radiation therapy: Secondary | ICD-10-CM | POA: Diagnosis not present

## 2016-10-10 ENCOUNTER — Ambulatory Visit
Admission: RE | Admit: 2016-10-10 | Discharge: 2016-10-10 | Disposition: A | Discharge status: Home / Self Care | Payer: Managed Care, Other (non HMO) | Source: Ambulatory Visit | Attending: Radiation Oncology | Admitting: Radiation Oncology

## 2016-10-10 DIAGNOSIS — Z510 Encounter for antineoplastic radiation therapy: Secondary | ICD-10-CM | POA: Diagnosis not present

## 2016-10-11 ENCOUNTER — Ambulatory Visit
Admission: RE | Admit: 2016-10-11 | Discharge: 2016-10-11 | Disposition: A | Discharge status: Home / Self Care | Payer: Managed Care, Other (non HMO) | Source: Ambulatory Visit | Attending: Radiation Oncology | Admitting: Radiation Oncology

## 2016-10-11 DIAGNOSIS — Z510 Encounter for antineoplastic radiation therapy: Secondary | ICD-10-CM | POA: Diagnosis not present

## 2016-10-12 ENCOUNTER — Other Ambulatory Visit: Payer: Self-pay | Admitting: Physician Assistant

## 2016-10-12 ENCOUNTER — Ambulatory Visit
Admission: RE | Admit: 2016-10-12 | Discharge: 2016-10-12 | Disposition: A | Discharge status: Home / Self Care | Payer: Managed Care, Other (non HMO) | Source: Ambulatory Visit | Attending: Radiation Oncology | Admitting: Radiation Oncology

## 2016-10-12 DIAGNOSIS — Z510 Encounter for antineoplastic radiation therapy: Secondary | ICD-10-CM | POA: Diagnosis not present

## 2016-10-15 ENCOUNTER — Ambulatory Visit
Admission: RE | Admit: 2016-10-15 | Discharge: 2016-10-15 | Disposition: A | Discharge status: Home / Self Care | Payer: Managed Care, Other (non HMO) | Source: Ambulatory Visit | Attending: Radiation Oncology | Admitting: Radiation Oncology

## 2016-10-15 DIAGNOSIS — Z510 Encounter for antineoplastic radiation therapy: Secondary | ICD-10-CM | POA: Diagnosis not present

## 2016-10-16 ENCOUNTER — Ambulatory Visit
Admission: RE | Admit: 2016-10-16 | Discharge: 2016-10-16 | Disposition: A | Discharge status: Home / Self Care | Payer: Managed Care, Other (non HMO) | Source: Ambulatory Visit | Attending: Radiation Oncology | Admitting: Radiation Oncology

## 2016-10-16 DIAGNOSIS — Z510 Encounter for antineoplastic radiation therapy: Secondary | ICD-10-CM | POA: Diagnosis not present

## 2016-10-17 ENCOUNTER — Ambulatory Visit
Admission: RE | Admit: 2016-10-17 | Discharge: 2016-10-17 | Disposition: A | Discharge status: Home / Self Care | Payer: Managed Care, Other (non HMO) | Source: Ambulatory Visit | Attending: Radiation Oncology | Admitting: Radiation Oncology

## 2016-10-17 DIAGNOSIS — Z510 Encounter for antineoplastic radiation therapy: Secondary | ICD-10-CM | POA: Diagnosis not present

## 2016-10-18 ENCOUNTER — Ambulatory Visit
Admission: RE | Admit: 2016-10-18 | Discharge: 2016-10-18 | Disposition: A | Discharge status: Home / Self Care | Payer: Managed Care, Other (non HMO) | Source: Ambulatory Visit | Attending: Radiation Oncology | Admitting: Radiation Oncology

## 2016-10-18 DIAGNOSIS — Z510 Encounter for antineoplastic radiation therapy: Secondary | ICD-10-CM | POA: Diagnosis not present

## 2016-10-19 ENCOUNTER — Ambulatory Visit
Admission: RE | Admit: 2016-10-19 | Discharge: 2016-10-19 | Disposition: A | Discharge status: Home / Self Care | Payer: Managed Care, Other (non HMO) | Source: Ambulatory Visit | Attending: Radiation Oncology | Admitting: Radiation Oncology

## 2016-10-19 DIAGNOSIS — Z510 Encounter for antineoplastic radiation therapy: Secondary | ICD-10-CM | POA: Diagnosis not present

## 2016-10-22 ENCOUNTER — Ambulatory Visit
Admission: RE | Admit: 2016-10-22 | Discharge: 2016-10-22 | Disposition: A | Discharge status: Home / Self Care | Payer: Managed Care, Other (non HMO) | Source: Ambulatory Visit | Attending: Radiation Oncology | Admitting: Radiation Oncology

## 2016-10-22 DIAGNOSIS — Z510 Encounter for antineoplastic radiation therapy: Secondary | ICD-10-CM | POA: Diagnosis not present

## 2016-10-23 ENCOUNTER — Ambulatory Visit
Admission: RE | Admit: 2016-10-23 | Discharge: 2016-10-23 | Disposition: A | Discharge status: Home / Self Care | Payer: Managed Care, Other (non HMO) | Source: Ambulatory Visit | Attending: Radiation Oncology | Admitting: Radiation Oncology

## 2016-10-23 DIAGNOSIS — Z510 Encounter for antineoplastic radiation therapy: Secondary | ICD-10-CM | POA: Diagnosis not present

## 2016-10-24 ENCOUNTER — Ambulatory Visit
Admission: RE | Admit: 2016-10-24 | Discharge: 2016-10-24 | Disposition: A | Discharge status: Home / Self Care | Payer: Managed Care, Other (non HMO) | Source: Ambulatory Visit | Attending: Radiation Oncology | Admitting: Radiation Oncology

## 2016-10-24 DIAGNOSIS — Z510 Encounter for antineoplastic radiation therapy: Secondary | ICD-10-CM | POA: Diagnosis not present

## 2016-10-25 ENCOUNTER — Ambulatory Visit
Admission: RE | Admit: 2016-10-25 | Discharge: 2016-10-25 | Disposition: A | Discharge status: Home / Self Care | Payer: Managed Care, Other (non HMO) | Source: Ambulatory Visit | Attending: Radiation Oncology | Admitting: Radiation Oncology

## 2016-10-25 DIAGNOSIS — Z510 Encounter for antineoplastic radiation therapy: Secondary | ICD-10-CM | POA: Diagnosis not present

## 2016-10-26 ENCOUNTER — Ambulatory Visit
Admission: RE | Admit: 2016-10-26 | Discharge: 2016-10-26 | Disposition: A | Discharge status: Home / Self Care | Payer: Managed Care, Other (non HMO) | Source: Ambulatory Visit | Attending: Radiation Oncology | Admitting: Radiation Oncology

## 2016-10-26 DIAGNOSIS — Z510 Encounter for antineoplastic radiation therapy: Secondary | ICD-10-CM | POA: Diagnosis not present

## 2016-10-29 ENCOUNTER — Ambulatory Visit
Admission: RE | Admit: 2016-10-29 | Discharge: 2016-10-29 | Disposition: A | Discharge status: Home / Self Care | Payer: Managed Care, Other (non HMO) | Source: Ambulatory Visit | Attending: Radiation Oncology | Admitting: Radiation Oncology

## 2016-10-29 DIAGNOSIS — Z510 Encounter for antineoplastic radiation therapy: Secondary | ICD-10-CM | POA: Diagnosis not present

## 2016-10-30 ENCOUNTER — Ambulatory Visit
Admission: RE | Admit: 2016-10-30 | Discharge: 2016-10-30 | Disposition: A | Discharge status: Home / Self Care | Payer: Managed Care, Other (non HMO) | Source: Ambulatory Visit | Attending: Radiation Oncology | Admitting: Radiation Oncology

## 2016-10-30 DIAGNOSIS — Z510 Encounter for antineoplastic radiation therapy: Secondary | ICD-10-CM | POA: Diagnosis not present

## 2016-10-31 ENCOUNTER — Ambulatory Visit
Admission: RE | Admit: 2016-10-31 | Discharge: 2016-10-31 | Disposition: A | Discharge status: Home / Self Care | Payer: Managed Care, Other (non HMO) | Source: Ambulatory Visit | Attending: Radiation Oncology | Admitting: Radiation Oncology

## 2016-10-31 ENCOUNTER — Encounter: Payer: Self-pay | Admitting: Radiation Oncology

## 2016-10-31 DIAGNOSIS — Z510 Encounter for antineoplastic radiation therapy: Secondary | ICD-10-CM | POA: Diagnosis not present

## 2016-11-01 NOTE — Progress Notes (Signed)
  Radiation Oncology         ([phone]) [phone] ________________________________  Name: Jason Patton MRN: [id number]  Date: 10/31/2016  DOB: September 25, 1950  End of Treatment Note  Diagnosis:   66 y.o. gentleman with stage T1c adenocarcinoma of the prostate with a Gleason's score of 3+4 and a PSA of 5.3     Indication for treatment:  Curative, Definitive Radiotherapy       Radiation treatment dates:   09/06/2016 to 10/31/2016  Site/dose:   The prostate was treated to 78 Gy in 40 fractions of 1.95 Gy  Beams/energy:   The patient was treated with IMRT using volumetric arc therapy delivering 6 MV X-rays to clockwise and counterclockwise circumferential arcs with a 90 degree collimator offset to avoid dose scalloping.  Image guidance was performed with daily cone beam CT prior to each fraction to align to gold markers in the prostate and assure proper bladder and rectal fill volumes.  Immobilization was achieved with BodyFix custom mold.  Narrative: The patient tolerated radiation treatment relatively well.   The patient experienced some minor urinary irritation including nocturia and incomplete emptying of his bladder. He denies fatigue, dysuria, hematuria, or bowel issues.   Plan: The patient has completed radiation treatment. He will return to radiation oncology clinic for routine followup in one month. I advised him to call or return sooner if he has any questions or concerns related to his recovery or treatment. ________________________________  Sheral Apley. Tammi Klippel, M.D.  This document serves as a record of services personally performed by Tyler Pita, MD. It was created on his behalf by Arlyce Harman, a trained medical scribe. The creation of this record is based on the scribe's personal observations and the provider's statements to them. This document has been checked and approved by the attending provider.

## 2016-11-14 NOTE — Progress Notes (Signed)
Patient ID: Jason Patton, male   DOB: [birth year], 66 y.o.   MRN: [id number]  Assessment and Plan:  Hypertension:  -Continue medication,  -monitor blood pressure at home.  -Continue DASH diet.   -Reminder to go to the ER if any CP, SOB, nausea, dizziness, severe HA, changes vision/speech, left arm numbness and tingling, and jaw pain.  Cholesterol: -Continue diet and exercise.  -Check cholesterol.   Pre-diabetes: -Continue diet and exercise.  -Check A1C  Vitamin D Def: -check level -continue medications.   Morbid Obesity with co morbidities - long discussion about weight loss, diet, and exercise  Prostate cancer S/p radiation treatment, doing well, no symptoms  Continue diet and meds as discussed. Further disposition pending results of labs.  HPI 66 y.o. male  presents for 3 month follow up with hypertension, hyperlipidemia, prediabetes and vitamin D.   His blood pressure has been controlled at home, today their BP is BP: 132/80.   He does not workout. He denies chest pain, shortness of breath, dizziness. Seeing Dr. Tammi Klippel for prostate cancer, finished last radiation treatment 10/31/2016. Urinary stream is improving, no blood in urine, no pain.   For GERD he is down to 20mg  daily.   He is not on cholesterol medication and denies myalgias. His cholesterol is at goal. The cholesterol last visit was:   Lab Results  Component Value Date   CHOL 163 08/01/2016   HDL 42 08/01/2016   LDLCALC 97 08/01/2016   TRIG 119 08/01/2016   CHOLHDL 3.9 08/01/2016    He has been working on diet and exercise for prediabetes, and denies foot ulcerations, hyperglycemia, hypoglycemia , increased appetite, nausea, paresthesia of the feet, polydipsia, polyuria, visual disturbances, vomiting and weight loss. Last A1C in the office was:  Lab Results  Component Value Date   HGBA1C 6.2 (H) 08/01/2016   Patient is on Vitamin D supplement.  Lab Results  Component Value Date   VD25OH 65 08/01/2016     BMI is Body mass index is 39.55 kg/m., he is working on diet and exercise. Wt Readings from Last 3 Encounters:  11/15/16 279 lb 9.6 oz (126.8 kg)  08/01/16 285 lb (129.3 kg)  06/11/16 284 lb 3.2 oz (128.9 kg)     Current Medications:  Current Outpatient Prescriptions on File Prior to Visit  Medication Sig Dispense Refill  . aspirin 81 MG tablet Take 162 mg by mouth daily.     . bisoprolol-hydrochlorothiazide (ZIAC) 10-6.25 MG tablet TAKE 1 TABLET BY MOUTH EACH DAY FOR BLOOD PRESSURE 90 tablet 1  . Cholecalciferol (VITAMIN D3) 5000 UNITS CAPS Take 5,000 Units by mouth daily.    Marland Kitchen losartan (COZAAR) 100 MG tablet TAKE 1 TABLET BY [facility] DAILY 90 tablet 1  . omeprazole (PRILOSEC) 40 MG capsule TAKE 1 CAPSULE BY MOUTH 2 TIMES A DAY. 180 capsule 3  . vitamin B-12 (CYANOCOBALAMIN) 50 MCG tablet Take 50 mcg by mouth daily.     No current facility-administered medications on file prior to visit.     Medical History:  Past Medical History:  Diagnosis Date  . GERD (gastroesophageal reflux disease)   . Hemorrhoids   . History of Helicobacter pylori infection   . HTN (hypertension)   . Hyperlipemia    pt denies 08/08/12  . Obesity   . Peripheral vascular disease ([place]) 9/14   DVT  left lower leg  . Prostate cancer ([place])   . Vitamin D deficiency     Allergies:  Allergies  Allergen Reactions  .  Ace Inhibitors Other (See Comments)    Cough   . Citalopram Nausea And Vomiting     Review of Systems:  Review of Systems  Constitutional: Negative for chills, fever and malaise/fatigue.  HENT: Negative for congestion, ear pain and sore throat.   Eyes: Negative.   Respiratory: Negative for cough, shortness of breath and wheezing.   Cardiovascular: Negative for chest pain, palpitations and leg swelling.  Gastrointestinal: Negative for abdominal pain, blood in stool, constipation, diarrhea, heartburn and melena.  Genitourinary: Negative.   Musculoskeletal: Negative for joint pain.  Skin:  Negative.   Neurological: Negative for dizziness, sensory change, loss of consciousness and headaches.  Psychiatric/Behavioral: Negative for depression. The patient is not nervous/anxious and does not have insomnia.     Family history- Review and unchanged  Social history- Review and unchanged  Physical Exam: BP 132/80   Pulse 85   Temp (!) 97.4 F (36.3 C)   Resp 16   Ht 5' 10.5" (1.791 m)   Wt 279 lb 9.6 oz (126.8 kg)   SpO2 96%   BMI 39.55 kg/m  Wt Readings from Last 3 Encounters:  11/15/16 279 lb 9.6 oz (126.8 kg)  08/01/16 285 lb (129.3 kg)  06/11/16 284 lb 3.2 oz (128.9 kg)    General Appearance: Well nourished well developed, in no apparent distress. Eyes: PERRLA, EOMs, conjunctiva no swelling or erythema ENT/Mouth: Ear canals normal without obstruction, swelling, erythma, discharge.  TMs normal bilaterally.  Oropharynx moist, clear, without exudate, or postoropharyngeal swelling. Neck: Supple, thyroid normal,no cervical adenopathy  Respiratory: Respiratory effort normal, Breath sounds clear A&P without rhonchi, wheeze, or rale.  No retractions, no accessory usage. Cardio: RRR with no MRGs. Brisk peripheral pulses without edema.  Abdomen: Soft, + BS,  Non tender, no guarding, rebound, hernias, masses. Musculoskeletal: Full ROM, 5/5 strength, Normal gait Skin: Warm, dry without rashes, lesions, ecchymosis.  Neuro: Awake and oriented X 3, Cranial nerves intact. Normal muscle tone, no cerebellar symptoms. Psych: Normal affect, Insight and Judgment appropriate.    Vicie Mutters, PA-C 2:47 PM [facility]

## 2016-11-15 ENCOUNTER — Ambulatory Visit (INDEPENDENT_AMBULATORY_CARE_PROVIDER_SITE_OTHER): Payer: 59 | Admitting: Physician Assistant

## 2016-11-15 ENCOUNTER — Encounter: Payer: Self-pay | Admitting: Physician Assistant

## 2016-11-15 VITALS — BP 132/80 | HR 85 | Temp 97.4°F | Resp 16 | Ht 70.5 in | Wt 279.6 lb

## 2016-11-15 DIAGNOSIS — Z79899 Other long term (current) drug therapy: Secondary | ICD-10-CM

## 2016-11-15 DIAGNOSIS — I10 Essential (primary) hypertension: Secondary | ICD-10-CM

## 2016-11-15 DIAGNOSIS — N182 Chronic kidney disease, stage 2 (mild): Secondary | ICD-10-CM | POA: Diagnosis not present

## 2016-11-15 DIAGNOSIS — E782 Mixed hyperlipidemia: Secondary | ICD-10-CM

## 2016-11-15 DIAGNOSIS — C61 Malignant neoplasm of prostate: Secondary | ICD-10-CM | POA: Diagnosis not present

## 2016-11-15 DIAGNOSIS — R7309 Other abnormal glucose: Secondary | ICD-10-CM

## 2016-11-15 NOTE — Patient Instructions (Signed)
GETTING OFF OF PPI's    Nexium/protonix/prilosec/Omeprazole/Dexilant/Aciphex are called PPI's, they are great at healing your stomach but should only be taken for a short period of time.     Recent studies have shown that taken for a long time they  can increase the risk of osteoporosis (weakening of your bones), pneumonia, low magnesium, restless legs, Cdiff (infection that causes diarrhea), DEMENTIA and most recently kidney damage / disease / insufficiency.     Due to this information we want to try to stop the PPI but if you try to stop it abruptly this can cause rebound acid and worsening symptoms.   So this is how we want you to get off the PPI:  - Start taking the nexium/protonix/prilosec/PPI  every other day with  zantac (ranitidine) 2 x a day for 2-4 weeks - some people stay on this dosage and can not taper off further. Our main goal is to limit the dosage and amount you are taking so if you need to stay on this dose.   - then decrease the PPI to every 3 days while taking the zantac (ranitidine) 300mg  twice a day the other  days for 2-4  Weeks  - then you can try the zantac (ranitidine) 300mg  once at night or up to 2 x day as needed.  - you can continue on this once at night or stop all together  - Avoid alcohol, spicy foods, NSAIDS (aleve, ibuprofen) at this time. See foods below.   +++++++++++++++++++++++++++++++++++++++++++  Food Choices for Gastroesophageal Reflux Disease  When you have gastroesophageal reflux disease (GERD), the foods you eat and your eating habits are very important. Choosing the right foods can help ease the discomfort of GERD. WHAT GENERAL GUIDELINES DO I NEED TO FOLLOW?  Choose fruits, vegetables, whole grains, low-fat dairy products, and low-fat meat, fish, and poultry.  Limit fats such as oils, salad dressings, butter, nuts, and avocado.  Keep a food diary to identify foods that cause symptoms.  Avoid foods that cause reflux. These may be  different for different people.  Eat frequent small meals instead of three large meals each day.  Eat your meals slowly, in a relaxed setting.  Limit fried foods.  Cook foods using methods other than frying.  Avoid drinking alcohol.  Avoid drinking large amounts of liquids with your meals.  Avoid bending over or lying down until 2-3 hours after eating.   WHAT FOODS ARE NOT RECOMMENDED? The following are some foods and drinks that may worsen your symptoms:  Vegetables Tomatoes. Tomato juice. Tomato and spaghetti sauce. Chili peppers. Onion and garlic. Horseradish. Fruits Oranges, grapefruit, and lemon (fruit and juice). Meats High-fat meats, fish, and poultry. This includes hot dogs, ribs, ham, sausage, salami, and bacon. Dairy Whole milk and chocolate milk. Sour cream. Cream. Butter. Ice cream. Cream cheese.  Beverages Coffee and tea, with or without caffeine. Carbonated beverages or energy drinks. Condiments Hot sauce. Barbecue sauce.  Sweets/Desserts Chocolate and cocoa. Donuts. Peppermint and spearmint. Fats and Oils High-fat foods, including [place] fries and potato chips. Other Vinegar. Strong spices, such as black pepper, white pepper, red pepper, cayenne, curry powder, cloves, ginger, and chili powder. Nexium/protonix/prilosec are called PPI's, they are great at healing your stomach but should only be taken for a short period of time.   We want weight loss that will last so you should lose 1-2 pounds a week.  THAT IS IT! Please pick THREE things a month to change. Once it is a  habit check off the item. Then pick another three items off the list to become habits.  If you are already doing a habit on the list GREAT!  Cross that item off! o Don't drink your calories. Ie, alcohol, soda, fruit juice, and sweet tea.  o Drink more water. Drink a glass when you feel hungry or before each meal.  o Eat breakfast - Complex carb and protein (likeDannon light and fit yogurt,  oatmeal, fruit, eggs, [place] bacon). o Measure your cereal.  Eat no more than one cup a day. (ie [place]) o Eat an apple a day. o Add a vegetable a day. o Try a new vegetable a month. o Use Pam! Stop using oil or butter to cook. o Don't finish your plate or use smaller plates. o Share your dessert. o Eat sugar free Jello for dessert or frozen grapes. o Don't eat 2-3 hours before bed. o Switch to whole wheat bread, pasta, and brown rice. o Make healthier choices when you eat out. No fries! o Pick baked chicken, NOT fried. o Don't forget to SLOW DOWN when you eat. It is not going anywhere.  o Take the stairs. o Park far away in the parking lot o [employer] (or weights) for 10 minutes while watching TV. o Walk at work for 10 minutes during break. o Walk outside 1 time a week with your friend, kids, dog, or significant other. o Start a walking group at [place] the mall as much as you can tolerate.  o Keep a food diary. o Weigh yourself daily. o Walk for 15 minutes 3 days per week. o Cook at home more often and eat out less.  If life happens and you go back to old habits, it is okay.  Just start over. You can do it!   If you experience chest pain, get short of breath, or tired during the exercise, please stop immediately and inform your doctor.    Drink 80-100 oz a day of water, measure it out Eat 3 meals a day, have to do breakfast, eat protein- hard boiled eggs, protein bar like [place] protein bar, greek yogurt like oikos triple zero, chobani 100, or light n fit greek     Bad carbs also include fruit juice, alcohol, and sweet tea. These are empty calories that do not signal to your brain that you are full.   Please remember the good carbs are still carbs which convert into sugar. So please measure them out no more than 1/2-1 cup of rice, oatmeal, pasta, and beans  Veggies are however free foods! Pile them on.   Not all fruit is created equal. Please see the list  below, the fruit at the bottom is higher in sugars than the fruit at the top. Please avoid all dried fruits.

## 2016-11-16 LAB — CBC WITH DIFFERENTIAL/PLATELET
BASOS ABS: 48 {cells}/uL (ref 0–200)
BASOS PCT: 0.7 %
EOS ABS: 269 {cells}/uL (ref 15–500)
Eosinophils Relative: 3.9 %
HEMATOCRIT: 46.6 % (ref 38.5–50.0)
HEMOGLOBIN: 16 g/dL (ref 13.2–17.1)
LYMPHS ABS: 1097 {cells}/uL (ref 850–3900)
MCH: 30.7 pg (ref 27.0–33.0)
MCHC: 34.3 g/dL (ref 32.0–36.0)
MCV: 89.4 fL (ref 80.0–100.0)
MPV: 10.5 fL (ref 7.5–12.5)
Monocytes Relative: 10.8 %
NEUTROS ABS: 4740 {cells}/uL (ref 1500–7800)
Neutrophils Relative %: 68.7 %
Platelets: 234 10*3/uL (ref 140–400)
RBC: 5.21 10*6/uL (ref 4.20–5.80)
RDW: 13.1 % (ref 11.0–15.0)
Total Lymphocyte: 15.9 %
WBC mixed population: 745 cells/uL (ref 200–950)
WBC: 6.9 10*3/uL (ref 3.8–10.8)

## 2016-11-16 LAB — LIPID PANEL
CHOLESTEROL: 165 mg/dL (ref <200)
HDL: 45 mg/dL (ref >40)
LDL Cholesterol (Calc): 97 mg/dL (calc)
NON-HDL CHOLESTEROL (CALC): 120 mg/dL (ref <130)
TRIGLYCERIDES: 126 mg/dL (ref <150)
Total CHOL/HDL Ratio: 3.7 (calc) (ref <5.0)

## 2016-11-16 LAB — HEPATIC FUNCTION PANEL
AG RATIO: 1.5 (calc) (ref 1.0–2.5)
ALKALINE PHOSPHATASE (APISO): 73 U/L (ref 40–115)
ALT: 46 U/L (ref 9–46)
AST: 32 U/L (ref 10–35)
Albumin: 4.1 g/dL (ref 3.6–5.1)
BILIRUBIN INDIRECT: 0.4 mg/dL (ref 0.2–1.2)
Bilirubin, Direct: 0.1 mg/dL (ref 0.0–0.2)
Globulin: 2.7 g/dL (calc) (ref 1.9–3.7)
TOTAL PROTEIN: 6.8 g/dL (ref 6.1–8.1)
Total Bilirubin: 0.5 mg/dL (ref 0.2–1.2)

## 2016-11-16 LAB — BASIC METABOLIC PANEL WITH GFR
BUN: 18 mg/dL (ref 7–25)
CHLORIDE: 100 mmol/L (ref 98–110)
CO2: 29 mmol/L (ref 20–32)
Calcium: 9.6 mg/dL (ref 8.6–10.3)
Creat: 1.2 mg/dL (ref 0.70–1.25)
GFR, Est African American: 72 mL/min/{1.73_m2} (ref >=60)
GFR, Est Non African American: 62 mL/min/{1.73_m2} (ref >=60)
GLUCOSE: 134 mg/dL — AB (ref 65–99)
POTASSIUM: 3.8 mmol/L (ref 3.5–5.3)
SODIUM: 140 mmol/L (ref 135–146)

## 2016-11-16 LAB — HEMOGLOBIN A1C
EAG (MMOL/L): 7 (calc)
HEMOGLOBIN A1C: 6 %{Hb} — AB (ref <5.7)
Mean Plasma Glucose: 126 (calc)

## 2016-11-16 LAB — MAGNESIUM: Magnesium: 2 mg/dL (ref 1.5–2.5)

## 2016-11-16 LAB — TSH: TSH: 0.88 mIU/L (ref 0.40–4.50)

## 2016-11-19 NOTE — Progress Notes (Signed)
Pt aware of lab results & voiced understanding of those results.

## 2016-11-30 ENCOUNTER — Ambulatory Visit
Admission: RE | Admit: 2016-11-30 | Discharge: 2016-11-30 | Disposition: A | Discharge status: Home / Self Care | Payer: 59 | Source: Ambulatory Visit | Attending: Urology | Admitting: Urology

## 2016-11-30 ENCOUNTER — Encounter: Payer: Self-pay | Admitting: Urology

## 2016-11-30 VITALS — BP 145/79 | HR 78 | Temp 98.0°F | Resp 20 | Ht 70.5 in | Wt 284.0 lb

## 2016-11-30 DIAGNOSIS — Z510 Encounter for antineoplastic radiation therapy: Secondary | ICD-10-CM | POA: Diagnosis present

## 2016-11-30 DIAGNOSIS — C61 Malignant neoplasm of prostate: Secondary | ICD-10-CM | POA: Diagnosis not present

## 2016-11-30 NOTE — Progress Notes (Signed)
Radiation Oncology         (336) [phone] ________________________________  Name: Jason Patton MRN: [id number]  Date: 11/30/2016  DOB: 09-Jun-1950  Post Treatment Note  CC: Unk Pinto, MD  Ardis Hughs, MD  Diagnosis:   66 y.o. gentleman with stage T1c adenocarcinoma of the prostate with a Gleason's score of 3+4 and a PSA of 5.3   Interval Since Last Radiation:  4 weeks  09/06/2016 to 10/31/2016:   The prostate was treated to 78 Gy in 40 fractions of 1.95 Gy  Narrative:  The patient returns today for routine follow-up.  He tolerated radiation treatment relatively well.   The patient experienced some minor urinary irritation including nocturia and incomplete emptying of his bladder. He denied fatigue, dysuria, hematuria, or bowel issues.                               On review of systems, the patient states that he is doing well overall.  He reports significant improvement in his force of stream over the past week and denies gross hematuria, dysuria, increased frequency, urgency, incomplete emptying or incontinence.  He reports nocturia x1/night.  His bowel movements have returned to normal and he denies abdominal pain, N/V or diarrhea.  His energy level is back to normal.  ALLERGIES:  is allergic to ace inhibitors and citalopram.  Meds: Current Outpatient Prescriptions  Medication Sig Dispense Refill  . aspirin 81 MG tablet Take 162 mg by mouth daily.     . bisoprolol-hydrochlorothiazide (ZIAC) 10-6.25 MG tablet TAKE 1 TABLET BY MOUTH EACH DAY FOR BLOOD PRESSURE 90 tablet 1  . Cholecalciferol (VITAMIN D3) 5000 UNITS CAPS Take 5,000 Units by mouth daily.    Marland Kitchen losartan (COZAAR) 100 MG tablet TAKE 1 TABLET BY [facility] DAILY 90 tablet 1  . omeprazole (PRILOSEC) 40 MG capsule TAKE 1 CAPSULE BY MOUTH 2 TIMES A DAY. 180 capsule 3  . vitamin B-12 (CYANOCOBALAMIN) 50 MCG tablet Take 50 mcg by mouth daily.     No current facility-administered medications for this encounter.      Physical Findings:  height is 5' 10.5" (1.791 m) and weight is 284 lb (128.8 kg). His oral temperature is 98 F (36.7 C). His blood pressure is 145/79 (abnormal) and his pulse is 78. His respiration is 20 and oxygen saturation is 98%.  Pain Assessment Pain Score: 0-No pain/10 In general this is a well appearing caucasian male in no acute distress. He's alert and oriented x4 and appropriate throughout the examination. Cardiopulmonary assessment is negative for acute distress and he exhibits normal effort.   Lab Findings: Lab Results  Component Value Date   WBC 6.9 11/15/2016   HGB 16.0 11/15/2016   HCT 46.6 11/15/2016   MCV 89.4 11/15/2016   PLT 234 11/15/2016     Radiographic Findings: No results found.  Impression/Plan: 1. 67 y.o. gentleman with stage T1c adenocarcinoma of the prostate with a Gleason's score of 3+4 and a PSA of 5.3. He will continue to follow up with urology for ongoing PSA determinations and has an appointment scheduled with Dr. Louis Meckel on 02/11/17 at 1:45pm. He understands what to expect with regards to PSA monitoring going forward. I will look forward to following his response to treatment via correspondence with urology, and would be happy to continue to participate in his care if clinically indicated. I talked to the patient about what to expect in the future, including  his risk for erectile dysfunction and rectal bleeding. I encouraged him to call or return to the office if he has any questions regarding his previous radiation or possible radiation side effects. He was comfortable with this plan and will follow up as needed.    Nicholos Johns, PA-C

## 2016-11-30 NOTE — Addendum Note (Signed)
Encounter addended by: Malena Edman, RN on: 11/30/2016  3:01 PM<BR>    Actions taken: Charge Capture section accepted

## 2016-12-26 ENCOUNTER — Other Ambulatory Visit: Payer: Self-pay | Admitting: Internal Medicine

## 2017-01-31 ENCOUNTER — Telehealth: Payer: Self-pay | Admitting: Internal Medicine

## 2017-01-31 NOTE — Telephone Encounter (Signed)
Spoke with patient, per Dr Melford Aase, time to do Cologuard test. First, Please check  with your insurance for coverage and call our office to order this test. Patient in agreement. Mailed brochure with cpt codes for insurance purposes.

## 2017-02-28 ENCOUNTER — Encounter: Payer: Self-pay | Admitting: Internal Medicine

## 2017-03-20 ENCOUNTER — Encounter: Payer: Self-pay | Admitting: Internal Medicine

## 2017-03-20 ENCOUNTER — Ambulatory Visit: Payer: 59 | Admitting: Internal Medicine

## 2017-03-20 VITALS — BP 130/84 | HR 76 | Temp 97.7°F | Resp 18 | Ht 70.0 in | Wt 277.6 lb

## 2017-03-20 DIAGNOSIS — Z0000 Encounter for general adult medical examination without abnormal findings: Secondary | ICD-10-CM

## 2017-03-20 DIAGNOSIS — Z136 Encounter for screening for cardiovascular disorders: Secondary | ICD-10-CM | POA: Diagnosis not present

## 2017-03-20 DIAGNOSIS — I10 Essential (primary) hypertension: Secondary | ICD-10-CM | POA: Diagnosis not present

## 2017-03-20 DIAGNOSIS — E559 Vitamin D deficiency, unspecified: Secondary | ICD-10-CM

## 2017-03-20 DIAGNOSIS — Z8546 Personal history of malignant neoplasm of prostate: Secondary | ICD-10-CM

## 2017-03-20 DIAGNOSIS — Z79899 Other long term (current) drug therapy: Secondary | ICD-10-CM

## 2017-03-20 DIAGNOSIS — Z1212 Encounter for screening for malignant neoplasm of rectum: Secondary | ICD-10-CM

## 2017-03-20 DIAGNOSIS — Z125 Encounter for screening for malignant neoplasm of prostate: Secondary | ICD-10-CM

## 2017-03-20 DIAGNOSIS — Z23 Encounter for immunization: Secondary | ICD-10-CM

## 2017-03-20 DIAGNOSIS — R7309 Other abnormal glucose: Secondary | ICD-10-CM

## 2017-03-20 DIAGNOSIS — Z1211 Encounter for screening for malignant neoplasm of colon: Secondary | ICD-10-CM

## 2017-03-20 DIAGNOSIS — E782 Mixed hyperlipidemia: Secondary | ICD-10-CM

## 2017-03-20 DIAGNOSIS — Z0001 Encounter for general adult medical examination with abnormal findings: Secondary | ICD-10-CM

## 2017-03-20 DIAGNOSIS — R7303 Prediabetes: Secondary | ICD-10-CM

## 2017-03-20 NOTE — Patient Instructions (Signed)

## 2017-03-20 NOTE — Progress Notes (Signed)
[facility]   Unk Pinto, M.D.     Uvaldo Bristle. Silverio Lay, P.A.-C Liane Comber, [place]                [address] [place], [place] [address] Telephone [phone] Telefax [phone] Annual  Screening/Preventative Visit  & Comprehensive Evaluation & Examination     This very nice 67 y.o. MWM presents for a Screening/Preventative Visit & comprehensive evaluation and management of multiple medical co-morbidities.  Patient has been followed for HTN, Prediabetes, Hyperlipidemia and Vitamin D Deficiency. Also, patient has GERD controlled on his meds.  Patient also was dx'd with Prostate Ca last year treated by 2 Radiation sessions July 5 thru Aug 29 and is followed by Dr Valere Dross and Louis Meckel.      HTN predates circa 2009. Patient's BP has been controlled at home.  Today's BP is at goal - 130/84. Patient denies any cardiac symptoms as chest pain, palpitations, shortness of breath, dizziness or ankle swelling.     Patient's hyperlipidemia is controlled with diet and medications. Patient denies myalgias or other medication SE's. Last lipids were at goal: Lab Results  Component Value Date   CHOL 165 11/15/2016   HDL 45 11/15/2016   LDLCALC 97 08/01/2016   TRIG 126 11/15/2016   CHOLHDL 3.7 11/15/2016      Patient has  Morbid Obesity (BMI 39+) and prediabetes (2009) and patient denies reactive hypoglycemic symptoms, visual blurring, diabetic polys or paresthesias. Last A1c was not at goal: Lab Results  Component Value Date   HGBA1C 6.0 (H) 11/15/2016       Finally, patient has history of Vitamin D Deficiency ("32"/2009) and last vitamin D was at goal: Lab Results  Component Value Date   VD25OH 65 08/01/2016   Current Outpatient Medications on File Prior to Visit  Medication Sig  . aspirin 81 MG tablet Take 162 mg by mouth daily.   . bisoprolol-hydrochlorothiazide (ZIAC) 10-6.25 MG tablet TAKE 1  TABLET BY MOUTH EACH DAY FOR BLOOD PRESSURE  . Cholecalciferol (VITAMIN D3) 5000 UNITS CAPS Take 5,000 Units by mouth daily.  Marland Kitchen losartan (COZAAR) 100 MG tablet TAKE 1 TABLET BY [facility] DAILY  . omeprazole (PRILOSEC) 40 MG capsule TAKE 1 CAPSULE BY MOUTH 2 TIMES A DAY. (Patient taking differently: take 1 capsule daily)  . vitamin B-12 (CYANOCOBALAMIN) 50 MCG tablet Take 50 mcg by mouth daily.   No current facility-administered medications on file prior to visit.    Allergies  Allergen Reactions  . Ace Inhibitors Other (See Comments)    Cough   . Citalopram Nausea And Vomiting   Past Medical History:  Diagnosis Date  . GERD (gastroesophageal reflux disease)   . Hemorrhoids   . History of Helicobacter pylori infection   . HTN (hypertension)   . Hyperlipemia    pt denies 08/08/12  . Obesity   . Peripheral vascular disease ([place]) 9/14   DVT  left lower leg  . Prostate cancer ([place])   . Vitamin D deficiency    Health Maintenance  Topic Date Due  . Hepatitis C Screening  04-20-1950  . FOOT EXAM  03/18/1959  . OPHTHALMOLOGY EXAM  03/18/1959  . COLONOSCOPY  03/18/1999  . PNA vac Low Risk Adult (1 of 2 - PCV13) 03/17/2014  . INFLUENZA VACCINE  10/03/2016  . HEMOGLOBIN A1C  05/15/2017  . TETANUS/TDAP  12/02/2018   Immunization History  Administered Date(s) Administered  . Influenza Split 12/23/2013  . Influenza, High Dose Seasonal PF 11/25/2014  . Influenza, Seasonal, Injecte, Preservative Fre 01/17/2016  . Pneumococcal Polysaccharide-23 12/01/2008  . Tdap 12/01/2008   Last Colon -  Past Surgical History:  Procedure Laterality Date  . CYSTOSCOPY/RETROGRADE/URETEROSCOPY Left 01/02/2013   Procedure: CYSTOSCOPY/LEFT URETERAL WASHINGS/LEFT RETROGRADE PYELOGRAM/ LEFT URETEROSCOPY/URETERAL BIOPSY. BLADDER BIOPSY;  Surgeon: Ardis Hughs, MD;  Location: [facility];  Service: Urology;  Laterality: Left;  With STENT  . HEMORRHOID SURGERY     age 12  . TONSILLECTOMY     Family History   Problem Relation Age of Onset  . Diabetes Father   . Heart disease Father   . Cancer Mother        Bladder   Social History   Socioeconomic History  . Marital status: Married    Spouse name: Not on file  . Number of children: 1  Occupational History  . Occupation: Occupational hygienist: [employer]  Tobacco Use  . Smoking status: Never Smoker  . Smokeless tobacco: Never Used  Substance and Sexual Activity  . Alcohol use: No  . Drug use: No  . Sexual activity: Yes    ROS Constitutional: Denies fever, chills, weight loss/gain, headaches, insomnia,  night sweats or change in appetite. Does c/o fatigue. Eyes: Denies redness, blurred vision, diplopia, discharge, itchy or watery eyes.  ENT: Denies discharge, congestion, post nasal drip, epistaxis, sore throat, earache, hearing loss, dental pain, Tinnitus, Vertigo, Sinus pain or snoring.  Cardio: Denies chest pain, palpitations, irregular heartbeat, syncope, dyspnea, diaphoresis, orthopnea, PND, claudication or edema Respiratory: denies cough, dyspnea, DOE, pleurisy, hoarseness, laryngitis or wheezing.  Gastrointestinal: Denies dysphagia, heartburn, reflux, water brash, pain, cramps, nausea, vomiting, bloating, diarrhea, constipation, hematemesis, melena, hematochezia, jaundice or hemorrhoids Genitourinary: Denies dysuria, frequency, urgency, nocturia, hesitancy, discharge, hematuria or flank pain Musculoskeletal: Denies arthralgia, myalgia, stiffness, Jt. Swelling, pain, limp or strain/sprain. Denies Falls. Skin: Denies puritis, rash, hives, warts, acne, eczema or change in skin lesion Neuro: No weakness, tremor, incoordination, spasms, paresthesia or pain Psychiatric: Denies confusion, memory loss or sensory loss. Denies Depression. Endocrine: Denies change in weight, skin, hair change, nocturia, and paresthesia, diabetic polys, visual blurring or hyper / hypo glycemic episodes.  Heme/Lymph: No excessive bleeding,  bruising or enlarged lymph nodes.  Physical Exam  BP 130/84   Pulse 76   Temp 97.7 F (36.5 C)   Resp 18   Ht 5\' 10"  (1.778 m)   Wt 277 lb 9.6 oz (125.9 kg)   BMI 39.83 kg/m   General Appearance: Well nourished and well groomed and in no apparent distress.  Eyes: PERRLA, EOMs, conjunctiva no swelling or erythema, normal fundi and vessels. Sinuses: No frontal/maxillary tenderness ENT/Mouth: EACs patent / TMs  nl. Nares clear without erythema, swelling, mucoid exudates. Oral hygiene is good. No erythema, swelling, or exudate. Tongue normal, non-obstructing. Tonsils not swollen or erythematous. Hearing normal.  Neck: Supple, thyroid normal. No bruits, nodes or JVD. Respiratory: Respiratory effort normal.  BS equal and clear bilateral without rales, rhonci, wheezing or stridor. Cardio: Heart sounds are normal with regular rate and rhythm and no murmurs, rubs or gallops. Peripheral pulses are normal and equal bilaterally without edema. No aortic or femoral bruits. Chest: symmetric with normal excursions and percussion.  Abdomen: Soft, rotund with Nl bowel sounds. Nontender, no guarding, rebound, hernias, masses, or organomegaly.  Lymphatics: Non tender without lymphadenopathy.  Genitourinary:  DRE - deferred to Dr Louis Meckel. Musculoskeletal: Full ROM all peripheral extremities, joint stability, 5/5 strength, and normal gait. Skin: Warm and dry without rashes, lesions, cyanosis, clubbing or  ecchymosis.  Neuro: Cranial nerves intact, reflexes equal bilaterally. Normal muscle tone, no cerebellar symptoms. Sensation intact.  Pysch: Alert and oriented X 3 with normal affect, insight and judgment appropriate.   Assessment and Plan  1. Annual Preventative/Screening Exam   2. Essential hypertension  - EKG 12-Lead - [place], RETROPERITNL ABD,  LTD - Urinalysis, Routine w reflex microscopic - Microalbumin / creatinine urine ratio - CBC with Differential/Platelet - BASIC METABOLIC PANEL WITH  GFR - Magnesium - TSH  3. Hyperlipidemia, mixed  - EKG 12-Lead - [place], RETROPERITNL ABD,  LTD - Hepatic function panel - Lipid panel - TSH  4. Prediabetes  - EKG 12-Lead - [place], RETROPERITNL ABD,  LTD - Hemoglobin A1c - Insulin, random  5. Vitamin D deficiency  - VITAMIN D 25 Hydroxy   6. Abnormal glucose  - Hemoglobin A1c - Insulin, random  7. History of prostate cancer   8. Prostate cancer screening   9. Screening for colorectal cancer  - POC Hemoccult Bld/Stl   10. Screening for ischemic heart disease  - EKG 12-Lead  11. Screening for AAA (aortic abdominal aneurysm)  - [place], RETROPERITNL ABD,  LTD  12. Medication management  - Urinalysis, Routine w reflex microscopic - Microalbumin / creatinine urine ratio - CBC with Differential/Platelet - BASIC METABOLIC PANEL WITH GFR - Hepatic function panel - Magnesium - Lipid panel - TSH - Hemoglobin A1c - Insulin, random - VITAMIN D 25 Hydroxy         Patient was counseled in prudent diet, weight control to achieve/maintain BMI less than 25, BP monitoring, regular exercise and medications as discussed.  Discussed med effects and SE's. Routine screening labs and tests as requested with regular follow-up as recommended. Over 40 minutes of exam, counseling, chart review and high complex critical decision making was performed

## 2017-03-21 ENCOUNTER — Encounter: Payer: Self-pay | Admitting: *Deleted

## 2017-03-21 LAB — CBC WITH DIFFERENTIAL/PLATELET
BASOS PCT: 0.7 %
Basophils Absolute: 48 cells/uL (ref 0–200)
EOS PCT: 3.2 %
Eosinophils Absolute: 221 cells/uL (ref 15–500)
HEMATOCRIT: 47.1 % (ref 38.5–50.0)
HEMOGLOBIN: 16.1 g/dL (ref 13.2–17.1)
LYMPHS ABS: 1346 {cells}/uL (ref 850–3900)
MCH: 30 pg (ref 27.0–33.0)
MCHC: 34.2 g/dL (ref 32.0–36.0)
MCV: 87.9 fL (ref 80.0–100.0)
MPV: 10.3 fL (ref 7.5–12.5)
Monocytes Relative: 10.9 %
NEUTROS ABS: 4533 {cells}/uL (ref 1500–7800)
Neutrophils Relative %: 65.7 %
Platelets: 249 10*3/uL (ref 140–400)
RBC: 5.36 10*6/uL (ref 4.20–5.80)
RDW: 12.5 % (ref 11.0–15.0)
Total Lymphocyte: 19.5 %
WBC mixed population: 752 cells/uL (ref 200–950)
WBC: 6.9 10*3/uL (ref 3.8–10.8)

## 2017-03-21 LAB — HEPATIC FUNCTION PANEL
AG Ratio: 1.6 (calc) (ref 1.0–2.5)
ALBUMIN MSPROF: 4.2 g/dL (ref 3.6–5.1)
ALT: 48 U/L — AB (ref 9–46)
AST: 33 U/L (ref 10–35)
Alkaline phosphatase (APISO): 69 U/L (ref 40–115)
Bilirubin, Direct: 0.1 mg/dL (ref 0.0–0.2)
GLOBULIN: 2.7 g/dL (ref 1.9–3.7)
Indirect Bilirubin: 0.4 mg/dL (calc) (ref 0.2–1.2)
Total Bilirubin: 0.5 mg/dL (ref 0.2–1.2)
Total Protein: 6.9 g/dL (ref 6.1–8.1)

## 2017-03-21 LAB — BASIC METABOLIC PANEL WITH GFR
BUN/Creatinine Ratio: 18 (calc) (ref 6–22)
BUN: 23 mg/dL (ref 7–25)
CALCIUM: 9.8 mg/dL (ref 8.6–10.3)
CHLORIDE: 99 mmol/L (ref 98–110)
CO2: 31 mmol/L (ref 20–32)
Creat: 1.27 mg/dL — ABNORMAL HIGH (ref 0.70–1.25)
GFR, EST NON AFRICAN AMERICAN: 58 mL/min/{1.73_m2} — AB (ref >=60)
GFR, Est African American: 67 mL/min/{1.73_m2} (ref >=60)
Glucose, Bld: 94 mg/dL (ref 65–99)
Potassium: 4.5 mmol/L (ref 3.5–5.3)
Sodium: 140 mmol/L (ref 135–146)

## 2017-03-21 LAB — URINALYSIS, ROUTINE W REFLEX MICROSCOPIC
Bilirubin Urine: NEGATIVE
GLUCOSE, UA: NEGATIVE
Hgb urine dipstick: NEGATIVE
KETONES UR: NEGATIVE
Leukocytes, UA: NEGATIVE
NITRITE: NEGATIVE
PH: 5.5 (ref 5.0–8.0)
Protein, ur: NEGATIVE
SPECIFIC GRAVITY, URINE: 1.032 (ref 1.001–1.03)

## 2017-03-21 LAB — LIPID PANEL
Cholesterol: 160 mg/dL (ref <200)
HDL: 45 mg/dL (ref >40)
LDL Cholesterol (Calc): 94 mg/dL (calc)
Non-HDL Cholesterol (Calc): 115 mg/dL (calc) (ref <130)
TRIGLYCERIDES: 118 mg/dL (ref <150)
Total CHOL/HDL Ratio: 3.6 (calc) (ref <5.0)

## 2017-03-21 LAB — HEMOGLOBIN A1C
Hgb A1c MFr Bld: 6.1 % of total Hgb — ABNORMAL HIGH (ref <5.7)
Mean Plasma Glucose: 128 (calc)
eAG (mmol/L): 7.1 (calc)

## 2017-03-21 LAB — MICROALBUMIN / CREATININE URINE RATIO
Creatinine, Urine: 259 mg/dL (ref 20–320)
MICROALB UR: 1.2 mg/dL
Microalb Creat Ratio: 5 mcg/mg creat (ref <30)

## 2017-03-21 LAB — INSULIN, RANDOM: INSULIN: 36.2 u[IU]/mL — AB (ref 2.0–19.6)

## 2017-03-21 LAB — TSH: TSH: 1.29 mIU/L (ref 0.40–4.50)

## 2017-03-21 LAB — MAGNESIUM: MAGNESIUM: 2.1 mg/dL (ref 1.5–2.5)

## 2017-03-21 LAB — VITAMIN D 25 HYDROXY (VIT D DEFICIENCY, FRACTURES): VIT D 25 HYDROXY: 71 ng/mL (ref 30–100)

## 2017-03-21 NOTE — Addendum Note (Signed)
Addended by: Melbourne Abts C on: 03/21/2017 08:39 AM   Modules accepted: Orders

## 2017-09-23 DIAGNOSIS — K219 Gastro-esophageal reflux disease without esophagitis: Secondary | ICD-10-CM | POA: Insufficient documentation

## 2017-09-23 NOTE — Progress Notes (Signed)
FOLLOW UP  Assessment and Plan:   Hypertension Well controlled with current medications  Monitor blood pressure at home; patient to call if consistently greater than 130/80 Continue DASH diet.   Reminder to go to the ER if any CP, SOB, nausea, dizziness, severe HA, changes vision/speech, left arm numbness and tingling and jaw pain.  Cholesterol Currently at goal;  Continue low cholesterol diet and exercise.  Check lipid panel.   Prediabetes Continue diet and exercise.  Perform daily foot/skin check, notify office of any concerning changes.  Check A1C  Obesity with co morbidities Long discussion about weight loss, diet, and exercise Recommended diet heavy in fruits and veggies and low in animal meats, cheeses, and dairy products, appropriate calorie intake Discussed ideal weight for height, he is working towards weight goal of 220 lb Will follow up in 3 months  Vitamin D Def At goal at last visit; continue supplementation to maintain goal of 70-100 Defer Vit D level  Continue diet and meds as discussed. Further disposition pending results of labs. Discussed med's effects and SE's.   Over 30 minutes of exam, counseling, chart review, and critical decision making was performed.   Future Appointments  Date Time Provider [place]  04/22/2018  3:00 PM Unk Pinto, MD [facility] None    ----------------------------------------------------------------------------------------------------------------------  HPI 67 y.o. male  presents for 3 month follow up on hypertension, cholesterol, prediabetes, morbid obesity and vitamin D deficiency. Patient was diagnosed with Prostate Cancer in 2018 and was treated by 40 Radiation sessions July 5 thru Aug 29 and is followed by Dr. Louis Meckel, with last PSA 1.1 2 weeks ago. The patient has GERD controlled on his meds.    BMI is Body mass index is 39.31 kg/m., he has been working on diet, he stopped drinking sweet tea on January 3rd  and hasn't had since.  Wt Readings from Last 3 Encounters:  09/24/17 274 lb (124.3 kg)  03/20/17 277 lb 9.6 oz (125.9 kg)  11/30/16 284 lb (128.8 kg)   His blood pressure has been controlled at home, today their BP is BP: 110/68  He does not workout, but walks a lot at work all day. He denies chest pain, shortness of breath, dizziness.   He is not on cholesterol medication and denies myalgias. His cholesterol is at goal. The cholesterol last visit was:   Lab Results  Component Value Date   CHOL 160 03/20/2017   HDL 45 03/20/2017   LDLCALC 94 03/20/2017   TRIG 118 03/20/2017   CHOLHDL 3.6 03/20/2017    He has been working on diet for prediabetes, and denies foot ulcerations, increased appetite, nausea, paresthesia of the feet, polydipsia, polyuria, visual disturbances and vomiting. Last A1C in the office was:  Lab Results  Component Value Date   HGBA1C 6.1 (H) 03/20/2017   Patient is on Vitamin D supplement.   Lab Results  Component Value Date   VD25OH 33 03/20/2017        Current Medications:  Current Outpatient Medications on File Prior to Visit  Medication Sig  . aspirin 81 MG tablet Take 162 mg by mouth daily.   . bisoprolol-hydrochlorothiazide (ZIAC) 10-6.25 MG tablet TAKE 1 TABLET BY MOUTH EACH DAY FOR BLOOD PRESSURE  . Cholecalciferol (VITAMIN D3) 5000 UNITS CAPS Take 5,000 Units by mouth daily.  Marland Kitchen losartan (COZAAR) 100 MG tablet TAKE 1 TABLET BY [facility] DAILY (Patient taking differently: TAKE 1/2 TABLET BY [facility] DAILY)  . omeprazole (PRILOSEC) 40 MG capsule TAKE 1 CAPSULE  BY MOUTH 2 TIMES A DAY. (Patient taking differently: take 1 capsule daily)  . vitamin B-12 (CYANOCOBALAMIN) 50 MCG tablet Take 50 mcg by mouth daily.   No current facility-administered medications on file prior to visit.      Allergies:  Allergies  Allergen Reactions  . Ace Inhibitors Other (See Comments)    Cough   . Citalopram Nausea And Vomiting     Medical History:  Past Medical  History:  Diagnosis Date  . GERD (gastroesophageal reflux disease)   . Hemorrhoids   . History of Helicobacter pylori infection   . HTN (hypertension)   . Hyperlipemia    pt denies 08/08/12  . Obesity   . Peripheral vascular disease ([place]) 9/14   DVT  left lower leg  . Prostate cancer ([place])   . Vitamin D deficiency    Family history- Reviewed and unchanged Social history- Reviewed and unchanged   Review of Systems:  Review of Systems  Constitutional: Negative for malaise/fatigue and weight loss.  HENT: Negative for hearing loss and tinnitus.   Eyes: Negative for blurred vision and double vision.  Respiratory: Negative for cough, shortness of breath and wheezing.   Cardiovascular: Negative for chest pain, palpitations, orthopnea, claudication and leg swelling.  Gastrointestinal: Negative for abdominal pain, blood in stool, constipation, diarrhea, heartburn, melena, nausea and vomiting.  Genitourinary: Negative.   Musculoskeletal: Negative for joint pain and myalgias.  Skin: Negative for rash.  Neurological: Negative for dizziness, tingling, sensory change, weakness and headaches.  Endo/Heme/Allergies: Negative for polydipsia.  Psychiatric/Behavioral: Negative.   All other systems reviewed and are negative.   Physical Exam: BP 110/68   Pulse 70   Temp (!) 97.5 F (36.4 C)   Ht 5\' 10"  (1.778 m)   Wt 274 lb (124.3 kg)   SpO2 97%   BMI 39.31 kg/m  Wt Readings from Last 3 Encounters:  09/24/17 274 lb (124.3 kg)  03/20/17 277 lb 9.6 oz (125.9 kg)  11/30/16 284 lb (128.8 kg)   General Appearance: Well nourished, in no apparent distress. Eyes: PERRLA, EOMs, conjunctiva no swelling or erythema Sinuses: No Frontal/maxillary tenderness ENT/Mouth: Ext aud canals clear, TMs without erythema, bulging. No erythema, swelling, or exudate on post pharynx.  Tonsils not swollen or erythematous. Hearing normal.  Neck: Supple, thyroid normal.  Respiratory: Respiratory effort normal, BS  equal bilaterally without rales, rhonchi, wheezing or stridor.  Cardio: RRR with no MRGs. Brisk peripheral pulses without edema.  Abdomen: Soft, + BS.  Non tender, no guarding, rebound, hernias, masses. Lymphatics: Non tender without lymphadenopathy.  Musculoskeletal: Full ROM, 5/5 strength, Normal gait Skin: Warm, dry without rashes, lesions, ecchymosis.  Neuro: Cranial nerves intact. No cerebellar symptoms.  Psych: Awake and oriented X 3, normal affect, Insight and Judgment appropriate.    Izora Ribas, NP 2:37 PM [facility]

## 2017-09-24 ENCOUNTER — Encounter: Payer: Self-pay | Admitting: Adult Health

## 2017-09-24 ENCOUNTER — Ambulatory Visit (INDEPENDENT_AMBULATORY_CARE_PROVIDER_SITE_OTHER): Payer: 59 | Admitting: Adult Health

## 2017-09-24 VITALS — BP 110/68 | HR 70 | Temp 97.5°F | Ht 70.0 in | Wt 274.0 lb

## 2017-09-24 DIAGNOSIS — E782 Mixed hyperlipidemia: Secondary | ICD-10-CM

## 2017-09-24 DIAGNOSIS — E559 Vitamin D deficiency, unspecified: Secondary | ICD-10-CM

## 2017-09-24 DIAGNOSIS — K219 Gastro-esophageal reflux disease without esophagitis: Secondary | ICD-10-CM

## 2017-09-24 DIAGNOSIS — N182 Chronic kidney disease, stage 2 (mild): Secondary | ICD-10-CM | POA: Diagnosis not present

## 2017-09-24 DIAGNOSIS — I10 Essential (primary) hypertension: Secondary | ICD-10-CM | POA: Diagnosis not present

## 2017-09-24 DIAGNOSIS — R7303 Prediabetes: Secondary | ICD-10-CM

## 2017-09-24 DIAGNOSIS — Z79899 Other long term (current) drug therapy: Secondary | ICD-10-CM

## 2017-09-24 NOTE — Patient Instructions (Signed)
Aim for 7+ servings of fruits and vegetables daily  80+ fluid ounces of water or unsweet tea for healthy kidneys  Limit alcohol intake  Limit animal fats in diet for cholesterol and heart health - choose grass fed whenever available  Aim for low stress - take time to unwind and care for your mental health  Aim for 150 min of moderate intensity exercise weekly for heart health, and weights twice weekly for bone health  Aim for 7-9 hours of sleep daily    Drink 1/2 your body weight in fluid ounces of water daily; drink a tall glass of water 30 min before meals  Don't eat until you're stuffed- listen to your stomach and eat until you are 80% full   Try eating off of a salad plate; wait 10 min after finishing before going back for seconds  Start by eating the vegetables on your plate; aim for 50% of your meals to be fruits or vegetables  Then eat your protein - lean meats (grass fed if possible), fish, beans, nuts in moderation  Eat your carbs/starch last ONLY if you still are hungry. If you can, stop before finishing it all  Avoid sugar and flour - the closer it looks to it's original form in nature, typically the better it is for you  Splurge in moderation - "assign" days when you get to splurge and have the "bad stuff" - I like to follow a 80% - 20% plan- "good" choices 80 % of the time, "bad" choices in moderation 20% of the time  Simple equation is: Calories out > calories in = weight loss - even if you eat the bad stuff, if you limit portions, you will still lose weight       When it comes to diets, agreement about the perfect plan isn't easy to find, even among the experts. Experts at the [employer] developed an idea known as the Healthy Eating Plate. Just imagine a plate divided into logical, healthy portions.  The emphasis is on diet quality:  Load up on vegetables and fruits - one-half of your plate: Aim for color and variety, and remember that  potatoes don't count.  Go for whole grains - one-quarter of your plate: Whole wheat, barley, wheat berries, quinoa, oats, brown rice, and foods made with them. If you want pasta, go with whole wheat pasta.  Protein power - one-quarter of your plate: Fish, chicken, beans, and nuts are all healthy, versatile protein sources. Limit red meat.  The diet, however, does go beyond the plate, offering a few other suggestions.  Use healthy plant oils, such as olive, canola, soy, corn, sunflower and peanut. Check the labels, and avoid partially hydrogenated oil, which have unhealthy trans fats.  If you're thirsty, drink water. Coffee and tea are good in moderation, but skip sugary drinks and limit milk and dairy products to one or two daily servings.  The type of carbohydrate in the diet is more important than the amount. Some sources of carbohydrates, such as vegetables, fruits, whole grains, and beans-are healthier than others.  Finally, stay active.

## 2017-09-25 LAB — CBC WITH DIFFERENTIAL/PLATELET
BASOS ABS: 30 {cells}/uL (ref 0–200)
BASOS PCT: 0.4 %
EOS ABS: 363 {cells}/uL (ref 15–500)
Eosinophils Relative: 4.9 %
HCT: 46.4 % (ref 38.5–50.0)
Hemoglobin: 16.3 g/dL (ref 13.2–17.1)
Lymphs Abs: 1798 cells/uL (ref 850–3900)
MCH: 31.6 pg (ref 27.0–33.0)
MCHC: 35.1 g/dL (ref 32.0–36.0)
MCV: 89.9 fL (ref 80.0–100.0)
MPV: 10.5 fL (ref 7.5–12.5)
Monocytes Relative: 10.1 %
NEUTROS PCT: 60.3 %
Neutro Abs: 4462 cells/uL (ref 1500–7800)
PLATELETS: 219 10*3/uL (ref 140–400)
RBC: 5.16 10*6/uL (ref 4.20–5.80)
RDW: 12.2 % (ref 11.0–15.0)
TOTAL LYMPHOCYTE: 24.3 %
WBC mixed population: 747 cells/uL (ref 200–950)
WBC: 7.4 10*3/uL (ref 3.8–10.8)

## 2017-09-25 LAB — LIPID PANEL
CHOL/HDL RATIO: 3.7 (calc) (ref <5.0)
Cholesterol: 168 mg/dL (ref <200)
HDL: 45 mg/dL (ref >40)
LDL Cholesterol (Calc): 97 mg/dL (calc)
NON-HDL CHOLESTEROL (CALC): 123 mg/dL (ref <130)
Triglycerides: 157 mg/dL — ABNORMAL HIGH (ref <150)

## 2017-09-25 LAB — COMPLETE METABOLIC PANEL WITH GFR
AG RATIO: 1.6 (calc) (ref 1.0–2.5)
ALBUMIN MSPROF: 4.3 g/dL (ref 3.6–5.1)
ALKALINE PHOSPHATASE (APISO): 74 U/L (ref 40–115)
ALT: 22 U/L (ref 9–46)
AST: 21 U/L (ref 10–35)
BUN: 16 mg/dL (ref 7–25)
CO2: 29 mmol/L (ref 20–32)
CREATININE: 1.18 mg/dL (ref 0.70–1.25)
Calcium: 9.7 mg/dL (ref 8.6–10.3)
Chloride: 99 mmol/L (ref 98–110)
GFR, Est African American: 73 mL/min/{1.73_m2} (ref >=60)
GFR, Est Non African American: 63 mL/min/{1.73_m2} (ref >=60)
GLOBULIN: 2.7 g/dL (ref 1.9–3.7)
Glucose, Bld: 137 mg/dL — ABNORMAL HIGH (ref 65–99)
POTASSIUM: 3.9 mmol/L (ref 3.5–5.3)
SODIUM: 138 mmol/L (ref 135–146)
Total Bilirubin: 0.5 mg/dL (ref 0.2–1.2)
Total Protein: 7 g/dL (ref 6.1–8.1)

## 2017-09-25 LAB — HEMOGLOBIN A1C
EAG (MMOL/L): 7.1 (calc)
HEMOGLOBIN A1C: 6.1 %{Hb} — AB (ref <5.7)
MEAN PLASMA GLUCOSE: 128 (calc)

## 2017-09-25 LAB — TSH: TSH: 0.99 mIU/L (ref 0.40–4.50)

## 2017-09-25 LAB — MAGNESIUM: Magnesium: 2 mg/dL (ref 1.5–2.5)

## 2017-09-26 ENCOUNTER — Other Ambulatory Visit: Payer: Self-pay | Admitting: Internal Medicine

## 2017-11-11 ENCOUNTER — Other Ambulatory Visit: Payer: Self-pay | Admitting: Internal Medicine

## 2018-01-09 ENCOUNTER — Encounter: Payer: Self-pay | Admitting: Adult Health

## 2018-01-09 ENCOUNTER — Ambulatory Visit: Payer: 59 | Admitting: Adult Health

## 2018-01-09 VITALS — BP 124/82 | HR 81 | Temp 97.2°F | Ht 70.0 in | Wt 278.0 lb

## 2018-01-09 DIAGNOSIS — H1032 Unspecified acute conjunctivitis, left eye: Secondary | ICD-10-CM | POA: Diagnosis not present

## 2018-01-09 DIAGNOSIS — J040 Acute laryngitis: Secondary | ICD-10-CM | POA: Diagnosis not present

## 2018-01-09 MED ORDER — NEOMYCIN-POLYMYXIN-DEXAMETH 3.5-10000-0.1 OP SUSP: 1.0000 [drp] | Freq: Four times a day (QID) | OPHTHALMIC | 1 refills | Status: DC

## 2018-01-09 NOTE — Patient Instructions (Signed)

## 2018-01-09 NOTE — Progress Notes (Signed)
Assessment and Plan:  Jason Patton was seen today for laryngitis and conjunctivitis.  Diagnoses and all orders for this visit:  Laryngitis, acute Discussed the importance of avoiding unnecessary antibiotic therapy, likely viral and self-limited Suggested symptomatic OTC remedies, delsym for very mild cough, sample given oral steroids offered, patient declines Follow up as needed if not improving, if developing a fever or productive cough  Acute conjunctivitis of left eye, unspecified acute conjunctivitis type  Discussed the diagnosis and proper care of conjunctivitis.  Stressed household Nurse, mental health. Ophthalmic drops per orders. Local eye care discussed. Hygiene explained, if symptoms increase Eye Doctor ASAP -     neomycin-polymyxin b-dexamethasone (MAXITROL) 3.5-10000-0.1 SUSP; Place 1 drop into the left eye every 6 (six) hours.  Further disposition pending results of labs. Discussed med's effects and SE's.   Over 15 minutes of exam, counseling, chart review, and critical decision making was performed.   Future Appointments  Date Time Provider Veteran  04/22/2018  3:00 PM Unk Pinto, MD [facility] None    ------------------------------------------------------------------------------------------------------------------   HPI BP 124/82   Pulse 81   Temp (!) 97.2 F (36.2 C)   Ht 5\' 10"  (1.778 m)   Wt 278 lb (126.1 kg)   SpO2 93%   BMI 39.89 kg/m   67 y.o.male with hx of htn, prostate CA, presents for 1 week of URI symptoms; began with scratchy throat, mild symptoms over the weekend, then intensified on Monday, more scratchy throat, "almost like an allergy." Very mild nasal congestion, denies headache, fever/chills, facial pain, ear pressure/pain, sneezing. He does endorse he was having watery discharge from eyes, and woke up with left eye very injected, matted close in the AM, with continued watery discharge. Eye is improved today. Denies pain, tenderness, vision changes.    He does have hx of GERD but symptoms well controlled with prilosec without recent cough, reflux, epigastric burning, belching. On prilosec 40 mg daily. Denies hx of allergies.  He took some coricedin OTC which has helped with sleep at night.   Past Medical History:  Diagnosis Date  . GERD (gastroesophageal reflux disease)   . Hemorrhoids   . History of Helicobacter pylori infection   . HTN (hypertension)   . Hyperlipemia    pt denies 08/08/12  . Obesity   . Peripheral vascular disease ([place]) 9/14   DVT  left lower leg  . Prostate cancer ([place])   . Vitamin D deficiency      Allergies  Allergen Reactions  . Ace Inhibitors Other (See Comments)    Cough   . Citalopram Nausea And Vomiting    Current Outpatient Medications on File Prior to Visit  Medication Sig  . aspirin 81 MG tablet Take 162 mg by mouth daily.   . bisoprolol-hydrochlorothiazide (ZIAC) 10-6.25 MG tablet TAKE 1 TABLET BY MOUTH ONCE DAILY FOR BLOOD PRESSURE  . Cholecalciferol (VITAMIN D3) 5000 UNITS CAPS Take 5,000 Units by mouth daily.  Marland Kitchen losartan (COZAAR) 100 MG tablet TAKE 1 TABLET BY [facility] DAILY  . omeprazole (PRILOSEC) 40 MG capsule TAKE 1 CAPSULE BY MOUTH 2 TIMES A DAY. (Patient taking differently: take 1 capsule daily)  . vitamin B-12 (CYANOCOBALAMIN) 50 MCG tablet Take 50 mcg by mouth daily.   No current facility-administered medications on file prior to visit.     ROS: all negative except above.   Physical Exam:  BP 124/82   Pulse 81   Temp (!) 97.2 F (36.2 C)   Ht 5\' 10"  (1.778 m)   Wt  278 lb (126.1 kg)   SpO2 93%   BMI 39.89 kg/m   General Appearance: Well nourished, in no apparent distress. Eyes: PERRLA, EOMs, R conjunctiva no swelling or erythema, L conjunctiva hypervascular/erythematous without notable purulent discharge, no matting to lashes, globe not firm or tender, no foreign body Sinuses: No Frontal/maxillary tenderness ENT/Mouth: Ext aud canals clear, TMs without erythema, bulging.  No erythema, swelling, or exudate on post pharynx.  Tonsils not swollen or erythematous. Hearing normal. Hoarse vocal quality.  Neck: Supple Respiratory: Respiratory effort normal, BS equal bilaterally without rales, rhonchi, wheezing or stridor.  Cardio: RRR with no MRGs. Brisk peripheral pulses without edema.  Abdomen: Soft, + BS.  Non tender. Lymphatics: Non tender without lymphadenopathy.  Musculoskeletal: normal gait.  Skin: Warm, dry without rashes, lesions, ecchymosis.  Neuro: Cranial nerves intact. Normal muscle tone Psych: Awake and oriented X 3, normal affect, Insight and Judgment appropriate.    Izora Ribas, NP 3:43 PM [facility]

## 2018-01-10 ENCOUNTER — Other Ambulatory Visit: Payer: Self-pay | Admitting: Adult Health Nurse Practitioner

## 2018-01-10 ENCOUNTER — Telehealth: Payer: Self-pay

## 2018-01-10 MED ORDER — TOBRAMYCIN-DEXAMETHASONE 0.3-0.1 % OP SUSP: 2.0000 [drp] | OPHTHALMIC | 0 refills | Status: DC

## 2018-01-10 NOTE — Telephone Encounter (Signed)
Sent in tobradex.

## 2018-01-10 NOTE — Telephone Encounter (Signed)
Can you contact to patient to see if he can go to another [facility].  Can you check [facility] for availability.  Thanks

## 2018-01-10 NOTE — Telephone Encounter (Signed)
Patient called and states that the pharmacy carries Tobradex. Please send to [facility]

## 2018-01-10 NOTE — Addendum Note (Signed)
Addended byGarnet Sierras A on: 01/10/2018 01:16 PM   Modules accepted: Orders

## 2018-01-10 NOTE — Telephone Encounter (Signed)
Pharmacy requesting a change in medication, was prescribed Maxitrol and is on back order. Please advise

## 2018-01-16 ENCOUNTER — Other Ambulatory Visit: Payer: Self-pay | Admitting: Adult Health

## 2018-03-19 ENCOUNTER — Encounter: Payer: Self-pay | Admitting: Internal Medicine

## 2018-03-28 DIAGNOSIS — Z8546 Personal history of malignant neoplasm of prostate: Secondary | ICD-10-CM | POA: Diagnosis not present

## 2018-03-31 DIAGNOSIS — Z8546 Personal history of malignant neoplasm of prostate: Secondary | ICD-10-CM | POA: Diagnosis not present

## 2018-03-31 DIAGNOSIS — N3943 Post-void dribbling: Secondary | ICD-10-CM | POA: Diagnosis not present

## 2018-04-21 ENCOUNTER — Encounter: Payer: Self-pay | Admitting: Internal Medicine

## 2018-04-21 DIAGNOSIS — Z8249 Family history of ischemic heart disease and other diseases of the circulatory system: Secondary | ICD-10-CM | POA: Insufficient documentation

## 2018-04-21 DIAGNOSIS — Z8546 Personal history of malignant neoplasm of prostate: Secondary | ICD-10-CM | POA: Insufficient documentation

## 2018-04-21 DIAGNOSIS — R7309 Other abnormal glucose: Secondary | ICD-10-CM

## 2018-04-21 DIAGNOSIS — N138 Other obstructive and reflux uropathy: Secondary | ICD-10-CM | POA: Insufficient documentation

## 2018-04-21 DIAGNOSIS — N401 Enlarged prostate with lower urinary tract symptoms: Secondary | ICD-10-CM

## 2018-04-21 NOTE — Patient Instructions (Signed)

## 2018-04-21 NOTE — Progress Notes (Addendum)
[facility]   Unk Pinto, M.D.     Uvaldo Bristle. Silverio Lay, P.A.-C Liane Comber, Iva                [address] [place], [place] [address] Telephone [phone] Telefax [phone] Annual  Screening/Preventative Visit  & Comprehensive Evaluation & Examination     This very nice 68 y.o. MWM  presents for a Screening /Preventative Visit & comprehensive evaluation and management of multiple medical co-morbidities.  Patient has been followed for HTN, HLD, Prediabetes and Vitamin D Deficiency. Patient has GERD controlled with his meds. Patient has Prostate Ca treated in 2018 with #40 radiotherapy sessions. Patient also has concerns re: #10 chalky dystrophic toenails.      HTN predates since 2009. Patient's BP has been controlled at home.  Today's BP was initially sl elevated & rechecked ay goal - 138/82. Patient denies any cardiac symptoms as chest pain, palpitations, shortness of breath, dizziness or ankle swelling.     Patient's hyperlipidemia is not controlled with diet and medications. Patient denies myalgias or other medication SE's. Current lipids are at goal: Lab Results  Component Value Date   CHOL 180 04/22/2018   HDL 50 04/22/2018   LDLCALC 109 (H) 04/22/2018   TRIG 103 04/22/2018   CHOLHDL 3.6 04/22/2018      Patient has Morbid obesity (BMI 39+) and hx/o prediabetes since  2009  and patient denies reactive hypoglycemic symptoms, visual blurring, diabetic polys or paresthesias. Current A1c is not at goal: Lab Results  Component Value Date   HGBA1C 6.1 (H) 04/22/2018       Finally, patient has history of Vitamin D Deficiency ("32" / 2009)  and current vitamin D is at goal: Lab Results  Component Value Date   VD25OH 71 04/22/2018   Current Outpatient Medications on File Prior to Visit  Medication Sig  . aspirin 81 MG tablet Take 162 mg by mouth daily.   .  bisoprolol-hydrochlorothiazide (ZIAC) 10-6.25 MG tablet TAKE 1 TABLET BY MOUTH ONCE DAILY FOR BLOOD PRESSURE  . Cholecalciferol (VITAMIN D3) 5000 UNITS CAPS Take 5,000 Units by mouth daily.  Marland Kitchen losartan (COZAAR) 100 MG tablet TAKE 1 TABLET BY [facility] DAILY  . omeprazole (PRILOSEC) 40 MG capsule TAKE 1 CAPSULE BY MOUTH 2 TIMES A DAY.  . vitamin B-12 (CYANOCOBALAMIN) 50 MCG tablet Take 50 mcg by mouth daily.   No current facility-administered medications on file prior to visit.    Allergies  Allergen Reactions  . Ace Inhibitors Cough  . Citalopram Nausea And Vomiting   Past Medical History:  Diagnosis Date  . GERD (gastroesophageal reflux disease)   . Hemorrhoids   . History of Helicobacter pylori infection   . HTN (hypertension)   . Hyperlipemia    pt denies 08/08/12  . Obesity   . Peripheral vascular disease ([place]) 9/14   DVT  left lower leg  . Prostate cancer ([place])   . Vitamin D deficiency    Health Maintenance  Topic Date Due  . Hepatitis C Screening  1950-03-09  . FOOT EXAM  03/18/1959  . OPHTHALMOLOGY EXAM  03/18/1959  . PNA vac Low Risk Adult (1 of 2 - PCV13) 03/17/2014  . Fecal DNA (Cologuard)  12/22/2016  . INFLUENZA VACCINE  10/03/2017  . HEMOGLOBIN A1C  10/21/2018  . TETANUS/TDAP  12/02/2018   Immunization History  Administered Date(s) Administered  . Influenza Split 12/23/2013  . Influenza, High Dose Seasonal PF 11/25/2014, 03/20/2017  . Influenza, Seasonal, Injecte, Preservative Fre 01/17/2016  . Pneumococcal Polysaccharide-23 12/01/2008  . Tdap 12/01/2008   Cologard 12/22/2013 - Negative - recc 3 yr f/u - Overdue  Past Surgical History:  Procedure Laterality Date  . CYSTOSCOPY/RETROGRADE/URETEROSCOPY Left 01/02/2013   Procedure: CYSTOSCOPY/LEFT URETERAL WASHINGS/LEFT RETROGRADE PYELOGRAM/ LEFT URETEROSCOPY/URETERAL BIOPSY. BLADDER BIOPSY;  Surgeon: Ardis Hughs, MD;  Location: [facility];  Service: Urology;  Laterality: Left;  With STENT  . HEMORRHOID  SURGERY     age 15  . TONSILLECTOMY     Family History  Problem Relation Age of Onset  . Diabetes Father   . Heart disease Father   . Cancer Mother        Bladder   Social History   Socioeconomic History  . Marital status: Married    Spouse name: Altha Harm  . Number of children: 1 daughter - No Grandchildren  Occupational History  . Occupation: Occupational hygienist: [employer]  Tobacco Use  . Smoking status: Never Smoker  . Smokeless tobacco: Never Used  Substance and Sexual Activity  . Alcohol use: No  . Drug use: No  . Sexual activity: Yes    ROS Constitutional: Denies fever, chills, weight loss/gain, headaches, insomnia,  night sweats or change in appetite. Does c/o fatigue. Eyes: Denies redness, blurred vision, diplopia, discharge, itchy or watery eyes.  ENT: Denies discharge, congestion, post nasal drip, epistaxis, sore throat, earache, hearing loss, dental pain, Tinnitus, Vertigo, Sinus pain or snoring.  Cardio: Denies chest pain, palpitations, irregular heartbeat, syncope, dyspnea, diaphoresis, orthopnea, PND, claudication or edema Respiratory: denies cough, dyspnea, DOE, pleurisy, hoarseness, laryngitis or wheezing.  Gastrointestinal: Denies dysphagia, heartburn, reflux, water brash, pain, cramps, nausea, vomiting, bloating, diarrhea, constipation, hematemesis, melena, hematochezia, jaundice or hemorrhoids Genitourinary: Denies dysuria, frequency, discharge, hematuria or flank pain. Has urgency, nocturia x 2-3 & occasional hesitancy. Musculoskeletal: Denies arthralgia, myalgia, stiffness, Jt. Swelling, pain, limp or strain/sprain. Denies Falls. Skin: Denies puritis, rash, hives, warts, acne, eczema or change in skin lesion Neuro: No weakness, tremor, incoordination, spasms, paresthesia or pain Psychiatric: Denies confusion, memory loss or sensory loss. Denies Depression. Endocrine: Denies change in weight, skin, hair change, nocturia, and paresthesia,  diabetic polys, visual blurring or hyper / hypo glycemic episodes.  Heme/Lymph: No excessive bleeding, bruising or enlarged lymph nodes.  Physical Exam  BP 138/82   Pulse 72   Temp 97.7 F (36.5 C)   Resp 18   Ht 5\' 11"  (1.803 m)   Wt 281 lb 4.8 oz (127.6 kg)   BMI 39.23 kg/m   General Appearance: Over nourished and well groomed and in no apparent distress.  Eyes: PERRLA, EOMs, conjunctiva no swelling or erythema, normal fundi and vessels. Sinuses: No frontal/maxillary tenderness ENT/Mouth: EACs patent / TMs  nl. Nares clear without erythema, swelling, mucoid exudates. Oral hygiene is good. No erythema, swelling, or exudate. Tongue normal, non-obstructing. Tonsils not swollen or erythematous. Hearing normal.  Neck: Supple, thyroid not palpable. No bruits, nodes or JVD. Respiratory: Respiratory effort normal.  BS equal and clear bilateral without rales, rhonci, wheezing or stridor. Cardio: Heart sounds are normal with regular rate and rhythm and no murmurs, rubs or gallops. Peripheral pulses are normal and equal bilaterally without edema. No aortic or femoral bruits. Chest: symmetric with normal excursions and percussion.  Abdomen: Soft, with Nl bowel sounds. Nontender, no guarding, rebound, hernias, masses,  or organomegaly.  Lymphatics: Non tender without lymphadenopathy.  Musculoskeletal: Full ROM all peripheral extremities, joint stability, 5/5 strength, and normal gait. Skin: Warm and dry without rashes, lesions, cyanosis, clubbing or  ecchymosis. All #10 toenails chalky, discolored yellowish & dystrophic. Neuro: Cranial nerves intact, reflexes equal bilaterally. Normal muscle tone, no cerebellar symptoms. Sensation intact.  Pysch: Alert and oriented X 3 with normal affect, insight and judgment appropriate.   Assessment and Plan  1. Annual Preventative/Screening Exam   2. Essential hypertension  - EKG 12-Lead - [place], RETROPERITNL ABD,  LTD - Urinalysis, Routine w reflex  microscopic - Microalbumin / creatinine urine ratio - CBC with Differential/Platelet - COMPLETE METABOLIC PANEL WITH GFR - Magnesium - TSH  3. Hyperlipidemia, mixed  - EKG 12-Lead - [place], RETROPERITNL ABD,  LTD - Lipid panel - TSH  4. Abnormal glucose  - EKG 12-Lead - [place], RETROPERITNL ABD,  LTD - Hemoglobin A1c - Insulin, random  5. Vitamin D deficiency  - VITAMIN D 25 Hydroxyl  6. Prediabetes  - EKG 12-Lead - [place], RETROPERITNL ABD,  LTD - Hemoglobin A1c - Insulin, random  7. CKD Stage II (GFR 64 ml/min)  - Urinalysis, Routine w reflex microscopic - Microalbumin / creatinine urine ratio - COMPLETE METABOLIC PANEL WITH GFR  8. Gastroesophageal reflux disease  - CBC with Differential/Platelet  9. BPH with obstruction/lower urinary tract symptoms  - PSA  10. History of prostate cancer  - PSA  11. Morbid Obesity (BMI 39+)   12. Screening for colorectal cancer  - POC Hemoccult Bld/Stl ure  13. Screening for ischemic heart disease  - EKG 12-Lead  14. FHx: heart disease  - EKG 12-Lead - [place], RETROPERITNL ABD,  LTD  15. Screening for AAA (aortic abdominal aneurysm)  - [place], RETROPERITNL ABD,  LTD  16. Prostate cancer screening  - PSA  17. Medication management  - Urinalysis, Routine w reflex microscopic - Microalbumin / creatinine urine ratio - CBC with Differential/Platelet - COMPLETE METABOLIC PANEL WITH GFR - Magnesium - Lipid panel - TSH - Hemoglobin A1c - Insulin, random - VITAMIN D 25 Hydroxyl       Patient was counseled in prudent diet, weight control to achieve/maintain BMI less than 25, BP monitoring, regular exercise and medications as discussed.  Given Rx for Lamisil with recommenjdation to recheck liver profile in 6 weeks. Discussed med effects and SE's. Routine screening labs and tests as requested with regular follow-up as recommended. Over 40 minutes of exam, counseling, chart review and high complex critical decision making was  performed

## 2018-04-22 ENCOUNTER — Ambulatory Visit: Payer: BLUE CROSS/BLUE SHIELD | Admitting: Internal Medicine

## 2018-04-22 VITALS — BP 138/82 | HR 72 | Temp 97.7°F | Resp 18 | Ht 71.0 in | Wt 281.3 lb

## 2018-04-22 DIAGNOSIS — N182 Chronic kidney disease, stage 2 (mild): Secondary | ICD-10-CM

## 2018-04-22 DIAGNOSIS — Z0001 Encounter for general adult medical examination with abnormal findings: Secondary | ICD-10-CM

## 2018-04-22 DIAGNOSIS — Z1211 Encounter for screening for malignant neoplasm of colon: Secondary | ICD-10-CM

## 2018-04-22 DIAGNOSIS — R7309 Other abnormal glucose: Secondary | ICD-10-CM

## 2018-04-22 DIAGNOSIS — Z125 Encounter for screening for malignant neoplasm of prostate: Secondary | ICD-10-CM

## 2018-04-22 DIAGNOSIS — K219 Gastro-esophageal reflux disease without esophagitis: Secondary | ICD-10-CM

## 2018-04-22 DIAGNOSIS — I10 Essential (primary) hypertension: Secondary | ICD-10-CM | POA: Diagnosis not present

## 2018-04-22 DIAGNOSIS — Z136 Encounter for screening for cardiovascular disorders: Secondary | ICD-10-CM | POA: Diagnosis not present

## 2018-04-22 DIAGNOSIS — E782 Mixed hyperlipidemia: Secondary | ICD-10-CM

## 2018-04-22 DIAGNOSIS — Z79899 Other long term (current) drug therapy: Secondary | ICD-10-CM

## 2018-04-22 DIAGNOSIS — B351 Tinea unguium: Secondary | ICD-10-CM

## 2018-04-22 DIAGNOSIS — N138 Other obstructive and reflux uropathy: Secondary | ICD-10-CM

## 2018-04-22 DIAGNOSIS — Z0000 Encounter for general adult medical examination without abnormal findings: Secondary | ICD-10-CM | POA: Diagnosis not present

## 2018-04-22 DIAGNOSIS — Z1329 Encounter for screening for other suspected endocrine disorder: Secondary | ICD-10-CM | POA: Diagnosis not present

## 2018-04-22 DIAGNOSIS — R7303 Prediabetes: Secondary | ICD-10-CM

## 2018-04-22 DIAGNOSIS — Z8546 Personal history of malignant neoplasm of prostate: Secondary | ICD-10-CM

## 2018-04-22 DIAGNOSIS — Z1389 Encounter for screening for other disorder: Secondary | ICD-10-CM | POA: Diagnosis not present

## 2018-04-22 DIAGNOSIS — N401 Enlarged prostate with lower urinary tract symptoms: Secondary | ICD-10-CM

## 2018-04-22 DIAGNOSIS — E559 Vitamin D deficiency, unspecified: Secondary | ICD-10-CM | POA: Diagnosis not present

## 2018-04-22 DIAGNOSIS — Z13220 Encounter for screening for lipoid disorders: Secondary | ICD-10-CM

## 2018-04-22 DIAGNOSIS — Z131 Encounter for screening for diabetes mellitus: Secondary | ICD-10-CM | POA: Diagnosis not present

## 2018-04-22 DIAGNOSIS — Z6839 Body mass index (BMI) 39.0-39.9, adult: Secondary | ICD-10-CM

## 2018-04-22 DIAGNOSIS — Z1212 Encounter for screening for malignant neoplasm of rectum: Secondary | ICD-10-CM

## 2018-04-22 DIAGNOSIS — Z8249 Family history of ischemic heart disease and other diseases of the circulatory system: Secondary | ICD-10-CM

## 2018-04-23 LAB — CBC WITH DIFFERENTIAL/PLATELET
Absolute Monocytes: 740 cells/uL (ref 200–950)
BASOS PCT: 0.7 %
Basophils Absolute: 52 cells/uL (ref 0–200)
Eosinophils Absolute: 289 cells/uL (ref 15–500)
Eosinophils Relative: 3.9 %
HCT: 46.9 % (ref 38.5–50.0)
Hemoglobin: 16.5 g/dL (ref 13.2–17.1)
Lymphs Abs: 1850 cells/uL (ref 850–3900)
MCH: 31 pg (ref 27.0–33.0)
MCHC: 35.2 g/dL (ref 32.0–36.0)
MCV: 88 fL (ref 80.0–100.0)
MPV: 10.3 fL (ref 7.5–12.5)
Monocytes Relative: 10 %
Neutro Abs: 4470 cells/uL (ref 1500–7800)
Neutrophils Relative %: 60.4 %
Platelets: 255 10*3/uL (ref 140–400)
RBC: 5.33 10*6/uL (ref 4.20–5.80)
RDW: 12.8 % (ref 11.0–15.0)
Total Lymphocyte: 25 %
WBC: 7.4 10*3/uL (ref 3.8–10.8)

## 2018-04-23 LAB — COMPLETE METABOLIC PANEL WITH GFR
AG Ratio: 1.6 (calc) (ref 1.0–2.5)
ALT: 29 U/L (ref 9–46)
AST: 27 U/L (ref 10–35)
Albumin: 4.4 g/dL (ref 3.6–5.1)
Alkaline phosphatase (APISO): 74 U/L (ref 35–144)
BUN: 14 mg/dL (ref 7–25)
CO2: 30 mmol/L (ref 20–32)
Calcium: 9.8 mg/dL (ref 8.6–10.3)
Chloride: 99 mmol/L (ref 98–110)
Creat: 0.92 mg/dL (ref 0.70–1.25)
GFR, Est African American: 98 mL/min/{1.73_m2} (ref >=60)
GFR, Est Non African American: 85 mL/min/{1.73_m2} (ref >=60)
Globulin: 2.7 g/dL (calc) (ref 1.9–3.7)
Glucose, Bld: 98 mg/dL (ref 65–99)
Potassium: 4 mmol/L (ref 3.5–5.3)
Sodium: 138 mmol/L (ref 135–146)
TOTAL PROTEIN: 7.1 g/dL (ref 6.1–8.1)
Total Bilirubin: 0.5 mg/dL (ref 0.2–1.2)

## 2018-04-23 LAB — URINALYSIS, ROUTINE W REFLEX MICROSCOPIC
Bilirubin Urine: NEGATIVE
GLUCOSE, UA: NEGATIVE
Hgb urine dipstick: NEGATIVE
KETONES UR: NEGATIVE
Leukocytes,Ua: NEGATIVE
Nitrite: NEGATIVE
Protein, ur: NEGATIVE
SPECIFIC GRAVITY, URINE: 1.015 (ref 1.001–1.03)

## 2018-04-23 LAB — LIPID PANEL
Cholesterol: 180 mg/dL (ref <200)
HDL: 50 mg/dL (ref >=40)
LDL Cholesterol (Calc): 109 mg/dL (calc) — ABNORMAL HIGH
Non-HDL Cholesterol (Calc): 130 mg/dL (calc) — ABNORMAL HIGH (ref <130)
TRIGLYCERIDES: 103 mg/dL (ref <150)
Total CHOL/HDL Ratio: 3.6 (calc) (ref <5.0)

## 2018-04-23 LAB — TSH: TSH: 1.79 mIU/L (ref 0.40–4.50)

## 2018-04-23 LAB — HEMOGLOBIN A1C
Hgb A1c MFr Bld: 6.1 % of total Hgb — ABNORMAL HIGH (ref <5.7)
Mean Plasma Glucose: 128 (calc)
eAG (mmol/L): 7.1 (calc)

## 2018-04-23 LAB — MAGNESIUM: Magnesium: 1.9 mg/dL (ref 1.5–2.5)

## 2018-04-23 LAB — VITAMIN D 25 HYDROXY (VIT D DEFICIENCY, FRACTURES): Vit D, 25-Hydroxy: 71 ng/mL (ref 30–100)

## 2018-04-23 LAB — MICROALBUMIN / CREATININE URINE RATIO
Creatinine, Urine: 105 mg/dL (ref 20–320)
MICROALB/CREAT RATIO: 7 ug/mg{creat} (ref <30)
Microalb, Ur: 0.7 mg/dL

## 2018-04-23 LAB — INSULIN, RANDOM: Insulin: 8.8 u[IU]/mL

## 2018-04-23 MED ORDER — TERBINAFINE HCL 250 MG PO TABS: ORAL_TABLET | ORAL | 0 refills | Status: DC

## 2018-04-30 ENCOUNTER — Telehealth: Payer: Self-pay | Admitting: *Deleted

## 2018-04-30 NOTE — Telephone Encounter (Signed)
Patient is aware of lab results.

## 2018-05-26 ENCOUNTER — Other Ambulatory Visit: Payer: Self-pay | Admitting: Internal Medicine

## 2018-06-03 ENCOUNTER — Other Ambulatory Visit: Payer: BC Managed Care – PPO

## 2018-06-03 ENCOUNTER — Other Ambulatory Visit: Payer: Self-pay

## 2018-06-03 ENCOUNTER — Other Ambulatory Visit: Payer: Self-pay | Admitting: Internal Medicine

## 2018-06-03 DIAGNOSIS — Z79899 Other long term (current) drug therapy: Secondary | ICD-10-CM

## 2018-06-03 DIAGNOSIS — E782 Mixed hyperlipidemia: Secondary | ICD-10-CM | POA: Diagnosis not present

## 2018-06-03 DIAGNOSIS — B351 Tinea unguium: Secondary | ICD-10-CM

## 2018-06-04 LAB — HEPATIC FUNCTION PANEL
AG Ratio: 1.6 (calc) (ref 1.0–2.5)
ALBUMIN MSPROF: 4.2 g/dL (ref 3.6–5.1)
ALT: 28 U/L (ref 9–46)
AST: 23 U/L (ref 10–35)
Alkaline phosphatase (APISO): 81 U/L (ref 35–144)
Bilirubin, Direct: 0.1 mg/dL (ref 0.0–0.2)
Globulin: 2.7 g/dL (calc) (ref 1.9–3.7)
Indirect Bilirubin: 0.2 mg/dL (calc) (ref 0.2–1.2)
Total Bilirubin: 0.3 mg/dL (ref 0.2–1.2)
Total Protein: 6.9 g/dL (ref 6.1–8.1)

## 2018-07-24 NOTE — Progress Notes (Signed)
FOLLOW UP  Assessment and Plan:   Hypertension Well controlled with current medications  Monitor blood pressure at home; patient to call if consistently greater than 130/80 Continue DASH diet.   Reminder to go to the ER if any CP, SOB, nausea, dizziness, severe HA, changes vision/speech, left arm numbness and tingling and jaw pain.  Cholesterol Currently with mild elevations not on medication; lifestyle emphasized Continue low cholesterol diet and exercise.  Check lipid panel.   Prediabetes Continue diet and exercise.  A1C goal <5.7 discussed Perform daily foot/skin check, notify office of any concerning changes.  Check A1C  Obesity with co morbidities Long discussion about weight loss, diet, and exercise Recommended diet heavy in fruits and veggies and low in animal meats, cheeses, and dairy products, appropriate calorie intake Discussed ideal weight for height, he is working towards weight goal of 220 lb Will follow up in 3 months  Vitamin D Def At goal at last visit; continue supplementation to maintain goal of 70-100 Defer Vit D level  Continue diet and meds as discussed. Further disposition pending results of labs. Discussed med's effects and SE's.   Over 30 minutes of exam, counseling, chart review, and critical decision making was performed.   Future Appointments  Date Time Provider [place]  10/30/2018  2:30 PM Unk Pinto, MD [facility] None  06/04/2019  3:00 PM Unk Pinto, MD [facility] None    ----------------------------------------------------------------------------------------------------------------------  HPI 68 y.o. male  presents for 3 month follow up on hypertension, cholesterol, prediabetes, morbid obesity and vitamin D deficiency.   Patient was diagnosed with Prostate Cancer in 2018 and was treated by 40 Radiation sessions July 5 thru Aug 29 and is followed by Dr. Louis Meckel by q43m PSAs which have been low/stable.  Last 0.033 per  patient.    he has a diagnosis of GERD which is currently managed by omeprazole 40 mg daily. he reports symptoms is currently well controlled, and denies breakthrough reflux, burning in chest, hoarseness or cough.     BMI is Body mass index is 38.49 kg/m., he has been working on diet, he stopped drinking sweet tea on January 3rd and hasn't had since. Cutting down on sugary foods in general. Working on portions, splitting meals with wife. Weight goal 220 lb.  Wt Readings from Last 3 Encounters:  07/29/18 276 lb (125.2 kg)  04/22/18 281 lb 4.8 oz (127.6 kg)  01/09/18 278 lb (126.1 kg)   His blood pressure has been controlled at home, today their BP is BP: 116/80  He does not workout, but walks a lot at work all day. He denies chest pain, shortness of breath, dizziness.   He is not on cholesterol medication and denies myalgias. His cholesterol is not at goal. The cholesterol last visit was:   Lab Results  Component Value Date   CHOL 180 04/22/2018   HDL 50 04/22/2018   LDLCALC 109 (H) 04/22/2018   TRIG 103 04/22/2018   CHOLHDL 3.6 04/22/2018    He has been working on diet for prediabetes, and denies foot ulcerations, increased appetite, nausea, paresthesia of the feet, polydipsia, polyuria, visual disturbances and vomiting. Last A1C in the office was:  Lab Results  Component Value Date   HGBA1C 6.1 (H) 04/22/2018   Patient is on Vitamin D supplement.   Lab Results  Component Value Date   VD25OH 82 04/22/2018       Current Medications:  Current Outpatient Medications on File Prior to Visit  Medication Sig  . aspirin  81 MG tablet Take 162 mg by mouth daily.   . bisoprolol-hydrochlorothiazide (ZIAC) 10-6.25 MG tablet TAKE 1 TABLET BY MOUTH ONCE DAILY FOR BLOOD PRESSURE  . Cholecalciferol (VITAMIN D3) 5000 UNITS CAPS Take 5,000 Units by mouth daily.  Marland Kitchen losartan (COZAAR) 100 MG tablet TAKE 1 TABLET BY [facility] DAILY (Patient taking differently: 50 mg. )  . omeprazole (PRILOSEC) 40 MG  capsule TAKE 1 CAPSULE BY MOUTH 2 TIMES A DAY.  Marland Kitchen terbinafine (LAMISIL) 250 MG tablet Take 1 tablet daily for Toenail Fungus  . vitamin B-12 (CYANOCOBALAMIN) 50 MCG tablet Take 50 mcg by mouth daily.   No current facility-administered medications on file prior to visit.      Allergies:  Allergies  Allergen Reactions  . Ace Inhibitors Other (See Comments)    Cough   . Citalopram Nausea And Vomiting     Medical History:  Past Medical History:  Diagnosis Date  . GERD (gastroesophageal reflux disease)   . Hemorrhoids   . History of Helicobacter pylori infection   . HTN (hypertension)   . Hyperlipemia    pt denies 08/08/12  . Obesity   . Peripheral vascular disease ([place]) 9/14   DVT  left lower leg  . Prostate cancer ([place])   . Vitamin D deficiency    Family history- Reviewed and unchanged Social history- Reviewed and unchanged   Review of Systems:  Review of Systems  Constitutional: Negative for malaise/fatigue and weight loss.  HENT: Negative for hearing loss and tinnitus.   Eyes: Negative for blurred vision and double vision.  Respiratory: Negative for cough, shortness of breath and wheezing.   Cardiovascular: Negative for chest pain, palpitations, orthopnea, claudication and leg swelling.  Gastrointestinal: Negative for abdominal pain, blood in stool, constipation, diarrhea, heartburn, melena, nausea and vomiting.  Genitourinary: Negative.   Musculoskeletal: Negative for joint pain and myalgias.  Skin: Negative for rash.  Neurological: Negative for dizziness, tingling, sensory change, weakness and headaches.  Endo/Heme/Allergies: Negative for polydipsia.  Psychiatric/Behavioral: Negative.   All other systems reviewed and are negative.   Physical Exam: BP 116/80   Pulse 69   Temp (!) 97.5 F (36.4 C)   Wt 276 lb (125.2 kg)   SpO2 97%   BMI 38.49 kg/m  Wt Readings from Last 3 Encounters:  07/29/18 276 lb (125.2 kg)  04/22/18 281 lb 4.8 oz (127.6 kg)  01/09/18  278 lb (126.1 kg)   General Appearance: Well nourished, in no apparent distress. Eyes: PERRLA, EOMs, conjunctiva no swelling or erythema Sinuses: No Frontal/maxillary tenderness ENT/Mouth: Ext aud canals clear, TMs without erythema, bulging. No erythema, swelling, or exudate on post pharynx.  Tonsils not swollen or erythematous. Hearing normal.  Neck: Supple, thyroid normal.  Respiratory: Respiratory effort normal, BS equal bilaterally without rales, rhonchi, wheezing or stridor.  Cardio: RRR with no MRGs. Brisk peripheral pulses without edema.  Abdomen: Soft, + BS.  Non tender, no guarding, rebound, hernias, masses. Lymphatics: Non tender without lymphadenopathy.  Musculoskeletal: Full ROM, 5/5 strength, Normal gait Skin: Warm, dry without rashes, lesions, ecchymosis.  Neuro: Cranial nerves intact. No cerebellar symptoms.  Psych: Awake and oriented X 3, normal affect, Insight and Judgment appropriate.    Izora Ribas, NP 2:56 PM [facility]

## 2018-07-29 ENCOUNTER — Encounter: Payer: Self-pay | Admitting: Adult Health

## 2018-07-29 ENCOUNTER — Other Ambulatory Visit: Payer: Self-pay

## 2018-07-29 ENCOUNTER — Ambulatory Visit: Payer: BLUE CROSS/BLUE SHIELD | Admitting: Adult Health

## 2018-07-29 VITALS — BP 116/80 | HR 69 | Temp 97.5°F | Wt 276.0 lb

## 2018-07-29 DIAGNOSIS — I10 Essential (primary) hypertension: Secondary | ICD-10-CM | POA: Diagnosis not present

## 2018-07-29 DIAGNOSIS — E782 Mixed hyperlipidemia: Secondary | ICD-10-CM | POA: Diagnosis not present

## 2018-07-29 DIAGNOSIS — N182 Chronic kidney disease, stage 2 (mild): Secondary | ICD-10-CM

## 2018-07-29 DIAGNOSIS — Z79899 Other long term (current) drug therapy: Secondary | ICD-10-CM

## 2018-07-29 DIAGNOSIS — R7309 Other abnormal glucose: Secondary | ICD-10-CM | POA: Diagnosis not present

## 2018-07-29 DIAGNOSIS — E559 Vitamin D deficiency, unspecified: Secondary | ICD-10-CM

## 2018-07-29 DIAGNOSIS — K219 Gastro-esophageal reflux disease without esophagitis: Secondary | ICD-10-CM | POA: Diagnosis not present

## 2018-07-29 MED ORDER — LOSARTAN POTASSIUM 50 MG PO TABS: ORAL_TABLET | ORAL | 1 refills | Status: DC

## 2018-07-29 NOTE — Patient Instructions (Signed)
Goals    . Exercise 150 min/wk Moderate Activity    . HEMOGLOBIN A1C < 5.7    . Weight (lb) < 220 lb (99.8 kg)       My easy weight loss tips:   Drink 1/2 your body weight in fluid ounces of water daily; drink a tall glass of water 30 min before meals  Don't eat until you're stuffed- listen to your stomach and eat until you are 80% full   Try eating off of a salad plate; wait 10 min after finishing before going back for seconds  Start by eating the vegetables on your plate; aim for 50% of your meals to be fruits or vegetables  Then eat your protein - lean meats (grass fed if possible), fish, beans, nuts in moderation  Eat your carbs/starch last ONLY if you still are hungry. If you can, stop before finishing it all  Avoid sugar and flour - the closer it looks to it's original form in nature, typically the better it is for you  Splurge in moderation - "assign" days when you get to splurge and have the "bad stuff" - I like to follow a 80% - 20% plan- "good" choices 80 % of the time, "bad" choices in moderation 20% of the time  Simple equation is: Calories out > calories in = weight loss - even if you eat the bad stuff, if you limit portions, you will still lose weight    Prediabetes Eating Plan Prediabetes is a condition that causes blood sugar (glucose) levels to be higher than normal. This increases the risk for developing diabetes. In order to prevent diabetes from developing, your health care provider may recommend a diet and other lifestyle changes to help you:  Control your blood glucose levels.  Improve your cholesterol levels.  Manage your blood pressure. Your health care provider may recommend working with a diet and nutrition specialist (dietitian) to make a meal plan that is best for you. What are tips for following this plan? Lifestyle  Set weight loss goals with the help of your health care team. It is recommended that most people with prediabetes lose 7% of their  current body weight.  Exercise for at least 30 minutes at least 5 days a week.  Attend a support group or seek ongoing support from a mental health counselor.  Take over-the-counter and prescription medicines only as told by your health care provider. Reading food labels  Read food labels to check the amount of fat, salt (sodium), and sugar in prepackaged foods. Avoid foods that have: ? Saturated fats. ? Trans fats. ? Added sugars.  Avoid foods that have more than 300 milligrams (mg) of sodium per serving. Limit your daily sodium intake to less than 2,300 mg each day. Shopping  Avoid buying pre-made and processed foods. Cooking  Cook with olive oil. Do not use butter, lard, or ghee.  Bake, broil, grill, or boil foods. Avoid frying. Meal planning   Work with your dietitian to develop an eating plan that is right for you. This may include: ? Tracking how many calories you take in. Use a food diary, notebook, or mobile application to track what you eat at each meal. ? Using the glycemic index (GI) to plan your meals. The index tells you how quickly a food will raise your blood glucose. Choose low-GI foods. These foods take a longer time to raise blood glucose.  Consider following a Mediterranean diet. This diet includes: ? Several servings each  day of fresh fruits and vegetables. ? Eating fish at least twice a week. ? Several servings each day of whole grains, beans, nuts, and seeds. ? Using olive oil instead of other fats. ? Moderate alcohol consumption. ? Eating small amounts of red meat and whole-fat dairy.  If you have high blood pressure, you may need to limit your sodium intake or follow a diet such as the DASH eating plan. DASH is an eating plan that aims to lower high blood pressure. What foods are recommended? The items listed below may not be a complete list. Talk with your dietitian about what dietary choices are best for you. Grains Whole grains, such as whole-wheat  or whole-grain breads, crackers, cereals, and pasta. Unsweetened oatmeal. Bulgur. Barley. Quinoa. Brown rice. Corn or whole-wheat flour tortillas or taco shells. Vegetables Lettuce. Spinach. Peas. Beets. Cauliflower. Cabbage. Broccoli. Carrots. Tomatoes. Squash. Eggplant. Herbs. Peppers. Onions. Cucumbers. Brussels sprouts. Fruits Berries. Bananas. Apples. Oranges. Grapes. Papaya. Mango. Pomegranate. Kiwi. Grapefruit. Cherries. Meats and other protein foods Seafood. Poultry without skin. Lean cuts of pork and beef. Tofu. Eggs. Nuts. Beans. Dairy Low-fat or fat-free dairy products, such as yogurt, cottage cheese, and cheese. Beverages Water. Tea. Coffee. Sugar-free or diet soda. Seltzer water. Lowfat or no-fat milk. Milk alternatives, such as soy or almond milk. Fats and oils Olive oil. Canola oil. Sunflower oil. Grapeseed oil. Avocado. Walnuts. Sweets and desserts Sugar-free or low-fat pudding. Sugar-free or low-fat ice cream and other frozen treats. Seasoning and other foods Herbs. Sodium-free spices. Mustard. Relish. Low-fat, low-sugar ketchup. Low-fat, low-sugar barbecue sauce. Low-fat or fat-free mayonnaise. What foods are not recommended? The items listed below may not be a complete list. Talk with your dietitian about what dietary choices are best for you. Grains Refined white flour and flour products, such as bread, pasta, snack foods, and cereals. Vegetables Canned vegetables. Frozen vegetables with butter or cream sauce. Fruits Fruits canned with syrup. Meats and other protein foods Fatty cuts of meat. Poultry with skin. Breaded or fried meat. Processed meats. Dairy Full-fat yogurt, cheese, or milk. Beverages Sweetened drinks, such as sweet iced tea and soda. Fats and oils Butter. Lard. Ghee. Sweets and desserts Baked goods, such as cake, cupcakes, pastries, cookies, and cheesecake. Seasoning and other foods Spice mixes with added salt. Ketchup. Barbecue sauce.  Mayonnaise. Summary  To prevent diabetes from developing, you may need to make diet and other lifestyle changes to help control blood sugar, improve cholesterol levels, and manage your blood pressure.  Set weight loss goals with the help of your health care team. It is recommended that most people with prediabetes lose 7 percent of their current body weight.  Consider following a Mediterranean diet that includes plenty of fresh fruits and vegetables, whole grains, beans, nuts, seeds, fish, lean meat, low-fat dairy, and healthy oils. This information is not intended to replace advice given to you by your health care provider. Make sure you discuss any questions you have with your health care provider. Document Released: 07/06/2014 Document Revised: 04/25/2016 Document Reviewed: 04/25/2016 Elsevier Interactive Patient Education  2019 [employer].

## 2018-07-30 LAB — CBC WITH DIFFERENTIAL/PLATELET
Absolute Monocytes: 828 cells/uL (ref 200–950)
Basophils Absolute: 38 cells/uL (ref 0–200)
Basophils Relative: 0.5 %
Eosinophils Absolute: 312 cells/uL (ref 15–500)
Eosinophils Relative: 4.1 %
HCT: 46.7 % (ref 38.5–50.0)
Hemoglobin: 16.1 g/dL (ref 13.2–17.1)
Lymphs Abs: 1657 cells/uL (ref 850–3900)
MCH: 30.6 pg (ref 27.0–33.0)
MCHC: 34.5 g/dL (ref 32.0–36.0)
MCV: 88.8 fL (ref 80.0–100.0)
MPV: 10.3 fL (ref 7.5–12.5)
Monocytes Relative: 10.9 %
Neutro Abs: 4765 cells/uL (ref 1500–7800)
Neutrophils Relative %: 62.7 %
Platelets: 234 10*3/uL (ref 140–400)
RBC: 5.26 10*6/uL (ref 4.20–5.80)
RDW: 12.9 % (ref 11.0–15.0)
Total Lymphocyte: 21.8 %
WBC: 7.6 10*3/uL (ref 3.8–10.8)

## 2018-07-30 LAB — COMPLETE METABOLIC PANEL WITH GFR
AG Ratio: 1.7 (calc) (ref 1.0–2.5)
ALT: 30 U/L (ref 9–46)
AST: 25 U/L (ref 10–35)
Albumin: 4.3 g/dL (ref 3.6–5.1)
Alkaline phosphatase (APISO): 74 U/L (ref 35–144)
BUN: 16 mg/dL (ref 7–25)
CO2: 30 mmol/L (ref 20–32)
Calcium: 9.9 mg/dL (ref 8.6–10.3)
Chloride: 99 mmol/L (ref 98–110)
Creat: 1.16 mg/dL (ref 0.70–1.25)
GFR, Est African American: 74 mL/min/{1.73_m2} (ref >=60)
GFR, Est Non African American: 64 mL/min/{1.73_m2} (ref >=60)
Globulin: 2.5 g/dL (calc) (ref 1.9–3.7)
Glucose, Bld: 101 mg/dL — ABNORMAL HIGH (ref 65–99)
Potassium: 4 mmol/L (ref 3.5–5.3)
Sodium: 138 mmol/L (ref 135–146)
Total Bilirubin: 0.6 mg/dL (ref 0.2–1.2)
Total Protein: 6.8 g/dL (ref 6.1–8.1)

## 2018-07-30 LAB — LIPID PANEL
Cholesterol: 193 mg/dL (ref <200)
HDL: 43 mg/dL (ref >=40)
LDL Cholesterol (Calc): 128 mg/dL (calc) — ABNORMAL HIGH
Non-HDL Cholesterol (Calc): 150 mg/dL (calc) — ABNORMAL HIGH (ref <130)
Total CHOL/HDL Ratio: 4.5 (calc) (ref <5.0)
Triglycerides: 117 mg/dL (ref <150)

## 2018-07-30 LAB — HEMOGLOBIN A1C
Hgb A1c MFr Bld: 5.9 % of total Hgb — ABNORMAL HIGH (ref <5.7)
Mean Plasma Glucose: 123 (calc)
eAG (mmol/L): 6.8 (calc)

## 2018-07-30 LAB — MAGNESIUM: Magnesium: 1.9 mg/dL (ref 1.5–2.5)

## 2018-07-30 LAB — TSH: TSH: 2.07 mIU/L (ref 0.40–4.50)

## 2018-10-23 ENCOUNTER — Other Ambulatory Visit: Payer: Self-pay | Admitting: *Deleted

## 2018-10-23 MED ORDER — LOSARTAN POTASSIUM 50 MG PO TABS: ORAL_TABLET | ORAL | 1 refills | Status: DC

## 2018-10-30 ENCOUNTER — Ambulatory Visit: Payer: BC Managed Care – PPO | Admitting: Internal Medicine

## 2018-10-30 ENCOUNTER — Other Ambulatory Visit: Payer: Self-pay

## 2018-10-30 ENCOUNTER — Encounter: Payer: Self-pay | Admitting: Internal Medicine

## 2018-10-30 VITALS — BP 130/86 | HR 68 | Temp 97.2°F | Resp 18 | Ht 71.0 in | Wt 276.2 lb

## 2018-10-30 DIAGNOSIS — I10 Essential (primary) hypertension: Secondary | ICD-10-CM

## 2018-10-30 DIAGNOSIS — R350 Frequency of micturition: Secondary | ICD-10-CM | POA: Diagnosis not present

## 2018-10-30 DIAGNOSIS — Z125 Encounter for screening for malignant neoplasm of prostate: Secondary | ICD-10-CM | POA: Diagnosis not present

## 2018-10-30 DIAGNOSIS — R7303 Prediabetes: Secondary | ICD-10-CM

## 2018-10-30 DIAGNOSIS — E782 Mixed hyperlipidemia: Secondary | ICD-10-CM

## 2018-10-30 DIAGNOSIS — E559 Vitamin D deficiency, unspecified: Secondary | ICD-10-CM | POA: Diagnosis not present

## 2018-10-30 DIAGNOSIS — R7309 Other abnormal glucose: Secondary | ICD-10-CM

## 2018-10-30 DIAGNOSIS — Z79899 Other long term (current) drug therapy: Secondary | ICD-10-CM | POA: Diagnosis not present

## 2018-10-30 DIAGNOSIS — N401 Enlarged prostate with lower urinary tract symptoms: Secondary | ICD-10-CM | POA: Diagnosis not present

## 2018-10-30 DIAGNOSIS — Z8546 Personal history of malignant neoplasm of prostate: Secondary | ICD-10-CM

## 2018-10-30 NOTE — Progress Notes (Signed)
History of Present Illness:      This very nice 68 y.o. MWM presents for 6 month follow up with HTN, HLD, Pre-Diabetes and Vitamin D Deficiency. Patient's GERD is controlled with his meds. In 2018, he was treated for Prostate Ca with Radiation Tx x 40 sessions.       Patient is treated for HTN (2009)  & BP has been controlled at home. Todays BP is at goal - 130/86. Patient has had no complaints of any cardiac type chest pain, palpitations, dyspnea / orthopnea / PND, dizziness, claudication, or dependent edema.      Hyperlipidemia is controlled with diet & meds. Patient denies myalgias or other med SEs. Last Lipids were  Lab Results  Component Value Date   CHOL 193 07/29/2018   HDL 43 07/29/2018   LDLCALC 128 (H) 07/29/2018   TRIG 117 07/29/2018   CHOLHDL 4.5 07/29/2018       Also, the patient has Morbid Obesity (BMI 38+) and history of PreDiabetes (2009)  and has had no symptoms of reactive hypoglycemia, diabetic polys, paresthesias or visual blurring.  Last A1c was not at goal: Lab Results  Component Value Date   HGBA1C 5.9 (H) 07/29/2018       Further, the patient also has history of Vitamin D Deficiency("32" / 2009) and supplements vitamin D without any suspected side-effects. Last vitamin D was at goal: Lab Results  Component Value Date   VD25OH 71 04/22/2018   Current Outpatient Medications on File Prior to Visit  Medication Sig   aspirin 81 MG tablet Take 162 mg by mouth daily.    bisoprolol-hydrochlorothiazide (ZIAC) 10-6.25 MG tablet TAKE 1 TABLET BY MOUTH ONCE DAILY FOR BLOOD PRESSURE   Cholecalciferol (VITAMIN D3) 5000 UNITS CAPS Take 5,000 Units by mouth daily.   losartan (COZAAR) 50 MG tablet TAKE 1 TABLE DAILY FOR BLOOD PRESSURE.   omeprazole (PRILOSEC) 40 MG capsule TAKE 1 CAPSULE BY MOUTH 2 TIMES A DAY.   terbinafine (LAMISIL) 250 MG tablet Take 1 tablet daily for Toenail Fungus   vitamin B-12 (CYANOCOBALAMIN) 50 MCG tablet Take 50 mcg by mouth.  Takes 1 tablet every other day.   No current facility-administered medications on file prior to visit.    Allergies  Allergen Reactions   Ace Inhibitors Other (See Comments)    Cough    Citalopram Nausea And Vomiting   PMHx:   Past Medical History:  Diagnosis Date   GERD (gastroesophageal reflux disease)    Hemorrhoids    History of Helicobacter pylori infection    HTN (hypertension)    Hyperlipemia    pt denies 08/08/12   Obesity    Peripheral vascular disease ([place]) 9/14   DVT  left lower leg   Prostate cancer ([place])    Vitamin D deficiency    Immunization History  Administered Date(s) Administered   Influenza Split 12/23/2013   Influenza, High Dose Seasonal PF 11/25/2014, 03/20/2017   Influenza, Seasonal, Injecte, Preservative Fre 01/17/2016   Pneumococcal Polysaccharide-23 12/01/2008   Tdap 12/01/2008   Past Surgical History:  Procedure Laterality Date   CYSTOSCOPY/RETROGRADE/URETEROSCOPY Left 01/02/2013   Procedure: CYSTOSCOPY/LEFT URETERAL WASHINGS/LEFT RETROGRADE PYELOGRAM/ LEFT URETEROSCOPY/URETERAL BIOPSY. BLADDER BIOPSY;  Surgeon: Ardis Hughs, MD;  Location: [facility];  Service: Urology;  Laterality: Left;  With STENT   HEMORRHOID SURGERY     age 78   TONSILLECTOMY     FHx:    Reviewed / unchanged  SHx:  Reviewed / unchanged   Systems Review:  Constitutional: Denies fever, chills, wt changes, headaches, insomnia, fatigue, night sweats, change in appetite. Eyes: Denies redness, blurred vision, diplopia, discharge, itchy, watery eyes.  ENT: Denies discharge, congestion, post nasal drip, epistaxis, sore throat, earache, hearing loss, dental pain, tinnitus, vertigo, sinus pain, snoring.  CV: Denies chest pain, palpitations, irregular heartbeat, syncope, dyspnea, diaphoresis, orthopnea, PND, claudication or edema. Respiratory: denies cough, dyspnea, DOE, pleurisy, hoarseness, laryngitis, wheezing.  Gastrointestinal: Denies dysphagia,  odynophagia, heartburn, reflux, water brash, abdominal pain or cramps, nausea, vomiting, bloating, diarrhea, constipation, hematemesis, melena, hematochezia  or hemorrhoids. Genitourinary: Denies dysuria, frequency, urgency, nocturia, hesitancy, discharge, hematuria or flank pain. Musculoskeletal: Denies arthralgias, myalgias, stiffness, jt. swelling, pain, limping or strain/sprain.  Skin: Denies pruritus, rash, hives, warts, acne, eczema or change in skin lesion(s). Neuro: No weakness, tremor, incoordination, spasms, paresthesia or pain. Psychiatric: Denies confusion, memory loss or sensory loss. Endo: Denies change in weight, skin or hair change.  Heme/Lymph: No excessive bleeding, bruising or enlarged lymph nodes.  Physical Exam  BP 130/86    Pulse 68    Temp (!) 97.2 F (36.2 C)    Resp 18    Ht 5\' 11"  (1.803 m)    Wt 276 lb 3.2 oz (125.3 kg)    BMI 38.52 kg/m   Appears over nourished, well groomed  and in no distress.  Eyes: PERRLA, EOMs, conjunctiva no swelling or erythema. Sinuses: No frontal/maxillary tenderness ENT/Mouth: EAC's clear, TM's nl w/o erythema, bulging. Nares clear w/o erythema, swelling, exudates. Oropharynx clear without erythema or exudates. Oral hygiene is good. Tongue normal, non obstructing. Hearing intact.  Neck: Supple. Thyroid not palpable. Car 2+/2+ without bruits, nodes or JVD. Chest: Respirations nl with BS clear & equal w/o rales, rhonchi, wheezing or stridor.  Cor: Heart sounds normal w/ regular rate and rhythm without sig. murmurs, gallops, clicks or rubs. Peripheral pulses normal and equal  without edema.  Abdomen: Soft & bowel sounds normal. Non-tender w/o guarding, rebound, hernias, masses or organomegaly.  Lymphatics: Unremarkable.  Musculoskeletal: Full ROM all peripheral extremities, joint stability, 5/5 strength and normal gait.  Skin: Warm, dry without exposed rashes, lesions or ecchymosis apparent.  Neuro: Cranial nerves intact, reflexes equal  bilaterally. Sensory-motor testing grossly intact. Tendon reflexes grossly intact.  Pysch: Alert & oriented x 3.  Insight and judgement nl & appropriate. No ideations.  Assessment and Plan:   1. Essential hypertension  - Continue medication, monitor blood pressure at home.  - Continue DASH diet.  Reminder to go to the ER if any CP,  SOB, nausea, dizziness, severe HA, changes vision/speech.  - CBC with Differential/Platelet - COMPLETE METABOLIC PANEL WITH GFR - Magnesium - TSH  2. Hyperlipidemia, mixed  - Continue diet/meds, exercise,& lifestyle modifications.  - Continue monitor periodic cholesterol/liver & renal functions   - Lipid panel - TSH  3. Abnormal glucose  - Continue diet, exercise  - Lifestyle modifications.  - Monitor appropriate labs.  - Hemoglobin A1c - Insulin, random  4. Vitamin D deficiency  - Continue supplementation.  - VITAMIN D 25 Hydroxyl  5. Prediabetes  - Continue diet, exercise  - Lifestyle modifications.  - Monitor appropriate labs.  - Hemoglobin A1c - Insulin, random  6. History of prostate cancer  - PSA  7. Prostate cancer screening  - PSA  8. Medication management  - CBC with Differential/Platelet - COMPLETE METABOLIC PANEL WITH GFR - Magnesium - Lipid panel - TSH - Hemoglobin A1c - Insulin, random -  VITAMIN D 25 Hydroxyl         Discussed  regular exercise, BP monitoring, weight control to achieve/maintain BMI less than 25 and discussed med and SE's. Recommended labs to assess and monitor clinical status with further disposition pending results of labs.  I discussed the assessment and treatment plan with the patient. The patient was provided an opportunity to ask questions and all were answered. The patient agreed with the plan and demonstrated an understanding of the instructions.  I provided over 30 minutes of exam, counseling, chart review and  complex critical decision making.  Kirtland Bouchard, MD

## 2018-10-30 NOTE — Patient Instructions (Signed)

## 2018-10-31 LAB — CBC WITH DIFFERENTIAL/PLATELET
Absolute Monocytes: 733 cells/uL (ref 200–950)
Basophils Absolute: 47 cells/uL (ref 0–200)
Basophils Relative: 0.6 %
Eosinophils Absolute: 335 cells/uL (ref 15–500)
Eosinophils Relative: 4.3 %
HCT: 48.4 % (ref 38.5–50.0)
Hemoglobin: 16.4 g/dL (ref 13.2–17.1)
Lymphs Abs: 1677 cells/uL (ref 850–3900)
MCH: 31.2 pg (ref 27.0–33.0)
MCHC: 33.9 g/dL (ref 32.0–36.0)
MCV: 92 fL (ref 80.0–100.0)
MPV: 10.2 fL (ref 7.5–12.5)
Monocytes Relative: 9.4 %
Neutro Abs: 5008 cells/uL (ref 1500–7800)
Neutrophils Relative %: 64.2 %
Platelets: 245 10*3/uL (ref 140–400)
RBC: 5.26 10*6/uL (ref 4.20–5.80)
RDW: 12.8 % (ref 11.0–15.0)
Total Lymphocyte: 21.5 %
WBC: 7.8 10*3/uL (ref 3.8–10.8)

## 2018-10-31 LAB — COMPLETE METABOLIC PANEL WITH GFR
AG Ratio: 1.6 (calc) (ref 1.0–2.5)
ALT: 33 U/L (ref 9–46)
AST: 25 U/L (ref 10–35)
Albumin: 4.5 g/dL (ref 3.6–5.1)
Alkaline phosphatase (APISO): 85 U/L (ref 35–144)
BUN/Creatinine Ratio: 11 (calc) (ref 6–22)
BUN: 14 mg/dL (ref 7–25)
CO2: 31 mmol/L (ref 20–32)
Calcium: 10.1 mg/dL (ref 8.6–10.3)
Chloride: 99 mmol/L (ref 98–110)
Creat: 1.27 mg/dL — ABNORMAL HIGH (ref 0.70–1.25)
GFR, Est African American: 66 mL/min/{1.73_m2} (ref >=60)
GFR, Est Non African American: 57 mL/min/{1.73_m2} — ABNORMAL LOW (ref >=60)
Globulin: 2.8 g/dL (calc) (ref 1.9–3.7)
Glucose, Bld: 102 mg/dL — ABNORMAL HIGH (ref 65–99)
Potassium: 4.1 mmol/L (ref 3.5–5.3)
Sodium: 139 mmol/L (ref 135–146)
Total Bilirubin: 0.4 mg/dL (ref 0.2–1.2)
Total Protein: 7.3 g/dL (ref 6.1–8.1)

## 2018-10-31 LAB — HEMOGLOBIN A1C
Hgb A1c MFr Bld: 6 % of total Hgb — ABNORMAL HIGH (ref <5.7)
Mean Plasma Glucose: 126 (calc)
eAG (mmol/L): 7 (calc)

## 2018-10-31 LAB — LIPID PANEL
Cholesterol: 185 mg/dL (ref <200)
HDL: 46 mg/dL (ref >=40)
LDL Cholesterol (Calc): 107 mg/dL (calc) — ABNORMAL HIGH
Non-HDL Cholesterol (Calc): 139 mg/dL (calc) — ABNORMAL HIGH (ref <130)
Total CHOL/HDL Ratio: 4 (calc) (ref <5.0)
Triglycerides: 203 mg/dL — ABNORMAL HIGH (ref <150)

## 2018-10-31 LAB — INSULIN, RANDOM: Insulin: 31 u[IU]/mL — ABNORMAL HIGH

## 2018-10-31 LAB — TSH: TSH: 1.26 mIU/L (ref 0.40–4.50)

## 2018-10-31 LAB — VITAMIN D 25 HYDROXY (VIT D DEFICIENCY, FRACTURES): Vit D, 25-Hydroxy: 72 ng/mL (ref 30–100)

## 2018-10-31 LAB — MAGNESIUM: Magnesium: 2 mg/dL (ref 1.5–2.5)

## 2018-10-31 LAB — PSA: PSA: 0.4 ng/mL (ref <=4.0)

## 2018-11-28 ENCOUNTER — Other Ambulatory Visit: Payer: Self-pay | Admitting: Internal Medicine

## 2019-02-03 NOTE — Progress Notes (Signed)
FOLLOW UP  Assessment and Plan:   Hypertension Well controlled with current medications  Monitor blood pressure at home; patient to call if consistently greater than 130/80 Continue DASH diet.   Reminder to go to the ER if any CP, SOB, nausea, dizziness, severe HA, changes vision/speech, left arm numbness and tingling and jaw pain.  Cholesterol Currently with mild elevations not on medication; lifestyle emphasized Continue low cholesterol diet and exercise.  Check lipid panel.   Prediabetes Continue diet and exercise.  A1C goal <5.7 discussed Perform daily foot/skin check, notify office of any concerning changes.  Check A1C  Morbid besity: BMI 38 with co morbidities (htn, hyperlipidemia, prediabetes) Long discussion about weight loss, diet, and exercise Recommended diet heavy in fruits and veggies and low in animal meats, cheeses, and dairy products, appropriate calorie intake Discussed ideal weight for height, he is working towards weight goal of 220 lb Will follow up in 3 months  Vitamin D Def At goal at last visit; continue supplementation to maintain goal of 70-100 Defer Vit D level  Prostate cancer [facility]) S/p radiation in 2018, active surveillance with Dr. Louis Meckel q3m  Need for influenza vaccine - High dose quadrivalent influenza vaccine administered  Bilateral cerumen impact - stop using Qtips, irrigation used in the office without complications, use OTC drops/oil at home to prevent reoccurence   Continue diet and meds as discussed. Further disposition pending results of labs. Discussed med's effects and SE's.   Over 30 minutes of exam, counseling, chart review, and critical decision making was performed.   Future Appointments  Date Time Provider [place]  06/04/2019  3:00 PM Unk Pinto, MD [facility] None    ----------------------------------------------------------------------------------------------------------------------  HPI 68 y.o. male   presents for 3 month follow up on hypertension, cholesterol, prediabetes, morbid obesity and vitamin D deficiency.   He completed a 3 month course of terbinafine for bilateral foot onychomycosis and reports much improved and seems to be growing out.   Patient was diagnosed with Prostate Cancer in 2018 and was treated by 40 Radiation sessions July 5 thru Aug 29 and is followed by Dr. Louis Meckel by q67m PSAs which have been low/stable. Last by urology was 0.43 in 03/2018.   Lab Results  Component Value Date   PSA 0.4 10/30/2018   PSA 5.3 (H) 01/17/2016   PSA 4.72 (H) 11/25/2014   he has a diagnosis of GERD which is currently managed by omeprazole 40 mg, taking every other day.  he reports symptoms is currently well controlled, and denies breakthrough reflux, burning in chest, hoarseness or cough.     BMI is Body mass index is 38.63 kg/m., he admits hasn't been working on diet, has been very stressed this past year, but he stopped drinking sweet tea on January 3rd and hasn't had since. Cutting down on sugary foods in general. Working on portions. Weight goal 220 lb. Not intentionally active but reports still working and walking/active at work and also around the house. He does weigh himself  Wt Readings from Last 3 Encounters:  02/04/19 277 lb (125.6 kg)  10/30/18 276 lb 3.2 oz (125.3 kg)  07/29/18 276 lb (125.2 kg)   His blood pressure has been controlled at home, today their BP is BP: 134/82  He does not workout, but walks a lot at work all day. He denies chest pain, shortness of breath, dizziness.   He is not on cholesterol medication and denies myalgias. His cholesterol is not at goal. The cholesterol last visit was:  Lab Results  Component Value Date   CHOL 185 10/30/2018   HDL 46 10/30/2018   LDLCALC 107 (H) 10/30/2018   TRIG 203 (H) 10/30/2018   CHOLHDL 4.0 10/30/2018    He has been working on diet for prediabetes, and denies foot ulcerations, increased appetite, nausea, paresthesia  of the feet, polydipsia, polyuria, visual disturbances and vomiting. Last A1C in the office was:  Lab Results  Component Value Date   HGBA1C 6.0 (H) 10/30/2018    He has stable CKD IIIa associated with htn and prediabetes monitored at this office:  Lab Results  Component Value Date   GFRNONAA 99 (L) 10/30/2018   Patient is on Vitamin D supplement and at goal at last check:   Lab Results  Component Value Date   VD25OH 72 10/30/2018       Current Medications:  Current Outpatient Medications on File Prior to Visit  Medication Sig  . aspirin 81 MG tablet Take 162 mg by mouth daily.   . bisoprolol-hydrochlorothiazide (ZIAC) 10-6.25 MG tablet Take 1 tablet Daily for  BP  . Cholecalciferol (VITAMIN D3) 5000 UNITS CAPS Take 5,000 Units by mouth daily.  Marland Kitchen losartan (COZAAR) 50 MG tablet TAKE 1 TABLE DAILY FOR BLOOD PRESSURE.  Marland Kitchen omeprazole (PRILOSEC) 40 MG capsule TAKE 1 CAPSULE BY MOUTH 2 TIMES A DAY.  . vitamin B-12 (CYANOCOBALAMIN) 50 MCG tablet Take 50 mcg by mouth. Takes 1 tablet every other day.  . vitamin C (ASCORBIC ACID) 500 MG tablet Take 500 mg by mouth daily.  . Zinc 50 MG TABS Take 50 mg by mouth daily.   No current facility-administered medications on file prior to visit.      Allergies:  Allergies  Allergen Reactions  . Ace Inhibitors Other (See Comments)    Cough   . Citalopram Nausea And Vomiting     Medical History:  Past Medical History:  Diagnosis Date  . GERD (gastroesophageal reflux disease)   . Hemorrhoids   . History of Helicobacter pylori infection   . HTN (hypertension)   . Hyperlipemia    pt denies 08/08/12  . Obesity   . Peripheral vascular disease ([place]) 9/14   DVT  left lower leg  . Prostate cancer ([place])   . Vitamin D deficiency    Family history- Reviewed and unchanged Social history- Reviewed and unchanged   Review of Systems:  Review of Systems  Constitutional: Negative for malaise/fatigue and weight loss.  HENT: Negative for hearing  loss and tinnitus.   Eyes: Negative for blurred vision and double vision.  Respiratory: Negative for cough, shortness of breath and wheezing.   Cardiovascular: Negative for chest pain, palpitations, orthopnea, claudication and leg swelling.  Gastrointestinal: Negative for abdominal pain, blood in stool, constipation, diarrhea, heartburn, melena, nausea and vomiting.  Genitourinary: Negative.   Musculoskeletal: Negative for joint pain and myalgias.  Skin: Negative for rash.  Neurological: Negative for dizziness, tingling, sensory change, weakness and headaches.  Endo/Heme/Allergies: Negative for polydipsia.  Psychiatric/Behavioral: Negative.   All other systems reviewed and are negative.   Physical Exam: BP 134/82   Pulse 70   Temp (!) 97.5 F (36.4 C)   Ht 5\' 11"  (1.803 m)   Wt 277 lb (125.6 kg)   SpO2 96%   BMI 38.63 kg/m  Wt Readings from Last 3 Encounters:  02/04/19 277 lb (125.6 kg)  10/30/18 276 lb 3.2 oz (125.3 kg)  07/29/18 276 lb (125.2 kg)   General Appearance: Well nourished, in no apparent  distress. Eyes: PERRLA, EOMs, conjunctiva no swelling or erythema Sinuses: No Frontal/maxillary tenderness ENT/Mouth: Ext aud canals bil obstructed by soft wax, TMs not visualized. No erythema, swelling, or exudate on post pharynx.  Tonsils not swollen or erythematous. Hearing normal.  Neck: Supple, thyroid normal.  Respiratory: Respiratory effort normal, BS equal bilaterally without rales, rhonchi, wheezing or stridor.  Cardio: RRR with no MRGs. Brisk peripheral pulses without edema.  Abdomen: Soft, + BS.  Non tender, no guarding, rebound, hernias, masses. Lymphatics: Non tender without lymphadenopathy.  Musculoskeletal: Full ROM, 5/5 strength, Normal gait Skin: Warm, dry without rashes, lesions, ecchymosis.  Neuro: Cranial nerves intact. No cerebellar symptoms.  Psych: Awake and oriented X 3, normal affect, Insight and Judgment appropriate.    Izora Ribas, NP 2:51  PM [facility]

## 2019-02-04 ENCOUNTER — Ambulatory Visit: Payer: BC Managed Care – PPO | Admitting: Adult Health

## 2019-02-04 ENCOUNTER — Other Ambulatory Visit: Payer: Self-pay

## 2019-02-04 ENCOUNTER — Encounter: Payer: Self-pay | Admitting: Adult Health

## 2019-02-04 VITALS — BP 134/82 | HR 70 | Temp 97.5°F | Ht 71.0 in | Wt 277.0 lb

## 2019-02-04 DIAGNOSIS — C61 Malignant neoplasm of prostate: Secondary | ICD-10-CM

## 2019-02-04 DIAGNOSIS — E559 Vitamin D deficiency, unspecified: Secondary | ICD-10-CM

## 2019-02-04 DIAGNOSIS — E782 Mixed hyperlipidemia: Secondary | ICD-10-CM

## 2019-02-04 DIAGNOSIS — Z23 Encounter for immunization: Secondary | ICD-10-CM

## 2019-02-04 DIAGNOSIS — H6123 Impacted cerumen, bilateral: Secondary | ICD-10-CM | POA: Diagnosis not present

## 2019-02-04 DIAGNOSIS — K219 Gastro-esophageal reflux disease without esophagitis: Secondary | ICD-10-CM | POA: Diagnosis not present

## 2019-02-04 DIAGNOSIS — I10 Essential (primary) hypertension: Secondary | ICD-10-CM | POA: Diagnosis not present

## 2019-02-04 DIAGNOSIS — Z6838 Body mass index (BMI) 38.0-38.9, adult: Secondary | ICD-10-CM

## 2019-02-04 DIAGNOSIS — R7309 Other abnormal glucose: Secondary | ICD-10-CM

## 2019-02-04 DIAGNOSIS — Z79899 Other long term (current) drug therapy: Secondary | ICD-10-CM | POA: Diagnosis not present

## 2019-02-04 NOTE — Patient Instructions (Addendum)
Goals    . Exercise 150 min/wk Moderate Activity    . HEMOGLOBIN A1C < 5.7    . Weight (lb) < 220 lb (99.8 kg)       Look into health team advanced - basic plan   Try to eat small meals more frequently (4-5 daily) - make sure each meal has protein (nuts, beans, lean protein) and high fiber fruits and veggies  Modest healthy fats (nuts, olives, olive oil, avocado, etc)   65-80 fluid ounces of water daily   Aim to lose 0.5-2 lb /week    Drink 1/2 your body weight in fluid ounces of water daily; drink a tall glass of water 30 min before meals  Don't eat until you're stuffed- listen to your stomach and eat until you are 80% full   Try eating off of a salad plate; wait 10 min after finishing before going back for seconds  Start by eating the vegetables on your plate; aim for 50% of your meals to be fruits or vegetables  Then eat your protein - lean meats (grass fed if possible), fish, beans, nuts in moderation  Eat your carbs/starch last ONLY if you still are hungry. If you can, stop before finishing it all  Avoid sugar and flour - the closer it looks to it's original form in nature, typically the better it is for you  Splurge in moderation - "assign" days when you get to splurge and have the "bad stuff" - I like to follow a 80% - 20% plan- "good" choices 80 % of the time, "bad" choices in moderation 20% of the time  Simple equation is: Calories out > calories in = weight loss - even if you eat the bad stuff, if you limit portions, you will still lose weight

## 2019-02-05 LAB — COMPLETE METABOLIC PANEL WITH GFR
AG Ratio: 1.5 (calc) (ref 1.0–2.5)
ALT: 31 U/L (ref 9–46)
AST: 24 U/L (ref 10–35)
Albumin: 4.2 g/dL (ref 3.6–5.1)
Alkaline phosphatase (APISO): 85 U/L (ref 35–144)
BUN: 16 mg/dL (ref 7–25)
CO2: 31 mmol/L (ref 20–32)
Calcium: 9.9 mg/dL (ref 8.6–10.3)
Chloride: 99 mmol/L (ref 98–110)
Creat: 1.09 mg/dL (ref 0.70–1.25)
GFR, Est African American: 80 mL/min/{1.73_m2} (ref >=60)
GFR, Est Non African American: 69 mL/min/{1.73_m2} (ref >=60)
Globulin: 2.8 g/dL (calc) (ref 1.9–3.7)
Glucose, Bld: 104 mg/dL — ABNORMAL HIGH (ref 65–99)
Potassium: 4.4 mmol/L (ref 3.5–5.3)
Sodium: 140 mmol/L (ref 135–146)
Total Bilirubin: 0.4 mg/dL (ref 0.2–1.2)
Total Protein: 7 g/dL (ref 6.1–8.1)

## 2019-02-05 LAB — LIPID PANEL
Cholesterol: 169 mg/dL (ref <200)
HDL: 49 mg/dL (ref >=40)
LDL Cholesterol (Calc): 96 mg/dL (calc)
Non-HDL Cholesterol (Calc): 120 mg/dL (calc) (ref <130)
Total CHOL/HDL Ratio: 3.4 (calc) (ref <5.0)
Triglycerides: 142 mg/dL (ref <150)

## 2019-02-05 LAB — CBC WITH DIFFERENTIAL/PLATELET
Absolute Monocytes: 880 cells/uL (ref 200–950)
Basophils Absolute: 50 cells/uL (ref 0–200)
Basophils Relative: 0.6 %
Eosinophils Absolute: 440 cells/uL (ref 15–500)
Eosinophils Relative: 5.3 %
HCT: 47.1 % (ref 38.5–50.0)
Hemoglobin: 16.1 g/dL (ref 13.2–17.1)
Lymphs Abs: 1710 cells/uL (ref 850–3900)
MCH: 30.6 pg (ref 27.0–33.0)
MCHC: 34.2 g/dL (ref 32.0–36.0)
MCV: 89.4 fL (ref 80.0–100.0)
MPV: 10.3 fL (ref 7.5–12.5)
Monocytes Relative: 10.6 %
Neutro Abs: 5221 cells/uL (ref 1500–7800)
Neutrophils Relative %: 62.9 %
Platelets: 266 10*3/uL (ref 140–400)
RBC: 5.27 10*6/uL (ref 4.20–5.80)
RDW: 12.5 % (ref 11.0–15.0)
Total Lymphocyte: 20.6 %
WBC: 8.3 10*3/uL (ref 3.8–10.8)

## 2019-02-05 LAB — MAGNESIUM: Magnesium: 2 mg/dL (ref 1.5–2.5)

## 2019-02-05 LAB — TSH: TSH: 1.22 mIU/L (ref 0.40–4.50)

## 2019-02-16 ENCOUNTER — Other Ambulatory Visit: Payer: Self-pay | Admitting: Adult Health

## 2019-02-17 ENCOUNTER — Other Ambulatory Visit: Payer: Self-pay

## 2019-02-17 MED ORDER — OMEPRAZOLE 40 MG PO CPDR: DELAYED_RELEASE_CAPSULE | ORAL | 3 refills | Status: DC

## 2019-03-24 DIAGNOSIS — M9904 Segmental and somatic dysfunction of sacral region: Secondary | ICD-10-CM | POA: Diagnosis not present

## 2019-03-24 DIAGNOSIS — M9905 Segmental and somatic dysfunction of pelvic region: Secondary | ICD-10-CM | POA: Diagnosis not present

## 2019-03-24 DIAGNOSIS — M5442 Lumbago with sciatica, left side: Secondary | ICD-10-CM | POA: Diagnosis not present

## 2019-03-24 DIAGNOSIS — M9903 Segmental and somatic dysfunction of lumbar region: Secondary | ICD-10-CM | POA: Diagnosis not present

## 2019-03-26 DIAGNOSIS — M9904 Segmental and somatic dysfunction of sacral region: Secondary | ICD-10-CM | POA: Diagnosis not present

## 2019-03-26 DIAGNOSIS — M5442 Lumbago with sciatica, left side: Secondary | ICD-10-CM | POA: Diagnosis not present

## 2019-03-26 DIAGNOSIS — M9903 Segmental and somatic dysfunction of lumbar region: Secondary | ICD-10-CM | POA: Diagnosis not present

## 2019-03-26 DIAGNOSIS — M9905 Segmental and somatic dysfunction of pelvic region: Secondary | ICD-10-CM | POA: Diagnosis not present

## 2019-03-30 DIAGNOSIS — M9905 Segmental and somatic dysfunction of pelvic region: Secondary | ICD-10-CM | POA: Diagnosis not present

## 2019-03-30 DIAGNOSIS — M9904 Segmental and somatic dysfunction of sacral region: Secondary | ICD-10-CM | POA: Diagnosis not present

## 2019-03-30 DIAGNOSIS — M5442 Lumbago with sciatica, left side: Secondary | ICD-10-CM | POA: Diagnosis not present

## 2019-03-30 DIAGNOSIS — M9903 Segmental and somatic dysfunction of lumbar region: Secondary | ICD-10-CM | POA: Diagnosis not present

## 2019-04-01 DIAGNOSIS — Z23 Encounter for immunization: Secondary | ICD-10-CM | POA: Diagnosis not present

## 2019-04-06 DIAGNOSIS — M9903 Segmental and somatic dysfunction of lumbar region: Secondary | ICD-10-CM | POA: Diagnosis not present

## 2019-04-06 DIAGNOSIS — M5442 Lumbago with sciatica, left side: Secondary | ICD-10-CM | POA: Diagnosis not present

## 2019-04-06 DIAGNOSIS — M9904 Segmental and somatic dysfunction of sacral region: Secondary | ICD-10-CM | POA: Diagnosis not present

## 2019-04-06 DIAGNOSIS — M9905 Segmental and somatic dysfunction of pelvic region: Secondary | ICD-10-CM | POA: Diagnosis not present

## 2019-04-13 DIAGNOSIS — M9903 Segmental and somatic dysfunction of lumbar region: Secondary | ICD-10-CM | POA: Diagnosis not present

## 2019-04-13 DIAGNOSIS — M9904 Segmental and somatic dysfunction of sacral region: Secondary | ICD-10-CM | POA: Diagnosis not present

## 2019-04-13 DIAGNOSIS — M5442 Lumbago with sciatica, left side: Secondary | ICD-10-CM | POA: Diagnosis not present

## 2019-04-13 DIAGNOSIS — M9905 Segmental and somatic dysfunction of pelvic region: Secondary | ICD-10-CM | POA: Diagnosis not present

## 2019-04-16 DIAGNOSIS — M9903 Segmental and somatic dysfunction of lumbar region: Secondary | ICD-10-CM | POA: Diagnosis not present

## 2019-04-16 DIAGNOSIS — M9904 Segmental and somatic dysfunction of sacral region: Secondary | ICD-10-CM | POA: Diagnosis not present

## 2019-04-16 DIAGNOSIS — M9905 Segmental and somatic dysfunction of pelvic region: Secondary | ICD-10-CM | POA: Diagnosis not present

## 2019-04-16 DIAGNOSIS — M5442 Lumbago with sciatica, left side: Secondary | ICD-10-CM | POA: Diagnosis not present

## 2019-04-29 DIAGNOSIS — Z23 Encounter for immunization: Secondary | ICD-10-CM | POA: Diagnosis not present

## 2019-05-05 ENCOUNTER — Other Ambulatory Visit: Payer: Self-pay | Admitting: Internal Medicine

## 2019-05-14 DIAGNOSIS — M9903 Segmental and somatic dysfunction of lumbar region: Secondary | ICD-10-CM | POA: Diagnosis not present

## 2019-05-14 DIAGNOSIS — M5136 Other intervertebral disc degeneration, lumbar region: Secondary | ICD-10-CM | POA: Diagnosis not present

## 2019-05-14 DIAGNOSIS — M9904 Segmental and somatic dysfunction of sacral region: Secondary | ICD-10-CM | POA: Diagnosis not present

## 2019-05-14 DIAGNOSIS — M9905 Segmental and somatic dysfunction of pelvic region: Secondary | ICD-10-CM | POA: Diagnosis not present

## 2019-05-19 DIAGNOSIS — M9903 Segmental and somatic dysfunction of lumbar region: Secondary | ICD-10-CM | POA: Diagnosis not present

## 2019-05-19 DIAGNOSIS — M9905 Segmental and somatic dysfunction of pelvic region: Secondary | ICD-10-CM | POA: Diagnosis not present

## 2019-05-19 DIAGNOSIS — M9904 Segmental and somatic dysfunction of sacral region: Secondary | ICD-10-CM | POA: Diagnosis not present

## 2019-05-19 DIAGNOSIS — M5136 Other intervertebral disc degeneration, lumbar region: Secondary | ICD-10-CM | POA: Diagnosis not present

## 2019-05-21 DIAGNOSIS — M9903 Segmental and somatic dysfunction of lumbar region: Secondary | ICD-10-CM | POA: Diagnosis not present

## 2019-05-21 DIAGNOSIS — M9905 Segmental and somatic dysfunction of pelvic region: Secondary | ICD-10-CM | POA: Diagnosis not present

## 2019-05-21 DIAGNOSIS — M5136 Other intervertebral disc degeneration, lumbar region: Secondary | ICD-10-CM | POA: Diagnosis not present

## 2019-05-21 DIAGNOSIS — M9904 Segmental and somatic dysfunction of sacral region: Secondary | ICD-10-CM | POA: Diagnosis not present

## 2019-05-26 DIAGNOSIS — M9903 Segmental and somatic dysfunction of lumbar region: Secondary | ICD-10-CM | POA: Diagnosis not present

## 2019-05-26 DIAGNOSIS — M9904 Segmental and somatic dysfunction of sacral region: Secondary | ICD-10-CM | POA: Diagnosis not present

## 2019-05-26 DIAGNOSIS — M9905 Segmental and somatic dysfunction of pelvic region: Secondary | ICD-10-CM | POA: Diagnosis not present

## 2019-05-26 DIAGNOSIS — M5136 Other intervertebral disc degeneration, lumbar region: Secondary | ICD-10-CM | POA: Diagnosis not present

## 2019-06-01 DIAGNOSIS — R1084 Generalized abdominal pain: Secondary | ICD-10-CM | POA: Diagnosis not present

## 2019-06-01 DIAGNOSIS — N132 Hydronephrosis with renal and ureteral calculous obstruction: Secondary | ICD-10-CM | POA: Diagnosis not present

## 2019-06-01 DIAGNOSIS — N202 Calculus of kidney with calculus of ureter: Secondary | ICD-10-CM | POA: Diagnosis not present

## 2019-06-03 ENCOUNTER — Encounter: Payer: Self-pay | Admitting: Internal Medicine

## 2019-06-03 NOTE — Patient Instructions (Signed)

## 2019-06-03 NOTE — Progress Notes (Signed)
Annual  Screening/Preventative Visit  & Comprehensive Evaluation & Examination     This very nice 69 y.o. MWM presents for a Screening /Preventative Visit & comprehensive evaluation and management of multiple medical co-morbidities.  Patient has been followed for HTN, HLD, Prediabetes and Vitamin D Deficiency. Patient's GERD is controlled with his diet & Omeprazole.     HTN predates circa 2009. Patient's BP has been controlled at home.  Today's BP is at goal - 136/74. Patient denies any cardiac symptoms as chest pain, palpitations, shortness of breath, dizziness or ankle swelling.     Patient's hyperlipidemia is controlled with diet and medications. Patient denies myalgias or other medication SE's. Last lipids were at goal:  Lab Results  Component Value Date   CHOL 169 02/04/2019   HDL 49 02/04/2019   LDLCALC 96 02/04/2019   TRIG 142 02/04/2019   CHOLHDL 3.4 02/04/2019       Patient has hx/o Moderately severe Obesity (BMI 38.6+) and consequent  prediabetes since  2009 and patient denies reactive hypoglycemic symptoms, visual blurring, diabetic polys or paresthesias. Last A1c was not at goal:  Lab Results  Component Value Date   HGBA1C 6.0 (H) 10/30/2018        Finally, patient has history of Vitamin D Deficiency ("32" / 2009) and last vitamin D was at goal:  Lab Results  Component Value Date   VD25OH 72 10/30/2018    Current Outpatient Medications on File Prior to Visit  Medication Sig  . aspirin 81 MG tablet Take 162 mg by mouth daily.   . bisoprolol-hydrochlorothiazide (ZIAC) 10-6.25 MG tablet Take 1 tablet Daily for  BP  . Cholecalciferol (VITAMIN D3) 5000 UNITS CAPS Take 5,000 Units by mouth daily.  Marland Kitchen losartan (COZAAR) 50 MG tablet Take 1 tablet Daily for BP  . omeprazole (PRILOSEC) 40 MG capsule Take 1 capsule once daily for Indigestion & Heartburn  . vitamin B-12 (CYANOCOBALAMIN) 50 MCG tablet Take 50 mcg by mouth. Takes 1 tablet every other day.  . vitamin C  (ASCORBIC ACID) 500 MG tablet Take 500 mg by mouth daily.  . Zinc 50 MG TABS Take 50 mg by mouth daily.   No current facility-administered medications on file prior to visit.   Allergies  Allergen Reactions  . Ace Inhibitors Other (See Comments)    Cough   . Citalopram Nausea And Vomiting   Past Medical History:  Diagnosis Date  . GERD (gastroesophageal reflux disease)   . Hemorrhoids   . History of Helicobacter pylori infection   . HTN (hypertension)   . Hyperlipemia    pt denies 08/08/12  . Obesity   . Peripheral vascular disease ([place]) 9/14   DVT  left lower leg  . Prostate cancer ([place])   . Vitamin D deficiency    Health Maintenance  Topic Date Due  . Hepatitis C Screening  Never done  . PNA vac Low Risk Adult (1 of 2 - PCV13) 03/17/2014  . Fecal DNA (Cologuard)  12/22/2016  . TETANUS/TDAP  12/02/2018  . INFLUENZA VACCINE  10/04/2019   Immunization History  Administered Date(s) Administered  . Influenza Split 12/23/2013  . Influenza, High Dose Seasonal PF 11/25/2014, 03/20/2017, 02/04/2019  . Influenza, Seasonal, Injecte, Preservative Fre 01/17/2016  . Moderna SARS-COVID-2 Vaccination 04/01/2019, 04/29/2019  . Pneumococcal Polysaccharide-23 12/01/2008  . Tdap 12/01/2008   - Cologard 12/22/2013 - Negative - recc 3 yr f/u - Overdue  Past Surgical History:  Procedure Laterality Date  . CYSTOSCOPY/RETROGRADE/URETEROSCOPY Left 01/02/2013  Procedure: CYSTOSCOPY/LEFT URETERAL WASHINGS/LEFT RETROGRADE PYELOGRAM/ LEFT URETEROSCOPY/URETERAL BIOPSY. BLADDER BIOPSY;  Surgeon: Ardis Hughs, MD;  Location: [facility];  Service: Urology;  Laterality: Left;  With STENT  . HEMORRHOID SURGERY     age 36  . TONSILLECTOMY     Family History  Problem Relation Age of Onset  . Diabetes Father   . Heart disease Father   . Cancer Mother        Bladder   Social History   Socioeconomic History  . Marital status: Married    Spouse name: Not on file  . Number of children: 1    Occupational History  . Occupation: Occupational hygienist: [employer]  Tobacco Use  . Smoking status: Never Smoker  . Smokeless tobacco: Never Used  Substance and Sexual Activity  . Alcohol use: No  . Drug use: No  . Sexual activity: Yes     ROS Constitutional: Denies fever, chills, weight loss/gain, headaches, insomnia,  night sweats or change in appetite. Does c/o fatigue. Eyes: Denies redness, blurred vision, diplopia, discharge, itchy or watery eyes.  ENT: Denies discharge, congestion, post nasal drip, epistaxis, sore throat, earache, hearing loss, dental pain, Tinnitus, Vertigo, Sinus pain or snoring.  Cardio: Denies chest pain, palpitations, irregular heartbeat, syncope, dyspnea, diaphoresis, orthopnea, PND, claudication or edema Respiratory: denies cough, dyspnea, DOE, pleurisy, hoarseness, laryngitis or wheezing.  Gastrointestinal: Denies dysphagia, heartburn, reflux, water brash, pain, cramps, nausea, vomiting, bloating, diarrhea, constipation, hematemesis, melena, hematochezia, jaundice or hemorrhoids Genitourinary: Denies dysuria, frequency, urgency, nocturia, hesitancy, discharge, hematuria or flank pain Musculoskeletal: Denies arthralgia, myalgia, stiffness, Jt. Swelling, pain, limp or strain/sprain. Denies Falls. Skin: Denies puritis, rash, hives, warts, acne, eczema or change in skin lesion Neuro: No weakness, tremor, incoordination, spasms, paresthesia or pain Psychiatric: Denies confusion, memory loss or sensory loss. Denies Depression. Endocrine: Denies change in weight, skin, hair change, nocturia, and paresthesia, diabetic polys, visual blurring or hyper / hypo glycemic episodes.  Heme/Lymph: No excessive bleeding, bruising or enlarged lymph nodes.  Physical Exam  BP 136/74   Pulse 80   Temp (!) 97.5 F (36.4 C)   Resp 16   Ht 5\' 11"  (1.803 m)   Wt 272 lb 6.4 oz (123.6 kg)   BMI 37.99 kg/m   General Appearance: Well nourished and well groomed  and in no apparent distress.  Eyes: PERRLA, EOMs, conjunctiva no swelling or erythema, normal fundi and vessels. Sinuses: No frontal/maxillary tenderness ENT/Mouth: EACs patent / TMs  nl. Nares clear without erythema, swelling, mucoid exudates. Oral hygiene is good. No erythema, swelling, or exudate. Tongue normal, non-obstructing. Tonsils not swollen or erythematous. Hearing normal.  Neck: Supple, thyroid not palpable. No bruits, nodes or JVD. Respiratory: Respiratory effort normal.  BS equal and clear bilateral without rales, rhonci, wheezing or stridor. Cardio: Heart sounds are normal with regular rate and rhythm and no murmurs, rubs or gallops. Peripheral pulses are normal and equal bilaterally without edema. No aortic or femoral bruits. Chest: symmetric with normal excursions and percussion.  Abdomen: Soft, with Nl bowel sounds. Nontender, no guarding, rebound, hernias, masses, or organomegaly.  Lymphatics: Non tender without lymphadenopathy.  Musculoskeletal: Full ROM all peripheral extremities, joint stability, 5/5 strength, and normal gait. Skin: Warm and dry without rashes, lesions, cyanosis, clubbing or  ecchymosis.  Neuro: Cranial nerves intact, reflexes equal bilaterally. Normal muscle tone, no cerebellar symptoms. Sensation intact.  Pysch: Alert and oriented X 3 with normal affect, insight and judgment appropriate.  Assessment and Plan  1. Annual Preventative/Screening Exam   2. Essential hypertension  - EKG 12-Lead - [place], RETROPERITNL ABD,  LTD - Urinalysis, Routine w reflex microscopic - Microalbumin / creatinine urine ratio - CBC with Differential/Platelet - COMPLETE METABOLIC PANEL WITH GFR - Magnesium - TSH  3. Hyperlipidemia, mixed  - EKG 12-Lead - [place], RETROPERITNL ABD,  LTD - Lipid panel - TSH  4. Abnormal glucose  - EKG 12-Lead - [place], RETROPERITNL ABD,  LTD - Hemoglobin A1c - Insulin, random  5. Vitamin D deficiency  - VITAMIN D 25 Hydroxy   6.  Prediabetes  - EKG 12-Lead - [place], RETROPERITNL ABD,  LTD - Hemoglobin A1c - Insulin, random  7. Gastroesophageal reflux disease  - CBC with Differential/Platelet  8. History of prostate cancer  - PSA  9. BPH with obstruction/lower urinary tract symptoms  - PSA  10. Morbid obesity (BMI 35+ with htn, hyperlipidemia, prediabetes)    11. Screening for colorectal cancer  - POC Hemoccult Bld/Stl  12. Screening for ischemic heart disease  - EKG 12-Lead  13. FHx: heart disease  - EKG 12-Lead - [place], RETROPERITNL ABD,  LTD  14. Screening for AAA (aortic abdominal aneurysm)  - [place], RETROPERITNL ABD,  LTD  15. Medication management  - Urinalysis, Routine w reflex microscopic - Microalbumin / creatinine urine ratio - CBC with Differential/Platelet - COMPLETE METABOLIC PANEL WITH GFR - Magnesium - Lipid panel - TSH - Hemoglobin A1c - Insulin, random - VITAMIN D 25 Hydroxy         Patient was counseled in prudent diet, weight control to achieve/maintain BMI less than 25, BP monitoring, regular exercise and medications as discussed.  Discussed med effects and SE's. Routine screening labs and tests as requested with regular follow-up as recommended. Over 40 minutes of exam, counseling, chart review and high complex critical decision making was performed   Kirtland Bouchard, MD

## 2019-06-04 ENCOUNTER — Other Ambulatory Visit: Payer: Self-pay

## 2019-06-04 ENCOUNTER — Ambulatory Visit (INDEPENDENT_AMBULATORY_CARE_PROVIDER_SITE_OTHER): Payer: Medicare HMO | Admitting: Internal Medicine

## 2019-06-04 VITALS — BP 136/74 | HR 80 | Temp 97.5°F | Resp 16 | Ht 71.0 in | Wt 272.4 lb

## 2019-06-04 DIAGNOSIS — Z136 Encounter for screening for cardiovascular disorders: Secondary | ICD-10-CM

## 2019-06-04 DIAGNOSIS — R7303 Prediabetes: Secondary | ICD-10-CM

## 2019-06-04 DIAGNOSIS — I10 Essential (primary) hypertension: Secondary | ICD-10-CM

## 2019-06-04 DIAGNOSIS — N138 Other obstructive and reflux uropathy: Secondary | ICD-10-CM

## 2019-06-04 DIAGNOSIS — Z8546 Personal history of malignant neoplasm of prostate: Secondary | ICD-10-CM | POA: Diagnosis not present

## 2019-06-04 DIAGNOSIS — E559 Vitamin D deficiency, unspecified: Secondary | ICD-10-CM

## 2019-06-04 DIAGNOSIS — R7309 Other abnormal glucose: Secondary | ICD-10-CM | POA: Diagnosis not present

## 2019-06-04 DIAGNOSIS — Z79899 Other long term (current) drug therapy: Secondary | ICD-10-CM

## 2019-06-04 DIAGNOSIS — N401 Enlarged prostate with lower urinary tract symptoms: Secondary | ICD-10-CM | POA: Diagnosis not present

## 2019-06-04 DIAGNOSIS — K219 Gastro-esophageal reflux disease without esophagitis: Secondary | ICD-10-CM | POA: Diagnosis not present

## 2019-06-04 DIAGNOSIS — Z0000 Encounter for general adult medical examination without abnormal findings: Secondary | ICD-10-CM | POA: Diagnosis not present

## 2019-06-04 DIAGNOSIS — Z8249 Family history of ischemic heart disease and other diseases of the circulatory system: Secondary | ICD-10-CM | POA: Diagnosis not present

## 2019-06-04 DIAGNOSIS — Z1211 Encounter for screening for malignant neoplasm of colon: Secondary | ICD-10-CM

## 2019-06-04 DIAGNOSIS — E782 Mixed hyperlipidemia: Secondary | ICD-10-CM | POA: Diagnosis not present

## 2019-06-04 DIAGNOSIS — Z0001 Encounter for general adult medical examination with abnormal findings: Secondary | ICD-10-CM

## 2019-06-05 LAB — CBC WITH DIFFERENTIAL/PLATELET
Absolute Monocytes: 774 cells/uL (ref 200–950)
Basophils Absolute: 54 cells/uL (ref 0–200)
Basophils Relative: 0.6 %
Eosinophils Absolute: 486 cells/uL (ref 15–500)
Eosinophils Relative: 5.4 %
HCT: 46.6 % (ref 38.5–50.0)
Hemoglobin: 15.8 g/dL (ref 13.2–17.1)
Lymphs Abs: 1782 cells/uL (ref 850–3900)
MCH: 30.6 pg (ref 27.0–33.0)
MCHC: 33.9 g/dL (ref 32.0–36.0)
MCV: 90.1 fL (ref 80.0–100.0)
MPV: 10.4 fL (ref 7.5–12.5)
Monocytes Relative: 8.6 %
Neutro Abs: 5904 cells/uL (ref 1500–7800)
Neutrophils Relative %: 65.6 %
Platelets: 268 10*3/uL (ref 140–400)
RBC: 5.17 10*6/uL (ref 4.20–5.80)
RDW: 12.3 % (ref 11.0–15.0)
Total Lymphocyte: 19.8 %
WBC: 9 10*3/uL (ref 3.8–10.8)

## 2019-06-05 LAB — MAGNESIUM: Magnesium: 1.9 mg/dL (ref 1.5–2.5)

## 2019-06-05 LAB — COMPLETE METABOLIC PANEL WITH GFR
AG Ratio: 1.6 (calc) (ref 1.0–2.5)
ALT: 35 U/L (ref 9–46)
AST: 34 U/L (ref 10–35)
Albumin: 4.1 g/dL (ref 3.6–5.1)
Alkaline phosphatase (APISO): 78 U/L (ref 35–144)
BUN: 18 mg/dL (ref 7–25)
CO2: 32 mmol/L (ref 20–32)
Calcium: 9.8 mg/dL (ref 8.6–10.3)
Chloride: 98 mmol/L (ref 98–110)
Creat: 1.08 mg/dL (ref 0.70–1.18)
GFR, Est African American: 80 mL/min/{1.73_m2} (ref >=60)
GFR, Est Non African American: 69 mL/min/{1.73_m2} (ref >=60)
Globulin: 2.6 g/dL (calc) (ref 1.9–3.7)
Glucose, Bld: 129 mg/dL — ABNORMAL HIGH (ref 65–99)
Potassium: 3.8 mmol/L (ref 3.5–5.3)
Sodium: 139 mmol/L (ref 135–146)
Total Bilirubin: 0.5 mg/dL (ref 0.2–1.2)
Total Protein: 6.7 g/dL (ref 6.1–8.1)

## 2019-06-05 LAB — URINALYSIS, ROUTINE W REFLEX MICROSCOPIC
Bilirubin Urine: NEGATIVE
Glucose, UA: NEGATIVE
Hgb urine dipstick: NEGATIVE
Ketones, ur: NEGATIVE
Leukocytes,Ua: NEGATIVE
Nitrite: NEGATIVE
Protein, ur: NEGATIVE
Specific Gravity, Urine: 1.02 (ref 1.001–1.03)
pH: <=5 (ref 5.0–8.0)

## 2019-06-05 LAB — VITAMIN D 25 HYDROXY (VIT D DEFICIENCY, FRACTURES): Vit D, 25-Hydroxy: 82 ng/mL (ref 30–100)

## 2019-06-05 LAB — INSULIN, RANDOM: Insulin: 38.9 u[IU]/mL — ABNORMAL HIGH

## 2019-06-05 LAB — LIPID PANEL
Cholesterol: 156 mg/dL (ref <200)
HDL: 43 mg/dL (ref >=40)
LDL Cholesterol (Calc): 91 mg/dL (calc)
Non-HDL Cholesterol (Calc): 113 mg/dL (calc) (ref <130)
Total CHOL/HDL Ratio: 3.6 (calc) (ref <5.0)
Triglycerides: 121 mg/dL (ref <150)

## 2019-06-05 LAB — PSA: PSA: 0.2 ng/mL (ref <=4.0)

## 2019-06-05 LAB — HEMOGLOBIN A1C
Hgb A1c MFr Bld: 6.1 %{Hb} — ABNORMAL HIGH (ref <5.7)
Mean Plasma Glucose: 128 (calc)
eAG (mmol/L): 7.1 (calc)

## 2019-06-05 LAB — TSH: TSH: 1.76 mIU/L (ref 0.40–4.50)

## 2019-06-05 LAB — MICROALBUMIN / CREATININE URINE RATIO
Creatinine, Urine: 173 mg/dL (ref 20–320)
Microalb Creat Ratio: 6 ug/mg{creat} (ref <30)
Microalb, Ur: 1.1 mg/dL

## 2019-06-06 ENCOUNTER — Encounter: Payer: Self-pay | Admitting: Internal Medicine

## 2019-06-08 ENCOUNTER — Other Ambulatory Visit: Payer: Self-pay | Admitting: Internal Medicine

## 2019-06-08 DIAGNOSIS — B351 Tinea unguium: Secondary | ICD-10-CM

## 2019-06-08 DIAGNOSIS — H6123 Impacted cerumen, bilateral: Secondary | ICD-10-CM

## 2019-06-08 MED ORDER — TERBINAFINE HCL 250 MG PO TABS: ORAL_TABLET | ORAL | 0 refills | Status: DC

## 2019-06-15 ENCOUNTER — Ambulatory Visit (INDEPENDENT_AMBULATORY_CARE_PROVIDER_SITE_OTHER): Payer: Medicare HMO | Admitting: Otolaryngology

## 2019-06-15 ENCOUNTER — Other Ambulatory Visit: Payer: Self-pay

## 2019-06-15 ENCOUNTER — Encounter (INDEPENDENT_AMBULATORY_CARE_PROVIDER_SITE_OTHER): Payer: Self-pay | Admitting: Otolaryngology

## 2019-06-15 VITALS — Temp 97.7°F

## 2019-06-15 DIAGNOSIS — H6123 Impacted cerumen, bilateral: Secondary | ICD-10-CM

## 2019-06-15 NOTE — Progress Notes (Signed)
HPI: MIKYLE Patton is a 69 y.o. male who presents for evaluation of ear wax buildup in both ears.  The left side is completely obstructed..  Past Medical History:  Diagnosis Date  . GERD (gastroesophageal reflux disease)   . Hemorrhoids   . History of Helicobacter pylori infection   . HTN (hypertension)   . Hyperlipemia    pt denies 08/08/12  . Obesity   . Peripheral vascular disease ([place]) 9/14   DVT  left lower leg  . Prostate cancer ([place])   . Vitamin D deficiency    Past Surgical History:  Procedure Laterality Date  . CYSTOSCOPY/RETROGRADE/URETEROSCOPY Left 01/02/2013   Procedure: CYSTOSCOPY/LEFT URETERAL WASHINGS/LEFT RETROGRADE PYELOGRAM/ LEFT URETEROSCOPY/URETERAL BIOPSY. BLADDER BIOPSY;  Surgeon: Ardis Hughs, MD;  Location: [facility];  Service: Urology;  Laterality: Left;  With STENT  . HEMORRHOID SURGERY     age 33  . TONSILLECTOMY     Social History   Socioeconomic History  . Marital status: Married    Spouse name: Not on file  . Number of children: 1  . Years of education: Not on file  . Highest education level: Not on file  Occupational History  . Occupation: Occupational hygienist: [employer]  Tobacco Use  . Smoking status: Never Smoker  . Smokeless tobacco: Never Used  Substance and Sexual Activity  . Alcohol use: No  . Drug use: No  . Sexual activity: Yes  Other Topics Concern  . Not on file  Social History Narrative  . Not on file   Social Determinants of Health   Financial Resource Strain:   . Difficulty of Paying Living Expenses:   Food Insecurity:   . Worried About Charity fundraiser in the Last Year:   . Arboriculturist in the Last Year:   Transportation Needs:   . Film/video editor (Medical):   Marland Kitchen Lack of Transportation (Non-Medical):   Physical Activity:   . Days of Exercise per Week:   . Minutes of Exercise per Session:   Stress:   . Feeling of Stress :   Social Connections:   . Frequency of Communication with  Friends and Family:   . Frequency of Social Gatherings with Friends and Family:   . Attends Religious Services:   . Active Member of Clubs or Organizations:   . Attends Archivist Meetings:   Marland Kitchen Marital Status:    Family History  Problem Relation Age of Onset  . Diabetes Father   . Heart disease Father   . Cancer Mother        Bladder   Allergies  Allergen Reactions  . Ace Inhibitors Other (See Comments)    Cough   . Citalopram Nausea And Vomiting   Prior to Admission medications   Medication Sig Start Date End Date Taking? Authorizing Provider  aspirin 81 MG tablet Take 162 mg by mouth daily.    Yes [provider]  bisoprolol-hydrochlorothiazide [facility]) 10-6.25 MG tablet Take 1 tablet Daily for  BP 11/29/18  Yes Unk Pinto, MD  Cholecalciferol (VITAMIN D3) 5000 UNITS CAPS Take 5,000 Units by mouth daily.   Yes [provider]  losartan (COZAAR) 50 MG tablet Take 1 tablet Daily for BP 05/05/19  Yes Unk Pinto, MD  omeprazole (PRILOSEC) 40 MG capsule Take 1 capsule once daily for Indigestion & Heartburn 02/17/19  Yes Liane Comber, NP  terbinafine (LAMISIL) 250 MG tablet Take 1 tablet Daily for  Toenail Fungus 06/08/19  Yes Unk Pinto, MD  vitamin B-12 (CYANOCOBALAMIN) 50 MCG tablet Take 50 mcg by mouth. Takes 1 tablet every other day.   Yes [provider]  vitamin C (ASCORBIC ACID) 500 MG tablet Take 500 mg by mouth daily.   Yes [provider]  Zinc 50 MG TABS Take 50 mg by mouth daily.   Yes [provider]     Positive ROS: Otherwise negative  All other systems have been reviewed and were otherwise negative with the exception of those mentioned in the HPI and as above.  Physical Exam: Constitutional: Alert, well-appearing, no acute distress Ears: External ears without lesions or tenderness. Ear canals with a large amount of wax buildup bilaterally.  On the left side the wax adjacent to the left TM.  The  right side could be cleaned with curettes and forceps.  The left side had to be irrigated and suctioned.  TMs were clear bilaterally with symmetric hearing after cleaning the ears. Nasal: External nose without lesions. Clear nasal passages Oral: Oropharynx clear. Neck: No palpable adenopathy or masses Respiratory: Breathing comfortably  Skin: No facial/neck lesions or rash noted.  Cerumen impaction removal  Date/Time: 06/15/2019 5:49 PM Performed by: Rozetta Nunnery, MD Authorized by: Rozetta Nunnery, MD   Consent:    Consent obtained:  Verbal   Consent given by:  Patient   Risks discussed:  Pain and bleeding Procedure details:    Location:  L ear and R ear   Procedure type: curette, irrigation, suction and forceps   Post-procedure details:    Inspection:  TM intact and canal normal   Hearing quality:  Improved   Patient tolerance of procedure:  Tolerated well, no immediate complications Comments:     TMs are clear bilaterally.  Of note he has slightly small ear canals bilaterally with a prominent anterior canal wall bulge.    Assessment: Bilateral cerumen impactions left side worse than right  Plan: He will follow-up as needed  Radene Journey, MD

## 2019-06-23 DIAGNOSIS — M9904 Segmental and somatic dysfunction of sacral region: Secondary | ICD-10-CM | POA: Diagnosis not present

## 2019-06-23 DIAGNOSIS — M9905 Segmental and somatic dysfunction of pelvic region: Secondary | ICD-10-CM | POA: Diagnosis not present

## 2019-06-23 DIAGNOSIS — M5136 Other intervertebral disc degeneration, lumbar region: Secondary | ICD-10-CM | POA: Diagnosis not present

## 2019-06-23 DIAGNOSIS — M9903 Segmental and somatic dysfunction of lumbar region: Secondary | ICD-10-CM | POA: Diagnosis not present

## 2019-06-30 DIAGNOSIS — M9903 Segmental and somatic dysfunction of lumbar region: Secondary | ICD-10-CM | POA: Diagnosis not present

## 2019-06-30 DIAGNOSIS — M5136 Other intervertebral disc degeneration, lumbar region: Secondary | ICD-10-CM | POA: Diagnosis not present

## 2019-06-30 DIAGNOSIS — M9905 Segmental and somatic dysfunction of pelvic region: Secondary | ICD-10-CM | POA: Diagnosis not present

## 2019-06-30 DIAGNOSIS — M9904 Segmental and somatic dysfunction of sacral region: Secondary | ICD-10-CM | POA: Diagnosis not present

## 2019-07-07 DIAGNOSIS — M9904 Segmental and somatic dysfunction of sacral region: Secondary | ICD-10-CM | POA: Diagnosis not present

## 2019-07-07 DIAGNOSIS — M9903 Segmental and somatic dysfunction of lumbar region: Secondary | ICD-10-CM | POA: Diagnosis not present

## 2019-07-07 DIAGNOSIS — M9905 Segmental and somatic dysfunction of pelvic region: Secondary | ICD-10-CM | POA: Diagnosis not present

## 2019-07-07 DIAGNOSIS — M5136 Other intervertebral disc degeneration, lumbar region: Secondary | ICD-10-CM | POA: Diagnosis not present

## 2019-07-15 DIAGNOSIS — M9903 Segmental and somatic dysfunction of lumbar region: Secondary | ICD-10-CM | POA: Diagnosis not present

## 2019-07-15 DIAGNOSIS — M5136 Other intervertebral disc degeneration, lumbar region: Secondary | ICD-10-CM | POA: Diagnosis not present

## 2019-07-15 DIAGNOSIS — M9905 Segmental and somatic dysfunction of pelvic region: Secondary | ICD-10-CM | POA: Diagnosis not present

## 2019-07-15 DIAGNOSIS — M9904 Segmental and somatic dysfunction of sacral region: Secondary | ICD-10-CM | POA: Diagnosis not present

## 2019-08-26 DIAGNOSIS — R69 Illness, unspecified: Secondary | ICD-10-CM | POA: Diagnosis not present

## 2019-09-09 NOTE — Progress Notes (Signed)
MEDICARE ANNUAL WELLNESS VISIT AND FOLLOW UP Assessment:   Jason Patton was seen today for medicare wellness and follow-up.  Diagnoses and all orders for this visit:  Welcome to Medicare preventive visit  Essential hypertension Continue medication; typically well controlled Monitor blood pressure at home; call if consistently over 130/80 Continue DASH diet.   Reminder to go to the ER if any CP, SOB, nausea, dizziness, severe HA, changes vision/speech, left arm numbness and tingling and jaw pain. -     CBC with Differential/Platelet -     COMPLETE METABOLIC PANEL WITH GFR -     Magnesium  Gastroesophageal reflux disease, unspecified whether esophagitis present Well managed by PPI and lifestyle Discussed diet, avoiding triggers and other lifestyle changes  Prostate cancer [facility]) S/p radiation, urology following   CKD Stage II  Increase fluids, avoid NSAIDS, monitor sugars, will monitor -     COMPLETE METABOLIC PANEL WITH GFR  BPH with obstruction/lower urinary tract symptoms Monitor; urology following  Vitamin D deficiency At goal at recent check; continue to recommend supplementation for goal of 60-100 Defer vitamin D level  Other abnormal glucose (prediabetes) Discussed disease and risks Discussed diet/exercise, weight management  A1C q71m; defer today, monitor weight and serum glucose by CMP  Morbid obesity (BMI 35+ with htn, hyperlipidemia, prediabetes)  Long discussion about weight loss, diet, and exercise Recommended diet heavy in fruits and veggies and low in animal meats, cheeses, and dairy products, appropriate calorie intake Patient will work on reducing portions, making small sustainable changes Discussed appropriate weight for height and initial goal (<250 lb) Follow up at next visit  Hyperlipidemia, mixed Recently well controlled off of medications Continue low cholesterol diet and exercise, weight loss encouraged Check lipid panel.  -     Lipid panel -      TSH  Need for hepatitis C screening test -     Hepatitis C antibody  Screening for colon cancer -     Cologuard  Need for 23-polyvalent pneumococcal polysaccharide vaccine -     Pneumococcal polysaccharide vaccine 23-valent greater than or equal to 2yo subcutaneous/IM  Onychomycosis of toenail Good progress with lamisil; check LFTs; monitor after completing course for 6 months  Over 30 minutes of exam, counseling, chart review, and critical decision making was performed  Future Appointments  Date Time Provider [place]  12/14/2019  4:00 PM Unk Pinto, MD [facility] None  06/13/2020  2:00 PM Unk Pinto, MD [facility] None     Plan:   During the course of the visit the patient was educated and counseled about appropriate screening and preventive services including:    Pneumococcal vaccine   Influenza vaccine  Prevnar 13  Td vaccine  Screening electrocardiogram  Colorectal cancer screening  Diabetes screening  Glaucoma screening  Nutrition counseling    Subjective:  Jason Santistevan. is a 69 y.o. male who presents for Medicare Annual Wellness Visit and 3 month follow up for HTN, hyperlipidemia, prediabetes, obesity and vitamin D Def.   He is doing 2nd course of terbinafine for bilateral foot onychomycosis and reports much improved and seems to be growing out.   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  Admits wife is a very good cook, this limits him for weight Wt Readings from Last 3 Encounters:  09/10/19 277 lb (  125.6 kg)  06/04/19 272 lb 6.4 oz (123.6 kg)  02/04/19 277 lb (125.6 kg)   His blood pressure has been controlled at home, today their BP is BP: 130/90 He does not workout. He denies chest pain, shortness of breath, dizziness.   He is not on cholesterol  medication and denies myalgias. His cholesterol is at goal. The cholesterol last visit was:   Lab Results  Component Value Date   CHOL 156 06/04/2019   HDL 43 06/04/2019   LDLCALC 91 06/04/2019   TRIG 121 06/04/2019   CHOLHDL 3.6 06/04/2019   He has not been working on diet and exercise for prediabetes, and denies increased appetite, nausea, paresthesia of the feet, polydipsia, polyuria and visual disturbances. Last A1C in the office was:  Lab Results  Component Value Date   HGBA1C 6.1 (H) 06/04/2019   Last GFR Lab Results  Component Value Date   GFRNONAA 69 06/04/2019    Patient is on Vitamin D supplement.   Lab Results  Component Value Date   VD25OH 26 06/04/2019     Patient was diagnosed with Prostate Cancer in 2018 and was treated by 40 Radiation sessions July 5 thru Aug 29 and is followed by Dr. Louis Meckel by q76m PSAs which have been low/stable.  Lab Results  Component Value Date   PSA 0.2 06/04/2019   PSA 0.4 10/30/2018   PSA 5.3 (H) 01/17/2016     Medication Review:   Current Outpatient Medications (Cardiovascular):  .  bisoprolol-hydrochlorothiazide (ZIAC) 10-6.25 MG tablet, Take 1 tablet Daily for  BP .  losartan (COZAAR) 50 MG tablet, Take 1 tablet Daily for BP   Current Outpatient Medications (Analgesics):  .  aspirin 81 MG tablet, Take 162 mg by mouth daily.   Current Outpatient Medications (Hematological):  .  vitamin B-12 (CYANOCOBALAMIN) 50 MCG tablet, Take 50 mcg by mouth. Takes 1 tablet every other day.  Current Outpatient Medications (Other):  Marland Kitchen  Cholecalciferol (VITAMIN D3) 5000 UNITS CAPS, Take 5,000 Units by mouth daily. Marland Kitchen  omeprazole (PRILOSEC) 40 MG capsule, Take 1 capsule once daily for Indigestion & Heartburn .  terbinafine (LAMISIL) 250 MG tablet, Take 1 tablet Daily for Toenail Fungus  Allergies: Allergies  Allergen Reactions  . Ace Inhibitors Other (See Comments)    Cough   . Citalopram Nausea And Vomiting    Current Problems  (verified) has Essential hypertension; Hyperlipidemia, mixed; Vitamin D deficiency; Other abnormal glucose (prediabetes); CKD Stage II ; Morbid obesity (BMI 35+ with htn, hyperlipidemia, prediabetes) ; Prostate cancer ([place]); Gastroesophageal reflux disease; FHx: heart disease; and BPH with obstruction/lower urinary tract symptoms on their problem list.  Screening Tests Health Maintenance  Topic Date Due  . Hepatitis C Screening  Never done  . Fecal DNA (Cologuard)  12/22/2016  . INFLUENZA VACCINE  10/04/2019  . PNA vac Low Risk Adult (2 of 2 - PCV13) 09/09/2020  . COVID-19 Vaccine  Completed  . TETANUS/TDAP  Discontinued    Immunization History  Administered Date(s) Administered  . Influenza Split 12/23/2013  . Influenza, High Dose Seasonal PF 11/25/2014, 03/20/2017, 02/04/2019  . Influenza, Seasonal, Injecte, Preservative Fre 01/17/2016  . Moderna SARS-COVID-2 Vaccination 04/01/2019, 04/29/2019  . Pneumococcal Polysaccharide-23 12/01/2008, 09/10/2019  . Tdap 12/01/2008    Preventative care: Last colonoscopy: never Last cologuard: 2015 OVERDUE - will order today   Prior vaccinations: TD or Tdap: 2010 will get RN Influenza: 02/2019  Pneumococcal: 2010, TODAY  Prevnar13: declines  Shingles/Zostavax: declines Covid 19: 2/2, 2021, moderna  Names of Other Physician/Practitioners you currently use: 1.  [facility] here for primary care 2. - , eye doctor, last visit remote  3. Dr. Vanessa Kick, dentist, last visit, 2021  Patient Care Team: Unk Pinto, MD as PCP - General (Internal Medicine) Ardis Hughs, MD as Attending Physician (Urology)  Surgical: He  has a past surgical history that includes Hemorrhoid surgery; Tonsillectomy; and Cystoscopy/retrograde/ureteroscopy (Left, 01/02/2013). Family His family history includes Cancer in his mother; Diabetes in his father; Heart disease in his father. Social history  He reports that he  has never smoked. He has never used smokeless tobacco. He reports that he does not drink alcohol and does not use drugs.  MEDICARE WELLNESS OBJECTIVES: Physical activity: Current Exercise Habits: The patient has a physically strenuous job, but has no regular exercise apart from work., Exercise limited by: None identified Cardiac risk factors: Cardiac Risk Factors include: advanced age (>76men, >43 women);dyslipidemia;hypertension;male gender;obesity (BMI >30kg/m2) Depression/mood screen:   Depression screen [facility] 2/9 09/10/2019  Decreased Interest 0  Down, Depressed, Hopeless 0  PHQ - 2 Score 0    ADLs:  In your present state of health, do you have any difficulty performing the following activities: 09/10/2019 06/03/2019  Hearing? N N  Vision? N N  Difficulty concentrating or making decisions? N N  Walking or climbing stairs? N N  Dressing or bathing? N N  Doing errands, shopping? N N  Some recent data might be hidden     Cognitive Testing  Alert? Yes  Normal Appearance?Yes  Oriented to person? Yes  Place? Yes   Time? Yes  Recall of three objects?  Yes  Can perform simple calculations? Yes  Displays appropriate judgment?Yes  Can read the correct time from a watch face?Yes  EOL planning: Does Patient Have a Medical Advance Directive?: No Does patient want to make changes to medical advance directive?: No - Patient declined   Objective:   Today's Vitals   09/10/19 1525  BP: 130/90  Pulse: 64  Temp: (!) 97.3 F (36.3 C)  SpO2: 96%  Weight: 277 lb (125.6 kg)  Height: 5\' 11"  (1.803 m)   Body mass index is 38.63 kg/m.  General appearance: alert, no distress, WD/WN, male HEENT: normocephalic, sclerae anicteric, TMs pearly, nares patent, no discharge or erythema, pharynx normal Oral cavity: MMM, no lesions Neck: supple, no lymphadenopathy, no thyromegaly, no masses Heart: RRR, normal S1, S2, no murmurs Lungs: CTA bilaterally, no wheezes, rhonchi, or rales Abdomen: +bs, soft,  obese, non tender, non distended, no masses, no hepatomegaly, no splenomegaly Musculoskeletal: nontender, no swelling, no obvious deformity Extremities: no edema, no cyanosis, no clubbing Pulses: 2+ symmetric, upper and lower extremities, normal cap refill Neurological: alert, oriented x 3, CN2-12 intact, strength normal upper extremities and lower extremities, sensation normal throughout, DTRs 2+ throughout, no cerebellar signs, gait normal Psychiatric: normal affect, behavior normal, pleasant Skin: warm/dry, intact, without concerning rashes, ecchymosis; bil toe nails with normal bases, thick/yellow and brittle distally    Medicare Attestation I have personally reviewed: The patient's medical and social history Their use of alcohol, tobacco or illicit drugs Their current medications and supplements The patient's functional ability including ADLs,fall risks, home safety risks, cognitive, and hearing and visual impairment Diet and physical activities Evidence for depression or mood disorders  The patient's weight, height, BMI, and visual acuity have been recorded in the chart.  I have made referrals, counseling, and provided education to the patient based on review of the above  and I have provided the patient with a written personalized care plan for preventive services.     Izora Ribas, NP   09/10/2019

## 2019-09-10 ENCOUNTER — Other Ambulatory Visit: Payer: Self-pay

## 2019-09-10 ENCOUNTER — Encounter: Payer: Self-pay | Admitting: Adult Health

## 2019-09-10 ENCOUNTER — Ambulatory Visit (INDEPENDENT_AMBULATORY_CARE_PROVIDER_SITE_OTHER): Payer: Medicare HMO | Admitting: Adult Health

## 2019-09-10 VITALS — BP 130/90 | HR 64 | Temp 97.3°F | Ht 71.0 in | Wt 277.0 lb

## 2019-09-10 DIAGNOSIS — Z1211 Encounter for screening for malignant neoplasm of colon: Secondary | ICD-10-CM

## 2019-09-10 DIAGNOSIS — N182 Chronic kidney disease, stage 2 (mild): Secondary | ICD-10-CM

## 2019-09-10 DIAGNOSIS — E559 Vitamin D deficiency, unspecified: Secondary | ICD-10-CM | POA: Diagnosis not present

## 2019-09-10 DIAGNOSIS — R6889 Other general symptoms and signs: Secondary | ICD-10-CM | POA: Diagnosis not present

## 2019-09-10 DIAGNOSIS — Z0001 Encounter for general adult medical examination with abnormal findings: Secondary | ICD-10-CM

## 2019-09-10 DIAGNOSIS — Z1159 Encounter for screening for other viral diseases: Secondary | ICD-10-CM | POA: Diagnosis not present

## 2019-09-10 DIAGNOSIS — Z23 Encounter for immunization: Secondary | ICD-10-CM | POA: Diagnosis not present

## 2019-09-10 DIAGNOSIS — R7309 Other abnormal glucose: Secondary | ICD-10-CM | POA: Diagnosis not present

## 2019-09-10 DIAGNOSIS — Z0000 Encounter for general adult medical examination without abnormal findings: Secondary | ICD-10-CM

## 2019-09-10 DIAGNOSIS — N401 Enlarged prostate with lower urinary tract symptoms: Secondary | ICD-10-CM | POA: Diagnosis not present

## 2019-09-10 DIAGNOSIS — K219 Gastro-esophageal reflux disease without esophagitis: Secondary | ICD-10-CM

## 2019-09-10 DIAGNOSIS — N138 Other obstructive and reflux uropathy: Secondary | ICD-10-CM

## 2019-09-10 DIAGNOSIS — C61 Malignant neoplasm of prostate: Secondary | ICD-10-CM

## 2019-09-10 DIAGNOSIS — I10 Essential (primary) hypertension: Secondary | ICD-10-CM | POA: Diagnosis not present

## 2019-09-10 DIAGNOSIS — E782 Mixed hyperlipidemia: Secondary | ICD-10-CM | POA: Diagnosis not present

## 2019-09-10 DIAGNOSIS — B351 Tinea unguium: Secondary | ICD-10-CM

## 2019-09-10 NOTE — Patient Instructions (Addendum)
Jason Patton , Thank you for taking time to come for your Medicare Wellness Visit. I appreciate your ongoing commitment to your health goals. Please review the following plan we discussed and let me know if I can assist you in the future.   These are the goals we discussed: Goals    . HEMOGLOBIN A1C < 5.7    . Weight (lb) < 250 lb (113.4 kg)       This is a list of the screening recommended for you and due dates:  Health Maintenance  Topic Date Due  .  Hepatitis C: One time screening is recommended by Center for Disease Control  (CDC) for  adults born from [birth year] through 1966.   Never done  . Pneumonia vaccines (1 of 2 - PCV13) 03/17/2014  . Cologuard (Stool DNA test)  12/22/2016  . Flu Shot  10/04/2019  . COVID-19 Vaccine  Completed  . Tetanus Vaccine  Discontinued     Drink 1/2 your body weight in fluid ounces of water daily; drink a tall glass of water 30 min before meals  Don't eat until you're stuffed- listen to your stomach and eat until you are 80% full   Try eating off of a salad plate; wait 10 min after finishing before going back for seconds  Start by eating the vegetables on your plate; aim for 50% of your meals to be fruits or vegetables  Then eat your protein - lean meats (grass fed if possible), fish, beans, nuts in moderation  Eat your carbs/starch last ONLY if you still are hungry. If you can, stop before finishing it all  Avoid sugar and flour - the closer it looks to it's original form in nature, typically the better it is for you  Splurge in moderation - "assign" days when you get to splurge and have the "bad stuff" - I like to follow a 80% - 20% plan- "good" choices 80 % of the time, "bad" choices in moderation 20% of the time  Simple equation is: Calories out > calories in = weight loss - even if you eat the bad stuff, if you limit portions, you will still lose weight         Bupropion sustained-release tablets (Depression/Mood Disorders) What is this  medicine? BUPROPION (byoo PROE pee on) is used to treat depression. This medicine may be used for other purposes; ask your health care provider or pharmacist if you have questions. COMMON BRAND NAME(S): Budeprion SR, Wellbutrin SR What should I tell my health care provider before I take this medicine? They need to know if you have any of these conditions:  an eating disorder, such as anorexia or bulimia  bipolar disorder or psychosis  diabetes or high blood sugar, treated with medication  glaucoma  head injury or brain tumor  heart disease, previous heart attack, or irregular heart beat  high blood pressure  kidney or liver disease  seizures  suicidal thoughts or a previous suicide attempt  Tourette's syndrome  weight loss  an unusual or allergic reaction to bupropion, other medicines, foods, dyes, or preservatives  breast-feeding  pregnant or trying to become pregnant How should I use this medicine? Take this medicine by mouth with a glass of water. Follow the directions on the prescription label. You can take it with or without food. If it upsets your stomach, take it with food. Do not cut, crush or chew this medicine. Take your medicine at regular intervals. If you take this medicine more  than once a day, take your second dose at least 8 hours after you take your first dose. To limit difficulty in sleeping, avoid taking this medicine at bedtime. Do not take your medicine more often than directed. Do not stop taking this medicine suddenly except upon the advice of your doctor. Stopping this medicine too quickly may cause serious side effects or your condition may worsen. A special MedGuide will be given to you by the pharmacist with each prescription and refill. Be sure to read this information carefully each time. Talk to your pediatrician regarding the use of this medicine in children. Special care may be needed. Overdosage: If you think you have taken too much of this  medicine contact a poison control center or emergency room at once. NOTE: This medicine is only for you. Do not share this medicine with others. What if I miss a dose? If you miss a dose, skip the missed dose and take your next tablet at the regular time. There should be at least 8 hours between doses. Do not take double or extra doses. What may interact with this medicine? Do not take this medicine with any of the following medications:  linezolid  MAOIs like Azilect, Carbex, Eldepryl, Marplan, Nardil, and Parnate  methylene blue (injected into a vein)  other medicines that contain bupropion like Zyban This medicine may also interact with the following medications:  alcohol  certain medicines for anxiety or sleep  certain medicines for blood pressure like metoprolol, propranolol  certain medicines for depression or psychotic disturbances  certain medicines for HIV or AIDS like efavirenz, lopinavir, nelfinavir, ritonavir  certain medicines for irregular heart beat like propafenone, flecainide  certain medicines for Parkinson's disease like amantadine, levodopa  certain medicines for seizures like carbamazepine, phenytoin, phenobarbital  cimetidine  clopidogrel  cyclophosphamide  digoxin  furazolidone  isoniazid  nicotine  orphenadrine  procarbazine  steroid medicines like prednisone or cortisone  stimulant medicines for attention disorders, weight loss, or to stay awake  tamoxifen  theophylline  thiotepa  ticlopidine  tramadol  warfarin This list may not describe all possible interactions. Give your health care provider a list of all the medicines, herbs, non-prescription drugs, or dietary supplements you use. Also tell them if you smoke, drink alcohol, or use illegal drugs. Some items may interact with your medicine. What should I watch for while using this medicine? Tell your doctor if your symptoms do not get better or if they get worse. Visit  your doctor or healthcare provider for regular checks on your progress. Because it may take several weeks to see the full effects of this medicine, it is important to continue your treatment as prescribed by your doctor. This medicine may cause serious skin reactions. They can happen weeks to months after starting the medicine. Contact your healthcare provider right away if you notice fevers or flu-like symptoms with a rash. The rash may be red or purple and then turn into blisters or peeling of the skin. Or, you might notice a red rash with swelling of the face, lips or lymph nodes in your neck or under your arms. Patients and their families should watch out for new or worsening thoughts of suicide or depression. Also watch out for sudden changes in feelings such as feeling anxious, agitated, panicky, irritable, hostile, aggressive, impulsive, severely restless, overly excited and hyperactive, or not being able to sleep. If this happens, especially at the beginning of treatment or after a change in dose, call your healthcare  provider. Avoid alcoholic drinks while taking this medicine. Drinking excessive alcoholic beverages, using sleeping or anxiety medicines, or quickly stopping the use of these agents while taking this medicine may increase your risk for a seizure. Do not drive or use heavy machinery until you know how this medicine affects you. This medicine can impair your ability to perform these tasks. Do not take this medicine close to bedtime. It may prevent you from sleeping. Your mouth may get dry. Chewing sugarless gum or sucking hard candy, and drinking plenty of water may help. Contact your doctor if the problem does not go away or is severe. What side effects may I notice from receiving this medicine? Side effects that you should report to your doctor or health care professional as soon as possible:  allergic reactions like skin rash, itching or hives, swelling of the face, lips, or  tongue  breathing problems  changes in vision  confusion  elevated mood, decreased need for sleep, racing thoughts, impulsive behavior  fast or irregular heartbeat  hallucinations, loss of contact with reality  increased blood pressure  rash, fever, and swollen lymph nodes  redness, blistering, peeling or loosening of the skin, including inside the mouth  seizures  suicidal thoughts or other mood changes  unusually weak or tired  vomiting Side effects that usually do not require medical attention (report to your doctor or health care professional if they continue or are bothersome):  constipation  headache  loss of appetite  nausea  tremors  weight loss This list may not describe all possible side effects. Call your doctor for medical advice about side effects. You may report side effects to FDA at 1-800-FDA-1088. Where should I keep my medicine? Keep out of the reach of children. Store at room temperature between 20 and 25 degrees C (68 and 77 degrees F), away from direct sunlight and moisture. Keep tightly closed. Throw away any unused medicine after the expiration date. NOTE: This sheet is a summary. It may not cover all possible information. If you have questions about this medicine, talk to your doctor, pharmacist, or health care provider.  2020 Elsevier/Gold Standard (2018-05-15 13:55:14)

## 2019-09-11 LAB — LIPID PANEL
Cholesterol: 178 mg/dL (ref <200)
HDL: 50 mg/dL (ref >=40)
LDL Cholesterol (Calc): 106 mg/dL (calc) — ABNORMAL HIGH
Non-HDL Cholesterol (Calc): 128 mg/dL (calc) (ref <130)
Total CHOL/HDL Ratio: 3.6 (calc) (ref <5.0)
Triglycerides: 128 mg/dL (ref <150)

## 2019-09-11 LAB — MAGNESIUM: Magnesium: 2.1 mg/dL (ref 1.5–2.5)

## 2019-09-11 LAB — COMPLETE METABOLIC PANEL WITH GFR
AG Ratio: 1.6 (calc) (ref 1.0–2.5)
ALT: 23 U/L (ref 9–46)
AST: 20 U/L (ref 10–35)
Albumin: 4.1 g/dL (ref 3.6–5.1)
Alkaline phosphatase (APISO): 78 U/L (ref 35–144)
BUN: 15 mg/dL (ref 7–25)
CO2: 30 mmol/L (ref 20–32)
Calcium: 9.7 mg/dL (ref 8.6–10.3)
Chloride: 99 mmol/L (ref 98–110)
Creat: 1.08 mg/dL (ref 0.70–1.18)
GFR, Est African American: 80 mL/min/{1.73_m2} (ref >=60)
GFR, Est Non African American: 69 mL/min/{1.73_m2} (ref >=60)
Globulin: 2.6 g/dL (calc) (ref 1.9–3.7)
Glucose, Bld: 119 mg/dL — ABNORMAL HIGH (ref 65–99)
Potassium: 4.2 mmol/L (ref 3.5–5.3)
Sodium: 139 mmol/L (ref 135–146)
Total Bilirubin: 0.4 mg/dL (ref 0.2–1.2)
Total Protein: 6.7 g/dL (ref 6.1–8.1)

## 2019-09-11 LAB — CBC WITH DIFFERENTIAL/PLATELET
Absolute Monocytes: 725 cells/uL (ref 200–950)
Basophils Absolute: 62 cells/uL (ref 0–200)
Basophils Relative: 0.8 %
Eosinophils Absolute: 328 cells/uL (ref 15–500)
Eosinophils Relative: 4.2 %
HCT: 46.3 % (ref 38.5–50.0)
Hemoglobin: 15.8 g/dL (ref 13.2–17.1)
Lymphs Abs: 1708 cells/uL (ref 850–3900)
MCH: 31 pg (ref 27.0–33.0)
MCHC: 34.1 g/dL (ref 32.0–36.0)
MCV: 91 fL (ref 80.0–100.0)
MPV: 10 fL (ref 7.5–12.5)
Monocytes Relative: 9.3 %
Neutro Abs: 4976 cells/uL (ref 1500–7800)
Neutrophils Relative %: 63.8 %
Platelets: 216 10*3/uL (ref 140–400)
RBC: 5.09 10*6/uL (ref 4.20–5.80)
RDW: 12.5 % (ref 11.0–15.0)
Total Lymphocyte: 21.9 %
WBC: 7.8 10*3/uL (ref 3.8–10.8)

## 2019-09-11 LAB — TSH: TSH: 1.54 mIU/L (ref 0.40–4.50)

## 2019-09-11 LAB — HEPATITIS C ANTIBODY
Hepatitis C Ab: NONREACTIVE
SIGNAL TO CUT-OFF: 0.01 (ref <1.00)

## 2019-12-13 ENCOUNTER — Encounter: Payer: Self-pay | Admitting: Internal Medicine

## 2019-12-13 NOTE — Patient Instructions (Signed)

## 2019-12-13 NOTE — Progress Notes (Signed)
History of Present Illness:       This very nice 69 y.o.  MWM presents for 6 month follow up with HTN, HLD, Pre-Diabetes and Vitamin D Deficiency. Patient has GERD controlled on his meds.       Patient is treated for HTN (2009) & BP has been controlled at home. Today's BP is elevated at 146/90. Patient has had no complaints of any cardiac type chest pain, palpitations, dyspnea / orthopnea / PND, dizziness, claudication, or dependent edema.      Hyperlipidemia is not controlled with diet. Last Lipids were not at goal:  Lab Results  Component Value Date   CHOL 178 09/10/2019   HDL 50 09/10/2019   LDLCALC 106 (H) 09/10/2019   TRIG 128 09/10/2019   CHOLHDL 3.6 09/10/2019    Also, the patient has  Moderately Severe Obesity (BMI 38.6+) history of PreDiabetes  (2009)  and has had no symptoms of reactive hypoglycemia, diabetic polys, paresthesias or visual blurring.  Last A1c was not at goal:  Lab Results  Component Value Date   HGBA1C 6.1 (H) 06/04/2019       Further, the patient also has history of Vitamin D Deficiency  ("32" /2009)  and supplements vitamin D without any suspected side-effects. Last vitamin D was at goal:   Lab Results  Component Value Date   VD25OH 82 06/04/2019    Current Outpatient Medications on File Prior to Visit  Medication Sig  . aspirin 81 MG tablet Take 2 tabs  daily.   . bisoprolol-hydrochlorothiazide (ZIAC) 10-6.25 MG tablet Take 1 tablet Daily for  BP  . VITAMIN D 5000 UNITS  Take   daily.  Marland Kitchen losartan 50 MG tablet Take 1 tablet Daily for BP  . omeprazole 40 MG capsule Take 1 capsule once daily for Indigestion & Heartburn  . terbinafine  250 MG tablet Take 1 tablet Daily for Toenail Fungus  . vitamin B-12 50 MCG tablet Take 50 mcg by mouth. Takes 1 tablet every other day.    Allergies  Allergen Reactions  . Ace Inhibitors Other (See Comments)    Cough   . Citalopram Nausea And Vomiting    PMHx:   Past Medical History:  Diagnosis  Date  . GERD (gastroesophageal reflux disease)   . Hemorrhoids   . History of Helicobacter pylori infection   . HTN (hypertension)   . Hyperlipemia    pt denies 08/08/12  . Obesity   . Peripheral vascular disease ([place]) 9/14   DVT  left lower leg  . Prostate cancer ([place])   . Vitamin D deficiency     Immunization History  Administered Date(s) Administered  . Influenza Split 12/23/2013  . Influenza, High Dose Seasonal PF 11/25/2014, 03/20/2017, 02/04/2019  . Influenza, Seasonal, Injecte, Preservative Fre 01/17/2016  . Moderna SARS-COVID-2 Vaccination 04/01/2019, 04/29/2019  . Pneumococcal Polysaccharide-23 12/01/2008, 09/10/2019  . Tdap 12/01/2008    Past Surgical History:  Procedure Laterality Date  . CYSTOSCOPY/RETROGRADE/URETEROSCOPY Left 01/02/2013   Procedure: CYSTOSCOPY/LEFT URETERAL WASHINGS/LEFT RETROGRADE PYELOGRAM/ LEFT URETEROSCOPY/URETERAL BIOPSY. BLADDER BIOPSY;  Surgeon: Ardis Hughs, MD;  Location: [facility];  Service: Urology;  Laterality: Left;  With STENT  . HEMORRHOID SURGERY     age 60  . TONSILLECTOMY      FHx:    Reviewed / unchanged  SHx:    Reviewed / unchanged   Systems Review:  Constitutional: Denies fever, chills, wt changes, headaches, insomnia, fatigue, night sweats, change in appetite. Eyes:  Denies redness, blurred vision, diplopia, discharge, itchy, watery eyes.  ENT: Denies discharge, congestion, post nasal drip, epistaxis, sore throat, earache, hearing loss, dental pain, tinnitus, vertigo, sinus pain, snoring.  CV: Denies chest pain, palpitations, irregular heartbeat, syncope, dyspnea, diaphoresis, orthopnea, PND, claudication or edema. Respiratory: denies cough, dyspnea, DOE, pleurisy, hoarseness, laryngitis, wheezing.  Gastrointestinal: Denies dysphagia, odynophagia, heartburn, reflux, water brash, abdominal pain or cramps, nausea, vomiting, bloating, diarrhea, constipation, hematemesis, melena, hematochezia  or hemorrhoids. Genitourinary:  Denies dysuria, frequency, urgency, nocturia, hesitancy, discharge, hematuria or flank pain. Musculoskeletal: Denies arthralgias, myalgias, stiffness, jt. swelling, pain, limping or strain/sprain.  Skin: Denies pruritus, rash, hives, warts, acne, eczema or change in skin lesion(s). Neuro: No weakness, tremor, incoordination, spasms, paresthesia or pain. Psychiatric: Denies confusion, memory loss or sensory loss. Endo: Denies change in weight, skin or hair change.  Heme/Lymph: No excessive bleeding, bruising or enlarged lymph nodes.  Physical Exam  BP (!) 146/90   Pulse 72   Temp (!) 97.2 F (36.2 C)   Resp 16   Ht 5\' 11"  (1.803 m)   Wt 274 lb 3.2 oz (124.4 kg)   SpO2 96%   BMI 38.24 kg/m   Appears  well nourished, well groomed  and in no distress.  Eyes: PERRLA, EOMs, conjunctiva no swelling or erythema. Sinuses: No frontal/maxillary tenderness ENT/Mouth: EAC's clear, TM's nl w/o erythema, bulging. Nares clear w/o erythema, swelling, exudates. Oropharynx clear without erythema or exudates. Oral hygiene is good. Tongue normal, non obstructing. Hearing intact.  Neck: Supple. Thyroid not palpable. Car 2+/2+ without bruits, nodes or JVD. Chest: Respirations nl with BS clear & equal w/o rales, rhonchi, wheezing or stridor.  Cor: Heart sounds normal w/ regular rate and rhythm without sig. murmurs, gallops, clicks or rubs. Peripheral pulses normal and equal  without edema.  Abdomen: Soft & bowel sounds normal. Non-tender w/o guarding, rebound, hernias, masses or organomegaly.  Lymphatics: Unremarkable.  Musculoskeletal: Full ROM all peripheral extremities, joint stability, 5/5 strength and normal gait.  Skin: Warm, dry without exposed rashes, lesions or ecchymosis apparent.  Neuro: Cranial nerves intact, reflexes equal bilaterally. Sensory-motor testing grossly intact. Tendon reflexes grossly intact.  Pysch: Alert & oriented x 3.  Insight and judgement nl & appropriate. No  ideations.  Assessment and Plan:  1. Essential hypertension  - Continue medication, monitor blood pressure at home.  - Continue DASH diet.  Reminder to go to the ER if any CP,  SOB, nausea, dizziness, severe HA, changes vision/speech.  - CBC with Differential/Platelet - COMPLETE METABOLIC PANEL WITH GFR - Magnesium - TSH  2. Hyperlipidemia, mixed  - Continue diet/meds, exercise,& lifestyle modifications.  - Continue monitor periodic cholesterol/liver & renal functions   - Lipid panel - TSH  3. Glucose intolerance  - Continue diet, exercise  - Lifestyle modifications.  - Monitor appropriate labs.  - Hemoglobin A1c - Insulin, random  4. Vitamin D deficiency  - Continue supplementation.  - VITAMIN D 25 Hydroxy  5. Gastroesophageal reflux disease  - CBC with Differential/Platelet  6. Medication management  - CBC with Differential/Platelet - COMPLETE METABOLIC PANEL WITH GFR - Magnesium - Lipid panel - TSH - Hemoglobin A1c - Insulin, random - VITAMIN D 25 Hydroxy         Discussed  regular exercise, BP monitoring, weight control to achieve/maintain BMI less than 25 and discussed med and SE's. Recommended labs to assess and monitor clinical status with further disposition pending results of labs.  I discussed the assessment and treatment plan  with the patient. The patient was provided an opportunity to ask questions and all were answered. The patient agreed with the plan and demonstrated an understanding of the instructions.  I provided over 30 minutes of exam, counseling, chart review and  complex critical decision making.         The patient was advised to call back or seek an in-person evaluation if the symptoms worsen or if the condition fails to improve as anticipated.   Kirtland Bouchard, MD

## 2019-12-14 ENCOUNTER — Ambulatory Visit (INDEPENDENT_AMBULATORY_CARE_PROVIDER_SITE_OTHER): Payer: Medicare HMO | Admitting: Internal Medicine

## 2019-12-14 ENCOUNTER — Other Ambulatory Visit: Payer: Self-pay

## 2019-12-14 VITALS — BP 146/90 | HR 72 | Temp 97.2°F | Resp 16 | Ht 71.0 in | Wt 274.2 lb

## 2019-12-14 DIAGNOSIS — Z23 Encounter for immunization: Secondary | ICD-10-CM

## 2019-12-14 DIAGNOSIS — E782 Mixed hyperlipidemia: Secondary | ICD-10-CM | POA: Diagnosis not present

## 2019-12-14 DIAGNOSIS — Z79899 Other long term (current) drug therapy: Secondary | ICD-10-CM

## 2019-12-14 DIAGNOSIS — E7439 Other disorders of intestinal carbohydrate absorption: Secondary | ICD-10-CM | POA: Diagnosis not present

## 2019-12-14 DIAGNOSIS — E559 Vitamin D deficiency, unspecified: Secondary | ICD-10-CM

## 2019-12-14 DIAGNOSIS — M25541 Pain in joints of right hand: Secondary | ICD-10-CM

## 2019-12-14 DIAGNOSIS — K219 Gastro-esophageal reflux disease without esophagitis: Secondary | ICD-10-CM

## 2019-12-14 DIAGNOSIS — I10 Essential (primary) hypertension: Secondary | ICD-10-CM | POA: Diagnosis not present

## 2019-12-14 DIAGNOSIS — M25542 Pain in joints of left hand: Secondary | ICD-10-CM | POA: Diagnosis not present

## 2019-12-14 MED ORDER — DEXAMETHASONE 4 MG PO TABS: ORAL_TABLET | ORAL | 0 refills | Status: DC

## 2019-12-15 LAB — CBC WITH DIFFERENTIAL/PLATELET
Absolute Monocytes: 740 cells/uL (ref 200–950)
Basophils Absolute: 52 cells/uL (ref 0–200)
Basophils Relative: 0.6 %
Eosinophils Absolute: 383 cells/uL (ref 15–500)
Eosinophils Relative: 4.4 %
HCT: 48.2 % (ref 38.5–50.0)
Hemoglobin: 16.5 g/dL (ref 13.2–17.1)
Lymphs Abs: 1731 cells/uL (ref 850–3900)
MCH: 30.6 pg (ref 27.0–33.0)
MCHC: 34.2 g/dL (ref 32.0–36.0)
MCV: 89.4 fL (ref 80.0–100.0)
MPV: 10.1 fL (ref 7.5–12.5)
Monocytes Relative: 8.5 %
Neutro Abs: 5794 cells/uL (ref 1500–7800)
Neutrophils Relative %: 66.6 %
Platelets: 251 10*3/uL (ref 140–400)
RBC: 5.39 10*6/uL (ref 4.20–5.80)
RDW: 12.3 % (ref 11.0–15.0)
Total Lymphocyte: 19.9 %
WBC: 8.7 10*3/uL (ref 3.8–10.8)

## 2019-12-15 LAB — COMPLETE METABOLIC PANEL WITH GFR
AG Ratio: 1.7 (calc) (ref 1.0–2.5)
ALT: 26 U/L (ref 9–46)
AST: 21 U/L (ref 10–35)
Albumin: 4.3 g/dL (ref 3.6–5.1)
Alkaline phosphatase (APISO): 80 U/L (ref 35–144)
BUN/Creatinine Ratio: 11 (calc) (ref 6–22)
BUN: 14 mg/dL (ref 7–25)
CO2: 32 mmol/L (ref 20–32)
Calcium: 9.7 mg/dL (ref 8.6–10.3)
Chloride: 98 mmol/L (ref 98–110)
Creat: 1.23 mg/dL — ABNORMAL HIGH (ref 0.70–1.18)
GFR, Est African American: 69 mL/min/{1.73_m2} (ref >=60)
GFR, Est Non African American: 59 mL/min/{1.73_m2} — ABNORMAL LOW (ref >=60)
Globulin: 2.6 g/dL (calc) (ref 1.9–3.7)
Glucose, Bld: 113 mg/dL — ABNORMAL HIGH (ref 65–99)
Potassium: 4.2 mmol/L (ref 3.5–5.3)
Sodium: 139 mmol/L (ref 135–146)
Total Bilirubin: 0.4 mg/dL (ref 0.2–1.2)
Total Protein: 6.9 g/dL (ref 6.1–8.1)

## 2019-12-15 LAB — INSULIN, RANDOM: Insulin: 38.9 u[IU]/mL — ABNORMAL HIGH

## 2019-12-15 LAB — HEMOGLOBIN A1C
Hgb A1c MFr Bld: 6.1 % of total Hgb — ABNORMAL HIGH (ref <5.7)
Mean Plasma Glucose: 128 (calc)
eAG (mmol/L): 7.1 (calc)

## 2019-12-15 LAB — LIPID PANEL
Cholesterol: 177 mg/dL (ref <200)
HDL: 46 mg/dL (ref >=40)
LDL Cholesterol (Calc): 100 mg/dL (calc) — ABNORMAL HIGH
Non-HDL Cholesterol (Calc): 131 mg/dL (calc) — ABNORMAL HIGH (ref <130)
Total CHOL/HDL Ratio: 3.8 (calc) (ref <5.0)
Triglycerides: 192 mg/dL — ABNORMAL HIGH (ref <150)

## 2019-12-15 LAB — MAGNESIUM: Magnesium: 2.1 mg/dL (ref 1.5–2.5)

## 2019-12-15 LAB — VITAMIN D 25 HYDROXY (VIT D DEFICIENCY, FRACTURES): Vit D, 25-Hydroxy: 79 ng/mL (ref 30–100)

## 2019-12-15 LAB — TSH: TSH: 1.79 mIU/L (ref 0.40–4.50)

## 2019-12-15 NOTE — Progress Notes (Signed)
==========================================================  -    Kidney Functions still Stage 3a and Stable  ==========================================================  -  Total Chol = 177  Excellent   - Very low risk for Heart Attack  / Stroke =============================================================  - But Triglycerides (   192    ) or fats in blood are too high  (goal is less than 150)    - Recommend avoid fried & greasy foods,  sweets / candy,   - Avoid white rice  (brown or wild rice or Quinoa is OK),   - Avoid white potatoes  (sweet potatoes are OK)   - Avoid anything made from white flour  - bagels, doughnuts, rolls, buns, biscuits, white and   wheat breads, pizza crust and traditional  pasta made of white flour & egg white  - (vegetarian pasta or spinach or wheat pasta is OK).    - Multi-grain bread is OK - like multi-grain flat bread or  sandwich thins.   - Avoid alcohol in excess.   - Exercise is also important. ==========================================================  -  A1c  6.1%  Blood sugar and A1c are   STILL elevated in  the borderline and early or pre-diabetes range which has the same   300% increased risk for heart attack, stroke, cancer and   alzheimer- type vascular dementia as full blown diabetes.   But the good news is that diet, exercise with  weight loss can cure the early diabetes at this point.  ==========================================================  -  It is very important that you work harder with diet by  avoiding all foods that are white except chicken,   fish & calliflower.  - Avoid white rice  (brown & wild rice is OK),   - Avoid white potatoes  (sweet potatoes in moderation is OK),   White bread or wheat bread or anything made out of   white flour like bagels, donuts, rolls, buns, biscuits, cakes,  - pastries, cookies, pizza crust, and pasta (made from  white flour & egg whites)   - vegetarian pasta or spinach  or wheat pasta is OK.  - Multigrain breads like Arnold's, Pepperidge Farm or   multigrain sandwich thins or high fiber breads like   [place] bread or "Dave's Killer" breads that are  4 to 5 grams fiber per slice !  are best.    Diet, exercise and weight loss can reverse and cure  diabetes in the early stages.   ==========================================================  -  Vitamin D = 79 - Excellent   ==========================================================  -  All Else - CBC - Kidneys - Electrolytes - Liver - Magnesium & Thyroid    - all  Normal / OK ==========================================================

## 2019-12-16 ENCOUNTER — Other Ambulatory Visit: Payer: Self-pay | Admitting: Internal Medicine

## 2020-01-19 DIAGNOSIS — M5136 Other intervertebral disc degeneration, lumbar region: Secondary | ICD-10-CM | POA: Diagnosis not present

## 2020-01-19 DIAGNOSIS — M9903 Segmental and somatic dysfunction of lumbar region: Secondary | ICD-10-CM | POA: Diagnosis not present

## 2020-01-19 DIAGNOSIS — M9905 Segmental and somatic dysfunction of pelvic region: Secondary | ICD-10-CM | POA: Diagnosis not present

## 2020-01-19 DIAGNOSIS — M9904 Segmental and somatic dysfunction of sacral region: Secondary | ICD-10-CM | POA: Diagnosis not present

## 2020-01-26 DIAGNOSIS — M5136 Other intervertebral disc degeneration, lumbar region: Secondary | ICD-10-CM | POA: Diagnosis not present

## 2020-01-26 DIAGNOSIS — M9903 Segmental and somatic dysfunction of lumbar region: Secondary | ICD-10-CM | POA: Diagnosis not present

## 2020-01-26 DIAGNOSIS — M9904 Segmental and somatic dysfunction of sacral region: Secondary | ICD-10-CM | POA: Diagnosis not present

## 2020-01-26 DIAGNOSIS — M9905 Segmental and somatic dysfunction of pelvic region: Secondary | ICD-10-CM | POA: Diagnosis not present

## 2020-02-09 DIAGNOSIS — M5136 Other intervertebral disc degeneration, lumbar region: Secondary | ICD-10-CM | POA: Diagnosis not present

## 2020-02-09 DIAGNOSIS — M9904 Segmental and somatic dysfunction of sacral region: Secondary | ICD-10-CM | POA: Diagnosis not present

## 2020-02-09 DIAGNOSIS — M9903 Segmental and somatic dysfunction of lumbar region: Secondary | ICD-10-CM | POA: Diagnosis not present

## 2020-02-09 DIAGNOSIS — M9905 Segmental and somatic dysfunction of pelvic region: Secondary | ICD-10-CM | POA: Diagnosis not present

## 2020-02-10 ENCOUNTER — Encounter: Payer: Self-pay | Admitting: Adult Health Nurse Practitioner

## 2020-02-10 ENCOUNTER — Other Ambulatory Visit: Payer: Self-pay

## 2020-02-10 ENCOUNTER — Ambulatory Visit: Payer: Medicare HMO | Admitting: Adult Health Nurse Practitioner

## 2020-02-10 DIAGNOSIS — J019 Acute sinusitis, unspecified: Secondary | ICD-10-CM

## 2020-02-10 DIAGNOSIS — I10 Essential (primary) hypertension: Secondary | ICD-10-CM

## 2020-02-10 DIAGNOSIS — Z1152 Encounter for screening for COVID-19: Secondary | ICD-10-CM | POA: Diagnosis not present

## 2020-02-10 LAB — POC COVID19 BINAXNOW: SARS Coronavirus 2 Ag: POSITIVE — AB

## 2020-02-10 MED ORDER — DEXAMETHASONE 4 MG PO TABS: ORAL_TABLET | ORAL | 1 refills | Status: DC

## 2020-02-10 NOTE — Progress Notes (Signed)
Virtual Visit via Telephone Note  I connected with Jason Patton on 02/21/20 at  4:00 PM EST by telephone and verified that I am speaking with the correct person using two identifiers.   I discussed the limitations, risks, security and privacy concerns of performing an evaluation and management service by telephone and the availability of in person appointments. I also discussed with the patient that there may be a patient responsible charge related to this service. The patient expressed understanding and agreed to proceed.  Assessment and Plan:  1. Encounter for screening for COVID-19 - POC COVID-19 -Positive Discussed monoclonial antibodies, will place referral today. Monitor symptoms Contact with any new or worsening symptoms Discussed home O2 monitoring and guidelines, Discussed hospital precautions Discussed continuing supplementation with Vit D Increase water intake Treat symptoms, contact if need assitance with new symptoms management. Dsicussed prevention of viral spread.  Acute non-recurrent sinusitis, unspecified location -     dexamethasone (DECADRON) 4 MG tablet; Take 1 tab 3 x day - 3 days, then 2 x day - 3 days, then 1 tab daily  Essential hypertension Continue current medications: Monitor blood pressure at home; call if consistently over 130/80 Continue DASH diet.   Reminder to go to the ER if any CP, SOB, nausea, dizziness, severe HA, changes vision/speech, left arm numbness and tingling and jaw pain.        Further disposition pending results of labs. Discussed med's effects and SE's.   Over 30 minutes of non-face to face exam, counseling, chart review, and critical decision making was performed.   Future Appointments  Date Time Provider Felt  03/15/2020  4:00 PM Garnet Sierras, NP [facility] None  06/13/2020  2:00 PM Unk Pinto, MD [facility] None     ------------------------------------------------------------------------------------------------------------------   HPI 69 y.o.male presents for evaluation of runny nose and sinus symptoms.  He reports they start three days ago and since symptoms have continue to get worse.  Now he has a stuffy nose and a cough with mild intermittent production.  He reports he had some mild chills last night but has since resolved.  Denies sore throat, oltagia, popping, denies throat drainage.  He did not take anything for his symptoms.  He did take advil two tablets two times a day with food.    Reports he is not aware of being around anyone who has been sick.   Past Medical History:  Diagnosis Date  . GERD (gastroesophageal reflux disease)   . Hemorrhoids   . History of Helicobacter pylori infection   . HTN (hypertension)   . Hyperlipemia    pt denies 08/08/12  . Obesity   . Peripheral vascular disease ([place]) 9/14   DVT  left lower leg  . Prostate cancer ([place])   . Vitamin D deficiency      Allergies  Allergen Reactions  . Ace Inhibitors Other (See Comments)    Cough   . Citalopram Nausea And Vomiting    Current Outpatient Medications on File Prior to Visit  Medication Sig  . aspirin 81 MG tablet Take 162 mg by mouth daily.   . bisoprolol-hydrochlorothiazide (ZIAC) 10-6.25 MG tablet TAKE 1 TABLET BY MOUTH DAILY FOR BLOOD PRESSURE  . Cholecalciferol (VITAMIN D3) 5000 UNITS CAPS Take 5,000 Units by mouth daily.  Marland Kitchen losartan (COZAAR) 50 MG tablet Take 1 tablet Daily for BP  . omeprazole (PRILOSEC) 40 MG capsule Take 1 capsule once daily for Indigestion & Heartburn  . terbinafine (LAMISIL) 250 MG tablet  Take 1 tablet Daily for Toenail Fungus  . vitamin B-12 (CYANOCOBALAMIN) 50 MCG tablet Take 50 mcg by mouth. Takes 1 tablet every other day.   No current facility-administered medications on file prior to visit.    ROS: all negative except above.   Physical Exam:  There were no vitals  taken for this visit.  General Appearance: Well nourished, in no apparent distress. Eyes: PERRLA, EOMs, conjunctiva no swelling or erythema Sinuses: No Frontal/maxillary tenderness ENT/Mouth: Ext aud canals clear, TMs without erythema, bulging. No erythema, swelling, or exudate on post pharynx.  Tonsils not swollen or erythematous. Hearing normal.  Neck: Supple, thyroid normal.  Respiratory: Respiratory effort normal, BS equal bilaterally without rales, rhonchi, wheezing or stridor.  Cardio: RRR with no MRGs. Brisk peripheral pulses without edema.  Abdomen: Soft, + BS.  Non tender, no guarding, rebound, hernias, masses. Lymphatics: Non tender without lymphadenopathy.  Musculoskeletal: Full ROM, 5/5 strength, normal gait.  Skin: Warm, dry without rashes, lesions, ecchymosis.  Neuro: Cranial nerves intact. Normal muscle tone, no cerebellar symptoms. Sensation intact.  Psych: Awake and oriented X 3, normal affect, Insight and Judgment appropriate.     Garnet Sierras, NP 4:55 PM [facility]

## 2020-02-11 ENCOUNTER — Other Ambulatory Visit: Payer: Self-pay | Admitting: Nurse Practitioner

## 2020-02-11 DIAGNOSIS — U071 COVID-19: Secondary | ICD-10-CM

## 2020-02-11 NOTE — Progress Notes (Signed)
I connected by phone with Jason Patton. on 02/11/2020 at 4:34 PM to discuss the potential use of a treatment for mild to moderate COVID-19 viral infection in non-hospitalized patients.  This patient is a 69 y.o. male that meets the FDA criteria for Emergency Use Authorization of bamlanivimab/etesevimab, casirivimab\imdevimab, or sotrovimab  Has a (+) direct SARS-CoV-2 viral test result  Has mild or moderate COVID-19   Is ? 70 years of age and weighs ? 40 kg  Is NOT hospitalized due to COVID-19  Is NOT requiring oxygen therapy or requiring an increase in baseline oxygen flow rate due to COVID-19  Is within 10 days of symptom onset  Has at least one of the high risk factor(s) for progression to severe COVID-19 and/or hospitalization as defined in EUA.  Specific high risk criteria : Older age (>/= 70 yo), BMI > 25 and Cardiovascular disease or hypertension   I have spoken and communicated the following to the patient or parent/caregiver:  1. FDA has authorized the emergency use of bamlanivimab/etesevimab, casirivimab\imdevimab, or sotrovimab for the treatment of mild to moderate COVID-19 in adults and pediatric patients with positive results of direct SARS-CoV-2 viral testing who are 40 years of age and older weighing at least 40 kg, and who are at high risk for progressing to severe COVID-19 and/or hospitalization.  2. The significant known and potential risks and benefits of bamlanivimab/etesevimab, casirivimab\imdevimab, or sotrovimab, and the extent to which such potential risks and benefits are unknown.  3. Information on available alternative treatments and the risks and benefits of those alternatives, including clinical trials.  4. Patients treated with bamlanivimab/etesevimab, casirivimab\imdevimab, or sotrovimab should continue to self-isolate and use infection control measures (e.g., wear mask, isolate, social distance, avoid sharing personal items, clean and disinfect "high  touch" surfaces, and frequent handwashing) according to CDC guidelines.   5. The patient or parent/caregiver has the option to accept or refuse bamlanivimab/etesevimab, casirivimab\imdevimab, or sotrovimab.  After reviewing this information with the patient, the patient has agreed to receive one of the available covid 19 monoclonal antibodies and will be provided an appropriate fact sheet prior to infusion.Beckey Rutter, [place], AGNP-C [phone] ([place])

## 2020-02-13 ENCOUNTER — Ambulatory Visit (HOSPITAL_COMMUNITY)
Admission: RE | Admit: 2020-02-13 | Discharge: 2020-02-13 | Disposition: A | Discharge status: Home / Self Care | Payer: Medicare Other | Source: Ambulatory Visit | Attending: Pulmonary Disease | Admitting: Pulmonary Disease

## 2020-02-13 DIAGNOSIS — U071 COVID-19: Secondary | ICD-10-CM | POA: Diagnosis present

## 2020-02-13 DIAGNOSIS — Z23 Encounter for immunization: Secondary | ICD-10-CM | POA: Insufficient documentation

## 2020-02-13 MED ORDER — EPINEPHRINE 0.3 MG/0.3ML IJ SOAJ: 0.3000 mg | Freq: Once | INTRAMUSCULAR | Status: DC | PRN

## 2020-02-13 MED ORDER — DIPHENHYDRAMINE HCL 50 MG/ML IJ SOLN: 50.0000 mg | Freq: Once | INTRAMUSCULAR | Status: DC | PRN

## 2020-02-13 MED ORDER — METHYLPREDNISOLONE SODIUM SUCC 125 MG IJ SOLR: 125.0000 mg | Freq: Once | INTRAMUSCULAR | Status: DC | PRN

## 2020-02-13 MED ORDER — ALBUTEROL SULFATE HFA 108 (90 BASE) MCG/ACT IN AERS: 2.0000 | INHALATION_SPRAY | Freq: Once | RESPIRATORY_TRACT | Status: DC | PRN

## 2020-02-13 MED ORDER — FAMOTIDINE IN NACL 20-0.9 MG/50ML-% IV SOLN: 20.0000 mg | Freq: Once | INTRAVENOUS | Status: DC | PRN

## 2020-02-13 MED ORDER — SODIUM CHLORIDE 0.9 % IV SOLN: 1200.0000 mg | Freq: Once | INTRAVENOUS | Status: DC

## 2020-02-13 MED ORDER — SODIUM CHLORIDE 0.9 % IV SOLN: INTRAVENOUS | Status: DC | PRN

## 2020-02-13 MED ORDER — SODIUM CHLORIDE 0.9 % IV SOLN
1200.0000 mg | Freq: Once | INTRAVENOUS | Status: AC
  Administered 2020-02-13: 1200 mg via INTRAVENOUS
  Filled 2020-02-13: qty 10

## 2020-02-13 NOTE — Progress Notes (Addendum)
Patient reviewed Fact Sheet for Patients, Parents, and Caregivers for Emergency Use Authorization (EUA) of Casirivimab for the Treatment of Coronavirus. Patient also reviewed and is agreeable to the estimated cost of treatment. Patient is agreeable to proceed.

## 2020-02-13 NOTE — Progress Notes (Signed)
  Diagnosis: COVID-19  Physician:  Patrick Wright  Procedure: [facility]fusion [facility] Med: casirivimab\imdevimab infusion - Provided patient with casirivimab\imdevimab fact sheet for patients, parents and caregivers prior to infusion.  Complications: No immediate complications noted.  Discharge: Discharged home   Tiffanee Mcnee L 02/13/2020  

## 2020-02-13 NOTE — Discharge Instructions (Signed)
10 Things You Can Do to Manage Your COVID-19 Symptoms at Home If you have possible or confirmed COVID-19: 1. Stay home from work and school. And stay away from other public places. If you must go out, avoid using any kind of public transportation, ridesharing, or taxis. 2. Monitor your symptoms carefully. If your symptoms get worse, call your healthcare provider immediately. 3. Get rest and stay hydrated. 4. If you have a medical appointment, call the healthcare provider ahead of time and tell them that you have or may have COVID-19. 5. For medical emergencies, call 911 and notify the dispatch personnel that you have or may have COVID-19. 6. Cover your cough and sneezes with a tissue or use the inside of your elbow. 7. Wash your hands often with soap and water for at least 20 seconds or clean your hands with an alcohol-based hand sanitizer that contains at least 60% alcohol. 8. As much as possible, stay in a specific room and away from other people in your home. Also, you should use a separate bathroom, if available. If you need to be around other people in or outside of the home, wear a mask. 9. Avoid sharing personal items with other people in your household, like dishes, towels, and bedding. 10. Clean all surfaces that are touched often, like counters, tabletops, and doorknobs. Use household cleaning sprays or wipes according to the label instructions. cdc.gov/coronavirus 09/03/2018 This information is not intended to replace advice given to you by your health care provider. Make sure you discuss any questions you have with your health care provider. Document Revised: 02/05/2019 Document Reviewed: 02/05/2019 Elsevier Patient Education  2020 Elsevier Inc. What types of side effects do monoclonal antibody drugs cause?  Common side effects  In general, the more common side effects caused by monoclonal antibody drugs include: . Allergic reactions, such as hives or itching . Flu-like signs and  symptoms, including chills, fatigue, fever, and muscle aches and pains . Nausea, vomiting . Diarrhea . Skin rashes . Low blood pressure   The CDC is recommending patients who receive monoclonal antibody treatments wait at least 90 days before being vaccinated.  Currently, there are no data on the safety and efficacy of mRNA COVID-19 vaccines in persons who received monoclonal antibodies or convalescent plasma as part of COVID-19 treatment. Based on the estimated half-life of such therapies as well as evidence suggesting that reinfection is uncommon in the 90 days after initial infection, vaccination should be deferred for at least 90 days, as a precautionary measure until additional information becomes available, to avoid interference of the antibody treatment with vaccine-induced immune responses. If you have any questions or concerns after the infusion please call the Advanced Practice Provider on call at [phone]. This number is ONLY intended for your use regarding questions or concerns about the infusion post-treatment side-effects.  Please do not provide this number to others for use. For return to work notes please contact your primary care provider.   If someone you know is interested in receiving treatment please have them call the COVID hotline at [phone].   

## 2020-03-14 NOTE — Progress Notes (Signed)
FOLLOW UP 3 MONTH  Assessment:   Jason Patton was seen today for medicare wellness and follow-up.  Diagnoses and all orders for this visit:  Essential hypertension Continue medications: Bisoprolol / HCTZ 10-6.25mg  half tablet and losartan 50mg  . Consider increasing losartan to 100mg  for better control. Monitor blood pressure at home; call if consistently over 130/80 Continue DASH diet.   Reminder to go to the ER if any CP, SOB, nausea, dizziness, severe HA, changes vision/speech, left arm numbness and tingling and jaw pain. -     CBC with Differential/Platelet -     COMPLETE METABOLIC PANEL WITH GFR   Hyperlipidemia, mixed Recently well controlled off of medications Continue low cholesterol diet and exercise, weight loss encouraged Check lipid panel.  -     Lipid panel - Gastroesophageal reflux disease, unspecified whether esophagitis present Well managed by PPI and lifestyle Discussed diet, avoiding triggers and other lifestyle changes  Prostate cancer [facility]) S/p radiation, urology following   CKD Stage II  Increase fluids, avoid NSAIDS, monitor sugars, will monitor -     COMPLETE METABOLIC PANEL WITH GFR  BPH with obstruction/lower urinary tract symptoms Monitor; urology following  Vitamin D deficiency At goal at recent check; continue to recommend supplementation for goal of 60-100 Defer vitamin D level  Other abnormal glucose (prediabetes) Discussed disease and risks Discussed diet/exercise, weight management  A1C q100m; defer today, monitor weight and serum glucose by CMP  Morbid obesity (BMI 35+ with htn, hyperlipidemia, prediabetes)  Long discussion about weight loss, diet, and exercise Recommended diet heavy in fruits and veggies and low in animal meats, cheeses, and dairy products, appropriate calorie intake Patient will work on reducing portions, making small sustainable changes Discussed appropriate weight for height and initial goal (<250 lb) Follow up at next  visit  Onychomycosis of toenail Good progress with lamisil; check LFTs; monitor after completing course for 6 months  Personal history of COVID19 infection Vaccination completed: 04/29/19 [place] antibody infusion 02/13/20  Medication Management Continued  Over 30 minutes of face to face interview,  exam, counseling, chart review, and critical decision making was performed  Future Appointments  Date Time Provider [place]  06/13/2020  2:00 PM Jason Pinto, MD [facility] None     Subjective:  Jason Service. is a 70 y.o. male who presents for 3 month follow up for HTN, HLD, prediabetes, obesity and vitamin D Def.   Report he had covid  01/23/20.  He is 80-90% smell impairment.  He has a mild metallic taste in his mouth. He is doing 2nd course of terbinafine for bilateral foot onychomycosis and reports much improved and seems to be growing out.   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  Admits wife is a very good cook, this limits him for weight Wt Readings from Last 3 Encounters:  03/15/20 268 lb (121.6 kg)  12/14/19 274 lb 3.2 oz (124.4 kg)  09/10/19 277 lb (125.6 kg)   His blood pressure has been controlled at home, today their BP is BP: 140/80 He does not workout. He denies chest pain, shortness of breath, dizziness.   He is not on cholesterol medication and denies myalgias. His cholesterol is at goal. The cholesterol last visit was:   Lab Results  Component Value Date   CHOL 177 12/14/2019  HDL 46 12/14/2019   LDLCALC 100 (H) 12/14/2019   TRIG 192 (H) 12/14/2019   CHOLHDL 3.8 12/14/2019   He has not been working on diet and exercise for prediabetes, and denies increased appetite, nausea, paresthesia of the feet, polydipsia, polyuria and visual disturbances. Last A1C in the office  was:  Lab Results  Component Value Date   HGBA1C 6.1 (H) 12/14/2019   Last GFR Lab Results  Component Value Date   GFRNONAA 59 (L) 12/14/2019    Patient is on Vitamin D supplement.   Lab Results  Component Value Date   VD25OH 32 12/14/2019     Patient was diagnosed with Prostate Cancer in 2018 and was treated by 40 Radiation sessions July 5 thru Aug 29 and is followed by Jason Patton by q46m PSAs which have been low/stable.  Lab Results  Component Value Date   PSA 0.2 06/04/2019   PSA 0.4 10/30/2018   PSA 5.3 (H) 01/17/2016     Medication Review:   Current Outpatient Medications (Cardiovascular):  .  bisoprolol-hydrochlorothiazide (ZIAC) 10-6.25 MG tablet, TAKE 1 TABLET BY MOUTH DAILY FOR BLOOD PRESSURE .  losartan (COZAAR) 50 MG tablet, Take 1 tablet Daily for BP   Current Outpatient Medications (Analgesics):  .  aspirin 81 MG tablet, Take 162 mg by mouth daily.   Current Outpatient Medications (Hematological):  .  vitamin B-12 (CYANOCOBALAMIN) 50 MCG tablet, Take 50 mcg by mouth. Takes 1 tablet every other day.  Current Outpatient Medications (Other):  Marland Kitchen  Cholecalciferol (VITAMIN D3) 5000 UNITS CAPS, Take 5,000 Units by mouth daily. Marland Kitchen  omeprazole (PRILOSEC) 40 MG capsule, Take 1 capsule once daily for Indigestion & Heartburn .  terbinafine (LAMISIL) 250 MG tablet, Take 1 tablet Daily for Toenail Fungus  Allergies: Allergies  Allergen Reactions  . Ace Inhibitors Other (See Comments)    Cough   . Citalopram Nausea And Vomiting    Current Problems (verified) has Essential hypertension; Hyperlipidemia, mixed; Vitamin D deficiency; Other abnormal glucose (prediabetes); CKD Stage II ; Morbid obesity (BMI 35+ with htn, hyperlipidemia, prediabetes) ; Prostate cancer ([place]); Gastroesophageal reflux disease; FHx: heart disease; and BPH with obstruction/lower urinary tract symptoms on their problem list.  Screening Tests Health Maintenance  Topic Date Due  . Fecal DNA  (Cologuard)  12/22/2016  . COVID-19 Vaccine (3 - Moderna risk 4-dose series) 05/27/2019  . PNA vac Low Risk Adult (2 of 2 - PCV13) 09/09/2020  . INFLUENZA VACCINE  Completed  . Hepatitis C Screening  Completed  . TETANUS/TDAP  Discontinued    Immunization History  Administered Date(s) Administered  . Influenza Split 12/23/2013  . Influenza, High Dose Seasonal PF 11/25/2014, 03/20/2017, 02/04/2019, 12/14/2019  . Influenza, Seasonal, Injecte, Preservative Fre 01/17/2016  . Moderna Sars-Covid-2 Vaccination 04/01/2019, 04/29/2019  . Pneumococcal Polysaccharide-23 12/01/2008, 09/10/2019  . Tdap 12/01/2008    Preventative care: Last colonoscopy: never Last cologuard: 2015 OVERDUE - will order today ?  Prior vaccinations: TD or Tdap: 2010 will get RN Influenza: 12/2019  Pneumococcal: 09/2019  Prevnar13: declines  Shingles/Zostavax: declines Covid 19: 2/2, 2021, moderna  Names of Other Physician/Practitioners you currently use: 1. [facility] here for primary care 2. - , eye doctor, last visit remote  3. Dr. Vanessa Kick, dentist, last visit, 2021  Patient Care Team: Jason Pinto, MD as PCP - General (Internal Medicine) Ardis Hughs, MD as Attending Physician (Urology)  Surgical: He  has a past surgical history that includes Hemorrhoid surgery; Tonsillectomy; and  Cystoscopy/retrograde/ureteroscopy (Left, 01/02/2013). Family His family history includes Cancer in his mother; Diabetes in his father; Heart disease in his father. Social history  He reports that he has never smoked. He has never used smokeless tobacco. He reports that he does not drink alcohol and does not use drugs.    Objective:   Today's Vitals   03/15/20 1618  BP: 140/80  Pulse: 76  Temp: 97.7 F (36.5 C)  SpO2: 96%  Weight: 268 lb (121.6 kg)  Height: 5\' 11"  (1.803 m)   Body mass index is 37.38 kg/m.  General appearance: alert, no distress, WD/WN,  male HEENT: normocephalic, sclerae anicteric, TMs pearly, nares patent, no discharge or erythema, pharynx normal Oral cavity: MMM, no lesions Neck: supple, no lymphadenopathy, no thyromegaly, no masses Heart: RRR, normal S1, S2, no murmurs Lungs: CTA bilaterally, no wheezes, rhonchi, or rales Abdomen: +bs, soft, obese, non tender, non distended, no masses, no hepatomegaly, no splenomegaly Musculoskeletal: nontender, no swelling, no obvious deformity Extremities: no edema, no cyanosis, no clubbing Pulses: 2+ symmetric, upper and lower extremities, normal cap refill Neurological: alert, oriented x 3, CN2-12 intact, strength normal upper extremities and lower extremities, sensation normal throughout, DTRs 2+ throughout, no cerebellar signs, gait normal Psychiatric: normal affect, behavior normal, pleasant Skin: warm/dry, intact, without concerning rashes, ecchymosis; bil toe nails with normal bases, thick/yellow and brittle distally        Bayard Males, DNP [facility] 03/15/2020  4:44 PM

## 2020-03-15 ENCOUNTER — Encounter: Payer: Self-pay | Admitting: Adult Health Nurse Practitioner

## 2020-03-15 ENCOUNTER — Other Ambulatory Visit: Payer: Self-pay

## 2020-03-15 ENCOUNTER — Ambulatory Visit (INDEPENDENT_AMBULATORY_CARE_PROVIDER_SITE_OTHER): Payer: Medicare HMO | Admitting: Adult Health Nurse Practitioner

## 2020-03-15 VITALS — BP 140/80 | HR 76 | Temp 97.7°F | Ht 71.0 in | Wt 268.0 lb

## 2020-03-15 DIAGNOSIS — N138 Other obstructive and reflux uropathy: Secondary | ICD-10-CM

## 2020-03-15 DIAGNOSIS — B351 Tinea unguium: Secondary | ICD-10-CM

## 2020-03-15 DIAGNOSIS — E559 Vitamin D deficiency, unspecified: Secondary | ICD-10-CM

## 2020-03-15 DIAGNOSIS — I10 Essential (primary) hypertension: Secondary | ICD-10-CM | POA: Diagnosis not present

## 2020-03-15 DIAGNOSIS — N401 Enlarged prostate with lower urinary tract symptoms: Secondary | ICD-10-CM

## 2020-03-15 DIAGNOSIS — Z79899 Other long term (current) drug therapy: Secondary | ICD-10-CM

## 2020-03-15 DIAGNOSIS — K219 Gastro-esophageal reflux disease without esophagitis: Secondary | ICD-10-CM

## 2020-03-15 DIAGNOSIS — E782 Mixed hyperlipidemia: Secondary | ICD-10-CM | POA: Diagnosis not present

## 2020-03-15 DIAGNOSIS — C61 Malignant neoplasm of prostate: Secondary | ICD-10-CM | POA: Diagnosis not present

## 2020-03-15 DIAGNOSIS — Z8616 Personal history of COVID-19: Secondary | ICD-10-CM

## 2020-03-15 DIAGNOSIS — N182 Chronic kidney disease, stage 2 (mild): Secondary | ICD-10-CM | POA: Diagnosis not present

## 2020-03-15 DIAGNOSIS — R7309 Other abnormal glucose: Secondary | ICD-10-CM | POA: Diagnosis not present

## 2020-03-15 DIAGNOSIS — Z1211 Encounter for screening for malignant neoplasm of colon: Secondary | ICD-10-CM

## 2020-03-16 LAB — COMPLETE METABOLIC PANEL WITH GFR
AG Ratio: 1.4 (calc) (ref 1.0–2.5)
ALT: 30 U/L (ref 9–46)
AST: 22 U/L (ref 10–35)
Albumin: 4.1 g/dL (ref 3.6–5.1)
Alkaline phosphatase (APISO): 76 U/L (ref 35–144)
BUN/Creatinine Ratio: 13 (calc) (ref 6–22)
BUN: 16 mg/dL (ref 7–25)
CO2: 34 mmol/L — ABNORMAL HIGH (ref 20–32)
Calcium: 9.8 mg/dL (ref 8.6–10.3)
Chloride: 98 mmol/L (ref 98–110)
Creat: 1.24 mg/dL — ABNORMAL HIGH (ref 0.70–1.18)
GFR, Est African American: 68 mL/min/{1.73_m2} (ref >=60)
GFR, Est Non African American: 59 mL/min/{1.73_m2} — ABNORMAL LOW (ref >=60)
Globulin: 3 g/dL (calc) (ref 1.9–3.7)
Glucose, Bld: 103 mg/dL — ABNORMAL HIGH (ref 65–99)
Potassium: 4.9 mmol/L (ref 3.5–5.3)
Sodium: 138 mmol/L (ref 135–146)
Total Bilirubin: 0.4 mg/dL (ref 0.2–1.2)
Total Protein: 7.1 g/dL (ref 6.1–8.1)

## 2020-03-16 LAB — CBC WITH DIFFERENTIAL/PLATELET
Absolute Monocytes: 885 cells/uL (ref 200–950)
Basophils Absolute: 53 cells/uL (ref 0–200)
Basophils Relative: 0.7 %
Eosinophils Absolute: 210 cells/uL (ref 15–500)
Eosinophils Relative: 2.8 %
HCT: 46.1 % (ref 38.5–50.0)
Hemoglobin: 16 g/dL (ref 13.2–17.1)
Lymphs Abs: 2250 cells/uL (ref 850–3900)
MCH: 31.2 pg (ref 27.0–33.0)
MCHC: 34.7 g/dL (ref 32.0–36.0)
MCV: 89.9 fL (ref 80.0–100.0)
MPV: 10.1 fL (ref 7.5–12.5)
Monocytes Relative: 11.8 %
Neutro Abs: 4103 cells/uL (ref 1500–7800)
Neutrophils Relative %: 54.7 %
Platelets: 275 10*3/uL (ref 140–400)
RBC: 5.13 10*6/uL (ref 4.20–5.80)
RDW: 12.5 % (ref 11.0–15.0)
Total Lymphocyte: 30 %
WBC: 7.5 10*3/uL (ref 3.8–10.8)

## 2020-05-09 ENCOUNTER — Other Ambulatory Visit: Payer: Self-pay | Admitting: Internal Medicine

## 2020-05-14 ENCOUNTER — Other Ambulatory Visit: Payer: Self-pay | Admitting: Internal Medicine

## 2020-05-18 DIAGNOSIS — M9904 Segmental and somatic dysfunction of sacral region: Secondary | ICD-10-CM | POA: Diagnosis not present

## 2020-05-18 DIAGNOSIS — M5136 Other intervertebral disc degeneration, lumbar region: Secondary | ICD-10-CM | POA: Diagnosis not present

## 2020-05-18 DIAGNOSIS — M9905 Segmental and somatic dysfunction of pelvic region: Secondary | ICD-10-CM | POA: Diagnosis not present

## 2020-05-18 DIAGNOSIS — M9903 Segmental and somatic dysfunction of lumbar region: Secondary | ICD-10-CM | POA: Diagnosis not present

## 2020-05-24 DIAGNOSIS — M9903 Segmental and somatic dysfunction of lumbar region: Secondary | ICD-10-CM | POA: Diagnosis not present

## 2020-05-24 DIAGNOSIS — M9905 Segmental and somatic dysfunction of pelvic region: Secondary | ICD-10-CM | POA: Diagnosis not present

## 2020-05-24 DIAGNOSIS — M9904 Segmental and somatic dysfunction of sacral region: Secondary | ICD-10-CM | POA: Diagnosis not present

## 2020-05-24 DIAGNOSIS — M5136 Other intervertebral disc degeneration, lumbar region: Secondary | ICD-10-CM | POA: Diagnosis not present

## 2020-05-31 DIAGNOSIS — M9903 Segmental and somatic dysfunction of lumbar region: Secondary | ICD-10-CM | POA: Diagnosis not present

## 2020-05-31 DIAGNOSIS — M9904 Segmental and somatic dysfunction of sacral region: Secondary | ICD-10-CM | POA: Diagnosis not present

## 2020-05-31 DIAGNOSIS — M9905 Segmental and somatic dysfunction of pelvic region: Secondary | ICD-10-CM | POA: Diagnosis not present

## 2020-05-31 DIAGNOSIS — M5136 Other intervertebral disc degeneration, lumbar region: Secondary | ICD-10-CM | POA: Diagnosis not present

## 2020-06-07 DIAGNOSIS — M9904 Segmental and somatic dysfunction of sacral region: Secondary | ICD-10-CM | POA: Diagnosis not present

## 2020-06-07 DIAGNOSIS — M9905 Segmental and somatic dysfunction of pelvic region: Secondary | ICD-10-CM | POA: Diagnosis not present

## 2020-06-07 DIAGNOSIS — M9903 Segmental and somatic dysfunction of lumbar region: Secondary | ICD-10-CM | POA: Diagnosis not present

## 2020-06-07 DIAGNOSIS — M5136 Other intervertebral disc degeneration, lumbar region: Secondary | ICD-10-CM | POA: Diagnosis not present

## 2020-06-12 ENCOUNTER — Encounter: Payer: Self-pay | Admitting: Internal Medicine

## 2020-06-12 NOTE — Patient Instructions (Signed)

## 2020-06-12 NOTE — Progress Notes (Signed)
Annual  Screening/Preventative Visit  & Comprehensive Evaluation & Examination  Future Appointments  Date Time Provider [place]  06/13/2020  2:00 PM Unk Pinto, MD [facility] None  06/14/2021  9:00 AM Unk Pinto, MD [facility] None                                              This very nice 70 y.o. MWM presents for a Screening /Preventative Visit & comprehensive evaluation and management of multiple medical co-morbidities.  Patient has been followed for HTN, HLD, Prediabetes and Vitamin D Deficiency. Patient has GERD controlled on his Omeprazole. He reports he had Covid  01/23/20. Patient also requests re-treatment for his Onychomycosis of toenails.       Patient whas hx/o Prostate Cancer (2018) treated byRadiation and is followed by Dr. Louis Meckel by q14months for  PSA' which have been low/stable.      HTN predates since 2009. Patient's BP has been controlled at home.  Today's BP is at goal - 134/82.  Patient has CKD3a (GFR 59) attributed to his HTN. Patient denies any cardiac symptoms as chest pain, palpitations, shortness of breath, dizziness or ankle swelling.      Patient's hyperlipidemia is controlled with diet and medications. Patient denies myalgias or other medication SE's. Last lipids were at goal except elevated Trig's:  Lab Results  Component Value Date   CHOL 177 12/14/2019   HDL 46 12/14/2019   LDLCALC 100 (H) 12/14/2019   TRIG 192 (H) 12/14/2019   CHOLHDL 3.8 12/14/2019        Patient has Moderately severe Obesity (BMI 37.38) with consequent  prediabetes (A1c 5.9% /2009) and patient denies reactive hypoglycemic symptoms, visual blurring, diabetic polys or paresthesias. Last A1c was not at goal:  Lab Results  Component Value Date   HGBA1C 6.1 (H) 12/14/2019        Finally, patient has history of Vitamin D Deficiency ("32" /2009) and last vitamin D was at goal:  Lab Results  Component Value Date   VD25OH 79 12/14/2019    Current Outpatient  Medications on File Prior to Visit  Medication Sig  . aspirin 81 MG Take 2 tablets  daily.   . bisoprolol-hctz 10-6.25 MG TAKE 1 TABLET DAILY   . VITAMIN D 5000 u Take daily.  Marland Kitchen losartan  50 MG  TAKE 1 TABLET DAILY  . omeprazole  40 MG  TAKE 1 CAPSULE 2 x/DAY   . vitamin B-12 50 MCG tablet Take every other day.     Past Medical History:  Diagnosis Date  . GERD (gastroesophageal reflux disease)   . Hemorrhoids   . History of Helicobacter pylori infection   . HTN (hypertension)   . Hyperlipemia   . Obesity   . Peripheral vascular disease ([place]) 9/14   DVT  left lower leg  . Prostate cancer ([place])   . Vitamin D deficiency     Health Maintenance  Topic Date Due  . Fecal DNA (Cologuard)  12/22/2016  . COVID-19 Vaccine (3 - Moderna risk 4-dose series) 05/27/2019  . PNA vac Low Risk Adult (2 of 2 - PCV13) 09/09/2020  . INFLUENZA VACCINE  10/03/2020  . Hepatitis C Screening  Completed  . HPV VACCINES  Aged Out  . TETANUS/TDAP  Discontinued    Immunization History  Administered Date(s) Administered  . Influenza Split 12/23/2013  . Influenza,  High Dose Seasonal PF 03/20/2017, 02/04/2019, 12/14/2019  . Influenza 01/17/2016  . Moderna Sars-Covid-2 Vaccination 04/01/2019, 04/29/2019  . Pneumococcal Polysaccharide-23 12/01/2008, 09/10/2019  . Tdap 12/01/2008   - Cologard 12/22/2013 - Negative - recc 3 yr f/u - Overdue - re-ordered 09/10/2019  and still not returned  Past Surgical History:  Procedure Laterality Date  . CYSTOSCOPY/RETROGRADE/URETEROSCOPY Left 01/02/2013   Procedure: CYSTOSCOPY/LEFT URETERAL WASHINGS/LEFT RETROGRADE PYELOGRAM/ LEFT URETEROSCOPY/URETERAL BIOPSY. BLADDER BIOPSY;  Surgeon: Ardis Hughs, MD;  Location: [facility];  Service: Urology;  Laterality: Left;  With STENT  . HEMORRHOID SURGERY     age 31  . TONSILLECTOMY      Family History  Problem Relation Age of Onset  . Diabetes Father   . Heart disease Father   . Cancer Mother        Bladder     Social History   Socioeconomic History  . Marital status: Married    Spouse name: Altha Harm  . Number of children: 1  Occupational History  . Occupation: Occupational hygienist: [employer]  Tobacco Use  . Smoking status: Never Smoker  . Smokeless tobacco: Never Used  Substance and Sexual Activity  . Alcohol use: No  . Drug use: No  . Sexual activity: Yes    ROS Constitutional: Denies fever, chills, weight loss/gain, headaches, insomnia,  night sweats or change in appetite. Does c/o fatigue. Eyes: Denies redness, blurred vision, diplopia, discharge, itchy or watery eyes.  ENT: Denies discharge, congestion, post nasal drip, epistaxis, sore throat, earache, hearing loss, dental pain, Tinnitus, Vertigo, Sinus pain or snoring.  Cardio: Denies chest pain, palpitations, irregular heartbeat, syncope, dyspnea, diaphoresis, orthopnea, PND, claudication or edema Respiratory: denies cough, dyspnea, DOE, pleurisy, hoarseness, laryngitis or wheezing.  Gastrointestinal: Denies dysphagia, heartburn, reflux, water brash, pain, cramps, nausea, vomiting, bloating, diarrhea, constipation, hematemesis, melena, hematochezia, jaundice or hemorrhoids Genitourinary: Denies dysuria, frequency, urgency, nocturia, hesitancy, discharge, hematuria or flank pain Musculoskeletal: Denies arthralgia, myalgia, stiffness, Jt. Swelling, pain, limp or strain/sprain. Denies Falls. Skin: Denies puritis, rash, hives, warts, acne, eczema or change in skin lesion Neuro: No weakness, tremor, incoordination, spasms, paresthesia or pain Psychiatric: Denies confusion, memory loss or sensory loss. Denies Depression. Endocrine: Denies change in weight, skin, hair change, nocturia, and paresthesia, diabetic polys, visual blurring or hyper / hypo glycemic episodes.  Heme/Lymph: No excessive bleeding, bruising or enlarged lymph nodes.  Physical Exam  BP 134/82   Pulse 76   Temp 97.7 F (36.5 C) (Temporal)    Resp 16   Ht 5\' 11"  (1.803 m)   Wt 268 lb 12.8 oz (121.9 kg)   SpO2 98%   BMI 37.49 kg/m   General Appearance: Over nourished and well groomed and in no apparent distress.  Eyes: PERRLA, EOMs, conjunctiva no swelling or erythema, normal fundi and vessels. Sinuses: No frontal/maxillary tenderness ENT/Mouth: EACs patent / TMs  nl. Nares clear without erythema, swelling, mucoid exudates. Oral hygiene is good. No erythema, swelling, or exudate. Tongue normal, non-obstructing. Tonsils not swollen or erythematous. Hearing normal.  Neck: Supple, thyroid not palpable. No bruits, nodes or JVD. Respiratory: Respiratory effort normal.  BS equal and clear bilateral without rales, rhonci, wheezing or stridor. Cardio: Heart sounds are normal with regular rate and rhythm and no murmurs, rubs or gallops. Peripheral pulses are normal and equal bilaterally without edema. No aortic or femoral bruits. Chest: symmetric with normal excursions and percussion.  Abdomen: Soft, with Nl bowel sounds. Nontender, no guarding, rebound, hernias,  masses, or organomegaly.  Lymphatics: Non tender without lymphadenopathy.  Musculoskeletal: Full ROM all peripheral extremities, joint stability, 5/5 strength, and normal gait. Skin: Warm and dry without rashes, lesions, cyanosis, clubbing or  ecchymosis. All #10 toenails severely thickened (1/8-1/4 "), dystrophic, chalky and yellow colored. Neuro: Cranial nerves intact, reflexes equal bilaterally. Normal muscle tone, no cerebellar symptoms. Sensation intact.  Pysch: Alert and oriented X 3 with normal affect, insight and judgment appropriate.   Assessment and Plan  1. Annual Preventative/Screening Exam   2. Essential hypertension  - EKG 12-Lead - [place], RETROPERITNL ABD,  LTD - CBC with Differential/Platelet - COMPLETE METABOLIC PANEL WITH GFR - Magnesium - TSH  3. Hyperlipidemia, mixed  - EKG 12-Lead - [place], RETROPERITNL ABD,  LTD - Lipid panel  4. Abnormal  glucose  - EKG 12-Lead - [place], RETROPERITNL ABD,  LTD - Hemoglobin A1c - Insulin, random  5. Vitamin D deficiency  - VITAMIN D 25 Hydroxy   6. Chronic renal failure, stage 3a (HCC)  - Urinalysis, Routine w reflex microscopic - Microalbumin / creatinine urine ratio - COMPLETE METABOLIC PANEL WITH GFR  7. Gastroesophageal reflux disease  - CBC with Differential/Platelet  8. History of prostate cancer   9. Morbid obesity (BMI 35+ with htn, hyperlipidemia, prediabetes)   - Lipid panel - TSH - Hemoglobin A1c  10. Onychomycosis of toenail   11. Screening for colorectal cancer  - Cologuard  12. Screening for ischemic heart disease  - EKG 12-Lead  13. FHx: heart disease  - EKG 12-Lead - [place], RETROPERITNL ABD,  LTD  14. Screening for AAA (aortic abdominal aneurysm)  - [place], RETROPERITNL ABD,  LTD  15. Medication management  - Microalbumin / creatinine urine ratio - CBC with Differential/Platelet - Magnesium - Lipid panel - TSH - Hemoglobin A1c - Insulin, random - VITAMIN D 25 Hydroxy          Patient was counseled in prudent diet, weight control to achieve/maintain BMI less than 25, BP monitoring, regular exercise and medications as discussed.  Discussed extended treatment for Onychomycosis of Toenails with Lamisil alternating 1 month on & 1 month off.  Also patient requests another taper of Decadron to try for his multiple joint pains. He declined referral to Rheumatology.  Discussed med effects and SE's. Routine screening labs and tests as requested with regular follow-up as recommended. Over 40 minutes of exam, counseling, chart review and high complex critical decision making was performed   Kirtland Bouchard, MD,

## 2020-06-13 ENCOUNTER — Ambulatory Visit (INDEPENDENT_AMBULATORY_CARE_PROVIDER_SITE_OTHER): Payer: Medicare HMO | Admitting: Internal Medicine

## 2020-06-13 ENCOUNTER — Other Ambulatory Visit: Payer: Self-pay

## 2020-06-13 VITALS — BP 134/82 | HR 76 | Temp 97.7°F | Resp 16 | Ht 71.0 in | Wt 268.8 lb

## 2020-06-13 DIAGNOSIS — Z8546 Personal history of malignant neoplasm of prostate: Secondary | ICD-10-CM

## 2020-06-13 DIAGNOSIS — N1831 Chronic kidney disease, stage 3a: Secondary | ICD-10-CM

## 2020-06-13 DIAGNOSIS — Z0000 Encounter for general adult medical examination without abnormal findings: Secondary | ICD-10-CM

## 2020-06-13 DIAGNOSIS — E559 Vitamin D deficiency, unspecified: Secondary | ICD-10-CM

## 2020-06-13 DIAGNOSIS — R7309 Other abnormal glucose: Secondary | ICD-10-CM

## 2020-06-13 DIAGNOSIS — E782 Mixed hyperlipidemia: Secondary | ICD-10-CM

## 2020-06-13 DIAGNOSIS — Z79899 Other long term (current) drug therapy: Secondary | ICD-10-CM

## 2020-06-13 DIAGNOSIS — Z8249 Family history of ischemic heart disease and other diseases of the circulatory system: Secondary | ICD-10-CM | POA: Diagnosis not present

## 2020-06-13 DIAGNOSIS — Z136 Encounter for screening for cardiovascular disorders: Secondary | ICD-10-CM | POA: Diagnosis not present

## 2020-06-13 DIAGNOSIS — I10 Essential (primary) hypertension: Secondary | ICD-10-CM | POA: Diagnosis not present

## 2020-06-13 DIAGNOSIS — K219 Gastro-esophageal reflux disease without esophagitis: Secondary | ICD-10-CM

## 2020-06-13 DIAGNOSIS — B351 Tinea unguium: Secondary | ICD-10-CM

## 2020-06-13 DIAGNOSIS — Z1211 Encounter for screening for malignant neoplasm of colon: Secondary | ICD-10-CM

## 2020-06-13 DIAGNOSIS — Z0001 Encounter for general adult medical examination with abnormal findings: Secondary | ICD-10-CM

## 2020-06-13 MED ORDER — TERBINAFINE HCL 250 MG PO TABS: ORAL_TABLET | ORAL | 1 refills | Status: DC

## 2020-06-13 MED ORDER — DEXAMETHASONE 4 MG PO TABS: ORAL_TABLET | ORAL | 0 refills | Status: DC

## 2020-06-13 NOTE — Progress Notes (Signed)
AortaScan < 3 cm. Within normal limits, per Dr McKeown. 

## 2020-06-14 DIAGNOSIS — M9904 Segmental and somatic dysfunction of sacral region: Secondary | ICD-10-CM | POA: Diagnosis not present

## 2020-06-14 DIAGNOSIS — M5136 Other intervertebral disc degeneration, lumbar region: Secondary | ICD-10-CM | POA: Diagnosis not present

## 2020-06-14 DIAGNOSIS — M9903 Segmental and somatic dysfunction of lumbar region: Secondary | ICD-10-CM | POA: Diagnosis not present

## 2020-06-14 DIAGNOSIS — M9905 Segmental and somatic dysfunction of pelvic region: Secondary | ICD-10-CM | POA: Diagnosis not present

## 2020-06-14 LAB — COMPLETE METABOLIC PANEL WITH GFR
AG Ratio: 1.5 (calc) (ref 1.0–2.5)
ALT: 21 U/L (ref 9–46)
AST: 20 U/L (ref 10–35)
Albumin: 4.3 g/dL (ref 3.6–5.1)
Alkaline phosphatase (APISO): 75 U/L (ref 35–144)
BUN/Creatinine Ratio: 14 (calc) (ref 6–22)
BUN: 19 mg/dL (ref 7–25)
CO2: 33 mmol/L — ABNORMAL HIGH (ref 20–32)
Calcium: 9.9 mg/dL (ref 8.6–10.3)
Chloride: 99 mmol/L (ref 98–110)
Creat: 1.4 mg/dL — ABNORMAL HIGH (ref 0.70–1.18)
GFR, Est African American: 58 mL/min/{1.73_m2} — ABNORMAL LOW (ref >=60)
GFR, Est Non African American: 50 mL/min/{1.73_m2} — ABNORMAL LOW (ref >=60)
Globulin: 2.9 g/dL (calc) (ref 1.9–3.7)
Glucose, Bld: 99 mg/dL (ref 65–99)
Potassium: 4.4 mmol/L (ref 3.5–5.3)
Sodium: 142 mmol/L (ref 135–146)
Total Bilirubin: 0.4 mg/dL (ref 0.2–1.2)
Total Protein: 7.2 g/dL (ref 6.1–8.1)

## 2020-06-14 LAB — URINALYSIS, ROUTINE W REFLEX MICROSCOPIC
Bilirubin Urine: NEGATIVE
Glucose, UA: NEGATIVE
Hgb urine dipstick: NEGATIVE
Ketones, ur: NEGATIVE
Leukocytes,Ua: NEGATIVE
Nitrite: NEGATIVE
Protein, ur: NEGATIVE
Specific Gravity, Urine: 1.032 (ref 1.001–1.03)
pH: <=5 (ref 5.0–8.0)

## 2020-06-14 LAB — CBC WITH DIFFERENTIAL/PLATELET
Absolute Monocytes: 792 cells/uL (ref 200–950)
Basophils Absolute: 52 cells/uL (ref 0–200)
Basophils Relative: 0.7 %
Eosinophils Absolute: 392 cells/uL (ref 15–500)
Eosinophils Relative: 5.3 %
HCT: 47.9 % (ref 38.5–50.0)
Hemoglobin: 16.3 g/dL (ref 13.2–17.1)
Lymphs Abs: 1658 cells/uL (ref 850–3900)
MCH: 30.9 pg (ref 27.0–33.0)
MCHC: 34 g/dL (ref 32.0–36.0)
MCV: 90.9 fL (ref 80.0–100.0)
MPV: 10.1 fL (ref 7.5–12.5)
Monocytes Relative: 10.7 %
Neutro Abs: 4507 cells/uL (ref 1500–7800)
Neutrophils Relative %: 60.9 %
Platelets: 257 10*3/uL (ref 140–400)
RBC: 5.27 10*6/uL (ref 4.20–5.80)
RDW: 12.1 % (ref 11.0–15.0)
Total Lymphocyte: 22.4 %
WBC: 7.4 10*3/uL (ref 3.8–10.8)

## 2020-06-14 LAB — MICROALBUMIN / CREATININE URINE RATIO
Creatinine, Urine: 239 mg/dL (ref 20–320)
Microalb Creat Ratio: 5 mcg/mg creat (ref <30)
Microalb, Ur: 1.1 mg/dL

## 2020-06-14 LAB — LIPID PANEL
Cholesterol: 170 mg/dL (ref <200)
HDL: 49 mg/dL (ref >=40)
LDL Cholesterol (Calc): 92 mg/dL (calc)
Non-HDL Cholesterol (Calc): 121 mg/dL (calc) (ref <130)
Total CHOL/HDL Ratio: 3.5 (calc) (ref <5.0)
Triglycerides: 194 mg/dL — ABNORMAL HIGH (ref <150)

## 2020-06-14 LAB — HEMOGLOBIN A1C
Hgb A1c MFr Bld: 6 % of total Hgb — ABNORMAL HIGH (ref <5.7)
Mean Plasma Glucose: 126 mg/dL
eAG (mmol/L): 7 mmol/L

## 2020-06-14 LAB — VITAMIN D 25 HYDROXY (VIT D DEFICIENCY, FRACTURES): Vit D, 25-Hydroxy: 82 ng/mL (ref 30–100)

## 2020-06-14 LAB — MAGNESIUM: Magnesium: 2.1 mg/dL (ref 1.5–2.5)

## 2020-06-14 LAB — INSULIN, RANDOM: Insulin: 61 u[IU]/mL — ABNORMAL HIGH

## 2020-06-14 LAB — TSH: TSH: 1.56 mIU/L (ref 0.40–4.50)

## 2020-06-14 NOTE — Progress Notes (Signed)
=========================================================== ===========================================================  -   Total Chol = 150 and LDL Chol = 92 - both  Excellent   - Very low risk for Heart Attack  / Stroke =========================================================== ===========================================================  - A1c = 6.0% - Blood sugar and A1c are    STILL   elevated in              the borderline and early or pre-diabetes range which has the same   300% increased risk for heart attack, stroke, cancer and                                Alzheimer- type Vascular Dementia as full blown diabetes.   But the good news is that diet, exercise with                                                weight loss can cure the early diabetes at this point. =========================================================== ===========================================================  - So . . . . . . . . . Marland Kitchen  - Avoid Sweets, Candy & White Stuff   - White Rice, White [place], White Flour  - Breads &  Pasta =========================================================== ===========================================================  - Vitamin D = 82 - Excellent       (Ideal or Goal is between 70-100  !  ) =========================================================== ===========================================================  - All Else - CBC - Kidneys - Electrolytes - Liver - Magnesium & Thyroid    - all  Normal / OK =========================================================== ===========================================================  - Keep up the [place] Work !  =========================================================== ===========================================================

## 2020-06-20 ENCOUNTER — Other Ambulatory Visit: Payer: Self-pay | Admitting: Adult Health

## 2020-06-21 DIAGNOSIS — M5136 Other intervertebral disc degeneration, lumbar region: Secondary | ICD-10-CM | POA: Diagnosis not present

## 2020-06-21 DIAGNOSIS — M9904 Segmental and somatic dysfunction of sacral region: Secondary | ICD-10-CM | POA: Diagnosis not present

## 2020-06-21 DIAGNOSIS — M9903 Segmental and somatic dysfunction of lumbar region: Secondary | ICD-10-CM | POA: Diagnosis not present

## 2020-06-21 DIAGNOSIS — M9905 Segmental and somatic dysfunction of pelvic region: Secondary | ICD-10-CM | POA: Diagnosis not present

## 2020-06-28 DIAGNOSIS — M5136 Other intervertebral disc degeneration, lumbar region: Secondary | ICD-10-CM | POA: Diagnosis not present

## 2020-06-28 DIAGNOSIS — M9903 Segmental and somatic dysfunction of lumbar region: Secondary | ICD-10-CM | POA: Diagnosis not present

## 2020-06-28 DIAGNOSIS — M9904 Segmental and somatic dysfunction of sacral region: Secondary | ICD-10-CM | POA: Diagnosis not present

## 2020-06-28 DIAGNOSIS — M9905 Segmental and somatic dysfunction of pelvic region: Secondary | ICD-10-CM | POA: Diagnosis not present

## 2020-07-05 DIAGNOSIS — M9905 Segmental and somatic dysfunction of pelvic region: Secondary | ICD-10-CM | POA: Diagnosis not present

## 2020-07-05 DIAGNOSIS — M9904 Segmental and somatic dysfunction of sacral region: Secondary | ICD-10-CM | POA: Diagnosis not present

## 2020-07-05 DIAGNOSIS — M5136 Other intervertebral disc degeneration, lumbar region: Secondary | ICD-10-CM | POA: Diagnosis not present

## 2020-07-05 DIAGNOSIS — M9903 Segmental and somatic dysfunction of lumbar region: Secondary | ICD-10-CM | POA: Diagnosis not present

## 2020-07-12 DIAGNOSIS — M9905 Segmental and somatic dysfunction of pelvic region: Secondary | ICD-10-CM | POA: Diagnosis not present

## 2020-07-12 DIAGNOSIS — M9904 Segmental and somatic dysfunction of sacral region: Secondary | ICD-10-CM | POA: Diagnosis not present

## 2020-07-12 DIAGNOSIS — M9903 Segmental and somatic dysfunction of lumbar region: Secondary | ICD-10-CM | POA: Diagnosis not present

## 2020-07-12 DIAGNOSIS — M5136 Other intervertebral disc degeneration, lumbar region: Secondary | ICD-10-CM | POA: Diagnosis not present

## 2020-07-19 DIAGNOSIS — M9903 Segmental and somatic dysfunction of lumbar region: Secondary | ICD-10-CM | POA: Diagnosis not present

## 2020-07-19 DIAGNOSIS — M9904 Segmental and somatic dysfunction of sacral region: Secondary | ICD-10-CM | POA: Diagnosis not present

## 2020-07-19 DIAGNOSIS — M5136 Other intervertebral disc degeneration, lumbar region: Secondary | ICD-10-CM | POA: Diagnosis not present

## 2020-07-19 DIAGNOSIS — M9905 Segmental and somatic dysfunction of pelvic region: Secondary | ICD-10-CM | POA: Diagnosis not present

## 2020-07-26 DIAGNOSIS — M9905 Segmental and somatic dysfunction of pelvic region: Secondary | ICD-10-CM | POA: Diagnosis not present

## 2020-07-26 DIAGNOSIS — M5136 Other intervertebral disc degeneration, lumbar region: Secondary | ICD-10-CM | POA: Diagnosis not present

## 2020-07-26 DIAGNOSIS — M9903 Segmental and somatic dysfunction of lumbar region: Secondary | ICD-10-CM | POA: Diagnosis not present

## 2020-07-26 DIAGNOSIS — M9904 Segmental and somatic dysfunction of sacral region: Secondary | ICD-10-CM | POA: Diagnosis not present

## 2020-08-02 DIAGNOSIS — M9903 Segmental and somatic dysfunction of lumbar region: Secondary | ICD-10-CM | POA: Diagnosis not present

## 2020-08-02 DIAGNOSIS — M9905 Segmental and somatic dysfunction of pelvic region: Secondary | ICD-10-CM | POA: Diagnosis not present

## 2020-08-02 DIAGNOSIS — M9904 Segmental and somatic dysfunction of sacral region: Secondary | ICD-10-CM | POA: Diagnosis not present

## 2020-08-02 DIAGNOSIS — M5136 Other intervertebral disc degeneration, lumbar region: Secondary | ICD-10-CM | POA: Diagnosis not present

## 2020-08-09 DIAGNOSIS — M5136 Other intervertebral disc degeneration, lumbar region: Secondary | ICD-10-CM | POA: Diagnosis not present

## 2020-08-09 DIAGNOSIS — M9905 Segmental and somatic dysfunction of pelvic region: Secondary | ICD-10-CM | POA: Diagnosis not present

## 2020-08-09 DIAGNOSIS — M9903 Segmental and somatic dysfunction of lumbar region: Secondary | ICD-10-CM | POA: Diagnosis not present

## 2020-08-09 DIAGNOSIS — M9904 Segmental and somatic dysfunction of sacral region: Secondary | ICD-10-CM | POA: Diagnosis not present

## 2020-08-16 DIAGNOSIS — M9905 Segmental and somatic dysfunction of pelvic region: Secondary | ICD-10-CM | POA: Diagnosis not present

## 2020-08-16 DIAGNOSIS — M5136 Other intervertebral disc degeneration, lumbar region: Secondary | ICD-10-CM | POA: Diagnosis not present

## 2020-08-16 DIAGNOSIS — M9904 Segmental and somatic dysfunction of sacral region: Secondary | ICD-10-CM | POA: Diagnosis not present

## 2020-08-16 DIAGNOSIS — M9903 Segmental and somatic dysfunction of lumbar region: Secondary | ICD-10-CM | POA: Diagnosis not present

## 2020-08-23 DIAGNOSIS — M9903 Segmental and somatic dysfunction of lumbar region: Secondary | ICD-10-CM | POA: Diagnosis not present

## 2020-08-23 DIAGNOSIS — M5136 Other intervertebral disc degeneration, lumbar region: Secondary | ICD-10-CM | POA: Diagnosis not present

## 2020-08-23 DIAGNOSIS — M9904 Segmental and somatic dysfunction of sacral region: Secondary | ICD-10-CM | POA: Diagnosis not present

## 2020-08-23 DIAGNOSIS — M9905 Segmental and somatic dysfunction of pelvic region: Secondary | ICD-10-CM | POA: Diagnosis not present

## 2020-08-30 DIAGNOSIS — M9905 Segmental and somatic dysfunction of pelvic region: Secondary | ICD-10-CM | POA: Diagnosis not present

## 2020-08-30 DIAGNOSIS — M9903 Segmental and somatic dysfunction of lumbar region: Secondary | ICD-10-CM | POA: Diagnosis not present

## 2020-08-30 DIAGNOSIS — M5136 Other intervertebral disc degeneration, lumbar region: Secondary | ICD-10-CM | POA: Diagnosis not present

## 2020-08-30 DIAGNOSIS — M9904 Segmental and somatic dysfunction of sacral region: Secondary | ICD-10-CM | POA: Diagnosis not present

## 2020-09-07 DIAGNOSIS — M9904 Segmental and somatic dysfunction of sacral region: Secondary | ICD-10-CM | POA: Diagnosis not present

## 2020-09-07 DIAGNOSIS — M9903 Segmental and somatic dysfunction of lumbar region: Secondary | ICD-10-CM | POA: Diagnosis not present

## 2020-09-07 DIAGNOSIS — M5136 Other intervertebral disc degeneration, lumbar region: Secondary | ICD-10-CM | POA: Diagnosis not present

## 2020-09-07 DIAGNOSIS — M9905 Segmental and somatic dysfunction of pelvic region: Secondary | ICD-10-CM | POA: Diagnosis not present

## 2020-09-13 DIAGNOSIS — M5136 Other intervertebral disc degeneration, lumbar region: Secondary | ICD-10-CM | POA: Diagnosis not present

## 2020-09-13 DIAGNOSIS — M9905 Segmental and somatic dysfunction of pelvic region: Secondary | ICD-10-CM | POA: Diagnosis not present

## 2020-09-13 DIAGNOSIS — M9904 Segmental and somatic dysfunction of sacral region: Secondary | ICD-10-CM | POA: Diagnosis not present

## 2020-09-13 DIAGNOSIS — M9903 Segmental and somatic dysfunction of lumbar region: Secondary | ICD-10-CM | POA: Diagnosis not present

## 2020-09-21 DIAGNOSIS — M272 Inflammatory conditions of jaws: Secondary | ICD-10-CM | POA: Diagnosis not present

## 2020-09-21 DIAGNOSIS — K122 Cellulitis and abscess of mouth: Secondary | ICD-10-CM | POA: Diagnosis not present

## 2020-10-11 DIAGNOSIS — M9904 Segmental and somatic dysfunction of sacral region: Secondary | ICD-10-CM | POA: Diagnosis not present

## 2020-10-11 DIAGNOSIS — M9905 Segmental and somatic dysfunction of pelvic region: Secondary | ICD-10-CM | POA: Diagnosis not present

## 2020-10-11 DIAGNOSIS — M5136 Other intervertebral disc degeneration, lumbar region: Secondary | ICD-10-CM | POA: Diagnosis not present

## 2020-10-11 DIAGNOSIS — M9903 Segmental and somatic dysfunction of lumbar region: Secondary | ICD-10-CM | POA: Diagnosis not present

## 2020-10-25 DIAGNOSIS — M5136 Other intervertebral disc degeneration, lumbar region: Secondary | ICD-10-CM | POA: Diagnosis not present

## 2020-10-25 DIAGNOSIS — M9905 Segmental and somatic dysfunction of pelvic region: Secondary | ICD-10-CM | POA: Diagnosis not present

## 2020-10-25 DIAGNOSIS — M9903 Segmental and somatic dysfunction of lumbar region: Secondary | ICD-10-CM | POA: Diagnosis not present

## 2020-10-25 DIAGNOSIS — M9904 Segmental and somatic dysfunction of sacral region: Secondary | ICD-10-CM | POA: Diagnosis not present

## 2020-11-08 DIAGNOSIS — M9903 Segmental and somatic dysfunction of lumbar region: Secondary | ICD-10-CM | POA: Diagnosis not present

## 2020-11-08 DIAGNOSIS — M5136 Other intervertebral disc degeneration, lumbar region: Secondary | ICD-10-CM | POA: Diagnosis not present

## 2020-11-08 DIAGNOSIS — M9904 Segmental and somatic dysfunction of sacral region: Secondary | ICD-10-CM | POA: Diagnosis not present

## 2020-11-08 DIAGNOSIS — M9905 Segmental and somatic dysfunction of pelvic region: Secondary | ICD-10-CM | POA: Diagnosis not present

## 2020-11-22 DIAGNOSIS — M5136 Other intervertebral disc degeneration, lumbar region: Secondary | ICD-10-CM | POA: Diagnosis not present

## 2020-11-22 DIAGNOSIS — M9905 Segmental and somatic dysfunction of pelvic region: Secondary | ICD-10-CM | POA: Diagnosis not present

## 2020-11-22 DIAGNOSIS — M9904 Segmental and somatic dysfunction of sacral region: Secondary | ICD-10-CM | POA: Diagnosis not present

## 2020-11-22 DIAGNOSIS — M9903 Segmental and somatic dysfunction of lumbar region: Secondary | ICD-10-CM | POA: Diagnosis not present

## 2020-12-06 DIAGNOSIS — M9903 Segmental and somatic dysfunction of lumbar region: Secondary | ICD-10-CM | POA: Diagnosis not present

## 2020-12-06 DIAGNOSIS — M9904 Segmental and somatic dysfunction of sacral region: Secondary | ICD-10-CM | POA: Diagnosis not present

## 2020-12-06 DIAGNOSIS — M9905 Segmental and somatic dysfunction of pelvic region: Secondary | ICD-10-CM | POA: Diagnosis not present

## 2020-12-06 DIAGNOSIS — M5136 Other intervertebral disc degeneration, lumbar region: Secondary | ICD-10-CM | POA: Diagnosis not present

## 2020-12-20 DIAGNOSIS — M9904 Segmental and somatic dysfunction of sacral region: Secondary | ICD-10-CM | POA: Diagnosis not present

## 2020-12-20 DIAGNOSIS — M9903 Segmental and somatic dysfunction of lumbar region: Secondary | ICD-10-CM | POA: Diagnosis not present

## 2020-12-20 DIAGNOSIS — M5136 Other intervertebral disc degeneration, lumbar region: Secondary | ICD-10-CM | POA: Diagnosis not present

## 2020-12-20 DIAGNOSIS — M9905 Segmental and somatic dysfunction of pelvic region: Secondary | ICD-10-CM | POA: Diagnosis not present

## 2020-12-20 NOTE — Progress Notes (Deleted)
MEDICARE ANNUAL WELLNESS VISIT AND FOLLOW UP Assessment:   [name] was seen today for medicare wellness and follow-up.  Diagnoses and all orders for this visit:  Annual Medicare Wellness Visit Due annually  Health maintenance reviewed ***  Essential hypertension Continue medication; typically well controlled Monitor blood pressure at home; call if consistently over 130/80 Continue DASH diet.   Reminder to go to the ER if any CP, SOB, nausea, dizziness, severe HA, changes vision/speech, left arm numbness and tingling and jaw pain. -     CBC with Differential/Platelet -     COMPLETE METABOLIC PANEL WITH GFR -     Magnesium  Gastroesophageal reflux disease, unspecified whether esophagitis present Well managed by PPI and lifestyle Discussed diet, avoiding triggers and other lifestyle changes  Prostate cancer [facility]) S/p radiation, urology following ***  CKD Stage II  Increase fluids, avoid NSAIDS, monitor sugars, will monitor -     COMPLETE METABOLIC PANEL WITH GFR  BPH with obstruction/lower urinary tract symptoms Monitor; urology following  Vitamin D deficiency At goal at recent check; continue to recommend supplementation for goal of 60-100 Defer vitamin D level  Other abnormal glucose (prediabetes) Discussed disease and risks Discussed diet/exercise, weight management  A1C q68m;   Morbid obesity (BMI 35+ with htn, hyperlipidemia, prediabetes)  Long discussion about weight loss, diet, and exercise Recommended diet heavy in fruits and veggies and low in animal meats, cheeses, and dairy products, appropriate calorie intake Patient will work on reducing portions, making small sustainable changes Discussed appropriate weight for height and initial goal (<250 lb) *** Follow up at next visit  Hyperlipidemia, mixed Recently well controlled off of medications Continue low cholesterol diet and exercise, weight loss encouraged Check lipid panel.  -     Lipid panel -      TSH  Onychomycosis of toenail Good progress with lamisil; check LFTs; monitor after completing course for 6 months ***  Over 30 minutes of exam, counseling, chart review, and critical decision making was performed  Future Appointments  Date Time Provider [place]  12/21/2020  2:30 PM Liane Comber, NP [facility] None  06/14/2021  9:00 AM Unk Pinto, MD [facility] None     Plan:   During the course of the visit the patient was educated and counseled about appropriate screening and preventive services including:   Pneumococcal vaccine  Influenza vaccine Prevnar 13 Td vaccine Screening electrocardiogram Colorectal cancer screening Diabetes screening Glaucoma screening Nutrition counseling    Subjective:  Jason Patton. is a 70 y.o. male who presents for Medicare Annual Wellness Visit and 3 month follow up for HTN, hyperlipidemia, prediabetes, obesity and vitamin D Def.   He is doing 2nd course of terbinafine for bilateral foot onychomycosis and reports much improved and seems to be growing out.   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  Admits wife is a very good cook, this limits him for weight Wt Readings from Last 3 Encounters:  06/13/20 268 lb 12.8 oz (121.9 kg)  03/15/20 268 lb (121.6 kg)  12/14/19 274 lb 3.2 oz (124.4 kg)   His blood pressure has been controlled at home, today their BP is   He does not workout. He denies chest pain, shortness of breath, dizziness.   He is not on cholesterol  medication and denies myalgias. His cholesterol is at goal. The cholesterol last visit was:   Lab Results  Component Value Date   CHOL 170 06/13/2020   HDL 49 06/13/2020   LDLCALC 92 06/13/2020   TRIG 194 (H) 06/13/2020   CHOLHDL 3.5 06/13/2020   He has not been  working on diet and exercise for prediabetes, and denies increased appetite, nausea, paresthesia of the feet, polydipsia, polyuria and visual disturbances. Last A1C in the office was:  Lab Results  Component Value Date   HGBA1C 6.0 (H) 06/13/2020   Last GFR Lab Results  Component Value Date   GFRNONAA 50 (L) 06/13/2020    Patient is on Vitamin D supplement.   Lab Results  Component Value Date   VD25OH 54 06/13/2020     Patient was diagnosed with Prostate Cancer in 2018 and was treated by 40 Radiation sessions July 5 thru Aug 29 and is followed by Dr. Louis Meckel by q36m PSAs which have been low/stable. Last seen ***? Last OV in our system 05/2019 *** Lab Results  Component Value Date   PSA 0.2 06/04/2019   PSA 0.4 10/30/2018   PSA 5.3 (H) 01/17/2016     Medication Review:  Current Outpatient Medications (Endocrine & Metabolic):    dexamethasone (DECADRON) 4 MG tablet, Take 1 tab 3 x day - 3 days, then 2 x day - 3 days, then 1 tab daily  Current Outpatient Medications (Cardiovascular):    bisoprolol-hydrochlorothiazide (ZIAC) 10-6.25 MG tablet, TAKE 1 TABLET BY MOUTH DAILY FOR BLOOD PRESSURE   losartan (COZAAR) 50 MG tablet, TAKE 1 TABLET DAILY FOR BP   Current Outpatient Medications (Analgesics):    aspirin 81 MG tablet, Take 162 mg by mouth daily.   Current Outpatient Medications (Hematological):    vitamin B-12 (CYANOCOBALAMIN) 50 MCG tablet, Take 50 mcg by mouth. Takes 1 tablet every other day.  Current Outpatient Medications (Other):    Cholecalciferol (VITAMIN D3) 5000 UNITS CAPS, Take 5,000 Units by mouth daily.   omeprazole (PRILOSEC) 40 MG capsule, TAKE 1 CAPSULE 2 TMIES A DAY FOR FOR INDIGESTION & HEARTBURN (Patient taking differently: TAKE 1 CAPSULE Monday, Wednesday AND Friday FOR INDIGESTION & HEARTBURN)   terbinafine (LAMISIL) 250 MG tablet, Take 1 tablet Daily for 1 month-  alternating 1 month on & 1 month off  Allergies: Allergies  Allergen Reactions   Ace  Inhibitors Other (See Comments)    Cough    Citalopram Nausea And Vomiting    Current Problems (verified) has Essential hypertension; Hyperlipidemia, mixed; Vitamin D deficiency; Abnormal glucose; CKD Stage II ; Morbid obesity (BMI 35+ with htn, hyperlipidemia, prediabetes) ; Prostate cancer ([place]); Gastroesophageal reflux disease; FHx: heart disease; and BPH with obstruction/lower urinary tract symptoms on their problem list.  Screening Tests Health Maintenance  Topic Date Due   Zoster Vaccines- Shingrix (1 of 2) Never done   Fecal DNA (Cologuard)  12/22/2016   COVID-19 Vaccine (3 - Moderna risk series) 05/27/2019   INFLUENZA VACCINE  10/03/2020   Hepatitis C Screening  Completed   HPV VACCINES  Aged Out   TETANUS/TDAP  Discontinued    Immunization History  Administered Date(s) Administered   Influenza Split 12/23/2013   Influenza, High Dose Seasonal PF 11/25/2014, 03/20/2017, 02/04/2019, 12/14/2019   Influenza, Seasonal, Injecte, Preservative Fre 01/17/2016   Moderna Sars-Covid-2 Vaccination 04/01/2019, 04/29/2019   Pneumococcal Polysaccharide-23 12/01/2008, 09/10/2019   Tdap 12/01/2008    Preventative care: Last colonoscopy: never Last cologuard: 2015 OVERDUE - will order today ***  Prior vaccinations: TD or Tdap: 2010 will get PRN *** Influenza: 02/2019 *** Pneumococcal: 2010, 2021 Prevnar13: declines  Shingles/Zostavax: declines Covid 19: 2/2, 2021, moderna  Names of Other Physician/Practitioners you currently use: 1. [facility] here for primary care 2. - , eye doctor, last visit remote  3. Dr. Vanessa Kick, dentist, last visit, 2021  Patient Care Team: Unk Pinto, MD as PCP - General (Internal Medicine) Ardis Hughs, MD as Attending Physician (Urology)  Surgical: He  has a past surgical history that includes Hemorrhoid surgery; Tonsillectomy; and Cystoscopy/retrograde/ureteroscopy (Left, 01/02/2013). Family His  family history includes Cancer in his mother; Diabetes in his father; Heart disease in his father. Social history  He reports that he has never smoked. He has never used smokeless tobacco. He reports that he does not drink alcohol and does not use drugs.  MEDICARE WELLNESS OBJECTIVES: Physical activity:   Cardiac risk factors:   Depression/mood screen:   Depression screen [facility] 2/9 06/12/2020  Decreased Interest 0  Down, Depressed, Hopeless 0  PHQ - 2 Score 0    ADLs:  In your present state of health, do you have any difficulty performing the following activities: 06/12/2020  Hearing? N  Vision? N  Difficulty concentrating or making decisions? N  Walking or climbing stairs? N  Dressing or bathing? N  Doing errands, shopping? N  Some recent data might be hidden     Cognitive Testing  Alert? Yes  Normal Appearance?Yes  Oriented to person? Yes  Place? Yes   Time? Yes  Recall of three objects?  Yes  Can perform simple calculations? Yes  Displays appropriate judgment?Yes  Can read the correct time from a watch face?Yes  EOL planning:     Objective:   There were no vitals filed for this visit.  There is no height or weight on file to calculate BMI.  General appearance: alert, no distress, WD/WN, male HEENT: normocephalic, sclerae anicteric, TMs pearly, nares patent, no discharge or erythema, pharynx normal Oral cavity: MMM, no lesions Neck: supple, no lymphadenopathy, no thyromegaly, no masses Heart: RRR, normal S1, S2, no murmurs Lungs: CTA bilaterally, no wheezes, rhonchi, or rales Abdomen: +bs, soft, obese, non tender, non distended, no masses, no hepatomegaly, no splenomegaly Musculoskeletal: nontender, no swelling, no obvious deformity Extremities: no edema, no cyanosis, no clubbing Pulses: 2+ symmetric, upper and lower extremities, normal cap refill Neurological: alert, oriented x 3, CN2-12 intact, strength normal upper extremities and lower extremities, sensation  normal throughout, DTRs 2+ throughout, no cerebellar signs, gait normal Psychiatric: normal affect, behavior normal, pleasant Skin: warm/dry, intact, without concerning rashes, ecchymosis; bil toe nails with normal bases, thick/yellow and brittle distally  ***  Medicare Attestation I have personally reviewed: The patient's medical and social history Their use of alcohol, tobacco or illicit drugs Their current medications and supplements The patient's functional ability including ADLs,fall risks, home safety risks, cognitive, and hearing and visual impairment Diet and physical activities Evidence for depression or mood disorders  The patient's weight, height, BMI, and visual acuity have been recorded in the chart.  I have made referrals, counseling, and provided education to the patient based on review of the above and I have provided the patient with a written personalized care plan for preventive services.     Izora Ribas, NP   12/20/2020

## 2020-12-21 ENCOUNTER — Other Ambulatory Visit: Payer: Self-pay

## 2020-12-21 ENCOUNTER — Encounter: Payer: Self-pay | Admitting: Adult Health

## 2020-12-21 ENCOUNTER — Ambulatory Visit (INDEPENDENT_AMBULATORY_CARE_PROVIDER_SITE_OTHER): Payer: Medicare HMO | Admitting: Adult Health

## 2020-12-21 VITALS — BP 130/80 | HR 71 | Temp 97.5°F | Wt 270.0 lb

## 2020-12-21 DIAGNOSIS — M25552 Pain in left hip: Secondary | ICD-10-CM

## 2020-12-21 DIAGNOSIS — R6889 Other general symptoms and signs: Secondary | ICD-10-CM | POA: Diagnosis not present

## 2020-12-21 DIAGNOSIS — N138 Other obstructive and reflux uropathy: Secondary | ICD-10-CM

## 2020-12-21 DIAGNOSIS — K219 Gastro-esophageal reflux disease without esophagitis: Secondary | ICD-10-CM | POA: Diagnosis not present

## 2020-12-21 DIAGNOSIS — N401 Enlarged prostate with lower urinary tract symptoms: Secondary | ICD-10-CM

## 2020-12-21 DIAGNOSIS — I10 Essential (primary) hypertension: Secondary | ICD-10-CM | POA: Diagnosis not present

## 2020-12-21 DIAGNOSIS — Z23 Encounter for immunization: Secondary | ICD-10-CM | POA: Diagnosis not present

## 2020-12-21 DIAGNOSIS — Z96642 Presence of left artificial hip joint: Secondary | ICD-10-CM | POA: Insufficient documentation

## 2020-12-21 DIAGNOSIS — Z0000 Encounter for general adult medical examination without abnormal findings: Secondary | ICD-10-CM

## 2020-12-21 DIAGNOSIS — M7072 Other bursitis of hip, left hip: Secondary | ICD-10-CM

## 2020-12-21 DIAGNOSIS — R7309 Other abnormal glucose: Secondary | ICD-10-CM | POA: Diagnosis not present

## 2020-12-21 DIAGNOSIS — Z0001 Encounter for general adult medical examination with abnormal findings: Secondary | ICD-10-CM | POA: Diagnosis not present

## 2020-12-21 DIAGNOSIS — N1831 Chronic kidney disease, stage 3a: Secondary | ICD-10-CM

## 2020-12-21 DIAGNOSIS — C61 Malignant neoplasm of prostate: Secondary | ICD-10-CM

## 2020-12-21 DIAGNOSIS — N182 Chronic kidney disease, stage 2 (mild): Secondary | ICD-10-CM | POA: Diagnosis not present

## 2020-12-21 DIAGNOSIS — E559 Vitamin D deficiency, unspecified: Secondary | ICD-10-CM

## 2020-12-21 DIAGNOSIS — Z1211 Encounter for screening for malignant neoplasm of colon: Secondary | ICD-10-CM

## 2020-12-21 DIAGNOSIS — E782 Mixed hyperlipidemia: Secondary | ICD-10-CM | POA: Diagnosis not present

## 2020-12-21 MED ORDER — MELOXICAM 15 MG PO TABS: ORAL_TABLET | ORAL | 1 refills | Status: DC

## 2020-12-21 NOTE — Patient Instructions (Signed)

## 2020-12-21 NOTE — Progress Notes (Signed)
MEDICARE ANNUAL WELLNESS VISIT AND FOLLOW UP Assessment:   Jason Patton was seen today for medicare wellness and follow-up.  Diagnoses and all orders for this visit:  Annual Medicare Wellness Visit Due annually  Health maintenance reviewed  Essential hypertension Continue medication; typically well controlled Monitor blood pressure at home; call if consistently over 130/80 Continue DASH diet.   Reminder to go to the ER if any CP, SOB, nausea, dizziness, severe HA, changes vision/speech, left arm numbness and tingling and jaw pain. -     CBC with Differential/Platelet -     COMPLETE METABOLIC PANEL WITH GFR -     Magnesium  Gastroesophageal reflux disease, unspecified whether esophagitis present Well managed by PPI and lifestyle Discussed diet, avoiding triggers and other lifestyle changes  Prostate cancer [facility]) S/p radiation, urology following  Overdue PSA/follow up - ordered today   CKD Stage III Increase fluids, avoid NSAIDS, monitor sugars, will monitor -     COMPLETE METABOLIC PANEL WITH GFR  BPH with obstruction/lower urinary tract symptoms Monitor; urology following  Vitamin D deficiency At goal at recent check; continue to recommend supplementation for goal of 60-100 Defer vitamin D level  Other abnormal glucose (prediabetes) Discussed disease and risks Discussed diet/exercise, weight management  A1C q52m;   Morbid obesity (BMI 35+ with htn, hyperlipidemia, prediabetes)  Long discussion about weight loss, diet, and exercise Recommended diet heavy in fruits and veggies and low in animal meats, cheeses, and dairy products, appropriate calorie intake Patient will work on reducing portions, making small sustainable changes Discussed appropriate weight for height and initial goal (<250 lb)  Follow up at next visit  Hyperlipidemia, mixed Recently well controlled off of medications Continue low cholesterol diet and exercise, weight loss encouraged Check lipid panel.  -      Lipid panel -     TSH  Onychomycosis of toenail Good progress with lamisil; check LFTs; monitor after completing course for 6 months.   Right hip pain/ Hip trochanter bursitis Exam suggestive of bursitis, however with reduced ROM suggestive of possible underlying arthritic changes. He declines imaging at this time, would like to try conservative treatment. Given meloxicam script and exercises for hip bursitis.  Follow up in 4-6 weeks if not resolving.    Over 30 minutes of exam, counseling, chart review, and critical decision making was performed  Future Appointments  Date Time Provider [place]  06/14/2021  9:00 AM Unk Pinto, MD [facility] None  12/21/2021  2:00 PM Liane Comber, NP [facility] None     Plan:   During the course of the visit the patient was educated and counseled about appropriate screening and preventive services including:   Pneumococcal vaccine  Influenza vaccine Prevnar 13 Td vaccine Screening electrocardiogram Colorectal cancer screening Diabetes screening Glaucoma screening Nutrition counseling    Subjective:  Jason Dom. is a 70 y.o. male who presents for Medicare Annual Wellness Visit and 3 month follow up for HTN, hyperlipidemia, prediabetes, obesity and vitamin D Def.   He completed 2nd course of terbinafine for bilateral foot onychomycosis and reports much improved and seems to be growing out.   He had R top #1 molar removed with complication, has been on abx for 4 months via dentist Dr. Wonda Horner, most recently has been referred to ENT specialist at [facility].    He reports several months of intermittent lateral R hip pain, with rolling over, with flexion, some discomfort with walking. Has been working with chiropractor without resolution. Has taken aleve 3  tabs in AM last few days with some benefit.   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  Admits wife is a very good cook, this limits him for weight Wt Readings from Last 3 Encounters:  12/21/20 270 lb (122.5 kg)  06/13/20 268 lb 12.8 oz (121.9 kg)  03/15/20 268 lb (121.6 kg)   His blood pressure has been controlled at home, today their BP is BP: 130/80 He does not workout. He denies chest pain, shortness of breath, dizziness.   He is not on cholesterol medication and denies myalgias. His cholesterol is at goal. The cholesterol last visit was:   Lab Results  Component Value Date   CHOL 170 06/13/2020   HDL 49 06/13/2020   LDLCALC 92 06/13/2020   TRIG 194 (H) 06/13/2020   CHOLHDL 3.5 06/13/2020   He has not been working on diet and exercise for prediabetes, and denies increased appetite, nausea, paresthesia of the feet, polydipsia, polyuria and visual disturbances. Last A1C in the office was:  Lab Results  Component Value Date   HGBA1C 6.0 (H) 06/13/2020   He has stable CKD III range. Last GFR Lab Results  Component Value Date   GFRNONAA 50 (L) 06/13/2020   GFRNONAA 59 (L) 03/15/2020   GFRNONAA 59 (L) 12/14/2019    Patient is on Vitamin D supplement.   Lab Results  Component Value Date   VD25OH 11 06/13/2020     Patient was diagnosed with Prostate Cancer in 2018 and was treated by 40 Radiation sessions July 5 thru Aug 29 and was followed by Dr. Louis Meckel however hasn't followed up since 05/2019 per notes in media. Patient agrees hasn't had PSA check since that time.   Lab Results  Component Value Date   PSA 0.2 06/04/2019   PSA 0.4 10/30/2018   PSA 5.3 (H) 01/17/2016     Medication Review:   Current Outpatient Medications (Cardiovascular):    bisoprolol-hydrochlorothiazide (ZIAC) 10-6.25 MG tablet, TAKE 1 TABLET BY MOUTH DAILY FOR BLOOD PRESSURE   losartan (COZAAR) 50 MG tablet, TAKE 1 TABLET DAILY FOR BP   Current Outpatient Medications (Analgesics):     aspirin 81 MG tablet, Take 162 mg by mouth daily.    meloxicam (MOBIC) 15 MG tablet, Take one daily with food for 2 weeks, can take with tylenol, can not take with aleve, iburpofen, then as needed daily for pain  Current Outpatient Medications (Hematological):    vitamin B-12 (CYANOCOBALAMIN) 50 MCG tablet, Take 50 mcg by mouth. Takes 1 tablet every other day.  Current Outpatient Medications (Other):    Cholecalciferol (VITAMIN D3) 5000 UNITS CAPS, Take 5,000 Units by mouth daily.   clindamycin (CLEOCIN) 150 MG capsule, Take by mouth 4 (four) times daily.   omeprazole (PRILOSEC) 40 MG capsule, TAKE 1 CAPSULE 2 TMIES A DAY FOR FOR INDIGESTION & HEARTBURN (Patient taking differently: TAKE 1 CAPSULE Monday, Wednesday AND Friday FOR INDIGESTION & HEARTBURN)   terbinafine (LAMISIL) 250 MG tablet, Take 1 tablet Daily for 1 month-  alternating 1 month on & 1 month off  Allergies: Allergies  Allergen Reactions   Ace Inhibitors Other (See Comments)    Cough    Citalopram Nausea And Vomiting    Current Problems (verified) has Essential hypertension; Hyperlipidemia, mixed; Vitamin D deficiency; Abnormal glucose; CKD (chronic kidney disease)  stage 3, GFR 30-59 ml/min ([place]); Morbid obesity (BMI 35+ with htn, hyperlipidemia, prediabetes) ; Prostate cancer ([place]); Gastroesophageal reflux disease; FHx: heart disease; BPH with obstruction/lower urinary tract symptoms; and Bursitis of left hip on their problem list.  Screening Tests Health Maintenance  Topic Date Due   Zoster Vaccines- Shingrix (1 of 2) Never done   Fecal DNA (Cologuard)  12/22/2016   COVID-19 Vaccine (3 - Moderna risk series) 05/27/2019   Pneumonia Vaccine 58+ Years old (3 - PCV) 09/09/2020   INFLUENZA VACCINE  Completed   Hepatitis C Screening  Completed   HPV VACCINES  Aged Out   TETANUS/TDAP  Discontinued    Immunization History  Administered Date(s) Administered   Influenza Split 12/23/2013   Influenza, High Dose Seasonal  PF 11/25/2014, 03/20/2017, 02/04/2019, 12/14/2019, 12/21/2020   Influenza, Seasonal, Injecte, Preservative Fre 01/17/2016   Moderna Sars-Covid-2 Vaccination 04/01/2019, 04/29/2019   Pneumococcal Polysaccharide-23 12/01/2008, 09/10/2019   Tdap 12/01/2008    Preventative care: Last colonoscopy: never Last cologuard: 2015 OVERDUE - will order today   Prior vaccinations: TD or Tdap: 2010 will get PRN  Influenza: 12/2019 TODAY  Pneumococcal: 2010, 2021 Prevnar13: declines  Shingles/Zostavax: declines Covid 19: 2/2, 2021, moderna  Names of Other Physician/Practitioners you currently use: 1. [facility] here for primary care 2. - , eye doctor, last visit remote  3. Dr. Vanessa Kick, dentist, last visit, 2022  Patient Care Team: Unk Pinto, MD as PCP - General (Internal Medicine) Ardis Hughs, MD as Attending Physician (Urology)  Surgical: He  has a past surgical history that includes Hemorrhoid surgery; Tonsillectomy; and Cystoscopy/retrograde/ureteroscopy (Left, 01/02/2013). Family His family history includes Cancer in his mother; Diabetes in his father; Heart disease in his father. Social history  He reports that he has never smoked. He has never used smokeless tobacco. He reports that he does not drink alcohol and does not use drugs.  MEDICARE WELLNESS OBJECTIVES: Physical activity: Current Exercise Habits: The patient has a physically strenuous job, but has no regular exercise apart from work., Exercise limited by: None identified Cardiac risk factors: Cardiac Risk Factors include: advanced age (>56men, >54 women);dyslipidemia;hypertension;male gender;obesity (BMI >30kg/m2) Depression/mood screen:   Depression screen [facility] 2/9 12/21/2020  Decreased Interest 0  Down, Depressed, Hopeless 0  PHQ - 2 Score 0    ADLs:  In your present state of health, do you have any difficulty performing the following activities: 12/21/2020 06/12/2020   Hearing? N N  Vision? N N  Difficulty concentrating or making decisions? N N  Walking or climbing stairs? N N  Dressing or bathing? N N  Doing errands, shopping? N N  Some recent data might be hidden     Cognitive Testing  Alert? Yes  Normal Appearance?Yes  Oriented to person? Yes  Place? Yes   Time? Yes  Recall of three objects?  Yes  Can perform simple calculations? Yes  Displays appropriate judgment?Yes  Can read the correct time from a watch face?Yes  EOL planning: Does Patient Have a Medical Advance Directive?: Yes Type of Advance Directive: Healthcare Power of Attorney, Living will Does patient want to make changes to medical advance directive?: No - Patient declined Copy of [place] in Chart?: No - copy requested   Objective:   Today's Vitals   12/21/20 1442  BP: 130/80  Pulse: 71  Temp: (!) 97.5 F (36.4 C)  SpO2: 99%  Weight: 270 lb (122.5 kg)    Body mass index is  37.66 kg/m.  General appearance: alert, no distress, WD/WN, male HEENT: normocephalic, sclerae anicteric, TMs pearly, nares patent, no discharge or erythema, pharynx normal Oral cavity: MMM, no lesions Neck: supple, no lymphadenopathy, no thyromegaly, no masses Heart: RRR, normal S1, S2, no murmurs Lungs: CTA bilaterally, no wheezes, rhonchi, or rales Abdomen: +bs, soft, obese, non tender, non distended, no masses, no hepatomegaly, no splenomegaly Musculoskeletal: no swelling, no obvious deformity; left hip with reduced flexion, internal rotation, external rotation. Lateral tenderness over greater trochanter.  Extremities: no edema, no cyanosis, no clubbing Pulses: 2+ symmetric, upper and lower extremities, normal cap refill Neurological: alert, oriented x 3, CN2-12 intact, strength normal upper extremities and lower extremities, sensation normal throughout, DTRs 2+ throughout, no cerebellar signs, gait mildly antalgic Psychiatric: normal affect, behavior normal,  pleasant Skin: warm/dry, intact, without concerning rashes, ecchymosis; bil toe nails with normal bases, thick/yellow and brittle distally   Medicare Attestation I have personally reviewed: The patient's medical and social history Their use of alcohol, tobacco or illicit drugs Their current medications and supplements The patient's functional ability including ADLs,fall risks, home safety risks, cognitive, and hearing and visual impairment Diet and physical activities Evidence for depression or mood disorders  The patient's weight, height, BMI, and visual acuity have been recorded in the chart.  I have made referrals, counseling, and provided education to the patient based on review of the above and I have provided the patient with a written personalized care plan for preventive services.     Izora Ribas, NP   12/21/2020

## 2020-12-22 LAB — COMPLETE METABOLIC PANEL WITH GFR
AG Ratio: 1.4 (calc) (ref 1.0–2.5)
ALT: 21 U/L (ref 9–46)
AST: 21 U/L (ref 10–35)
Albumin: 4 g/dL (ref 3.6–5.1)
Alkaline phosphatase (APISO): 80 U/L (ref 35–144)
BUN: 20 mg/dL (ref 7–25)
CO2: 31 mmol/L (ref 20–32)
Calcium: 9.4 mg/dL (ref 8.6–10.3)
Chloride: 101 mmol/L (ref 98–110)
Creat: 1.21 mg/dL (ref 0.70–1.28)
Globulin: 2.8 g/dL (calc) (ref 1.9–3.7)
Glucose, Bld: 93 mg/dL (ref 65–99)
Potassium: 4.2 mmol/L (ref 3.5–5.3)
Sodium: 139 mmol/L (ref 135–146)
Total Bilirubin: 0.5 mg/dL (ref 0.2–1.2)
Total Protein: 6.8 g/dL (ref 6.1–8.1)
eGFR: 64 mL/min/{1.73_m2} (ref >=60)

## 2020-12-22 LAB — CBC WITH DIFFERENTIAL/PLATELET
Absolute Monocytes: 850 cells/uL (ref 200–950)
Basophils Absolute: 48 cells/uL (ref 0–200)
Basophils Relative: 0.7 %
Eosinophils Absolute: 340 cells/uL (ref 15–500)
Eosinophils Relative: 5 %
HCT: 47 % (ref 38.5–50.0)
Hemoglobin: 16 g/dL (ref 13.2–17.1)
Lymphs Abs: 1884 cells/uL (ref 850–3900)
MCH: 30.3 pg (ref 27.0–33.0)
MCHC: 34 g/dL (ref 32.0–36.0)
MCV: 89 fL (ref 80.0–100.0)
MPV: 10 fL (ref 7.5–12.5)
Monocytes Relative: 12.5 %
Neutro Abs: 3679 cells/uL (ref 1500–7800)
Neutrophils Relative %: 54.1 %
Platelets: 259 10*3/uL (ref 140–400)
RBC: 5.28 10*6/uL (ref 4.20–5.80)
RDW: 12 % (ref 11.0–15.0)
Total Lymphocyte: 27.7 %
WBC: 6.8 10*3/uL (ref 3.8–10.8)

## 2020-12-22 LAB — HEMOGLOBIN A1C
Hgb A1c MFr Bld: 5.8 % of total Hgb — ABNORMAL HIGH (ref <5.7)
Mean Plasma Glucose: 120 mg/dL
eAG (mmol/L): 6.6 mmol/L

## 2020-12-22 LAB — PSA: PSA: 0.37 ng/mL (ref <=4.00)

## 2020-12-22 LAB — LIPID PANEL
Cholesterol: 148 mg/dL (ref <200)
HDL: 45 mg/dL (ref >=40)
LDL Cholesterol (Calc): 87 mg/dL (calc)
Non-HDL Cholesterol (Calc): 103 mg/dL (calc) (ref <130)
Total CHOL/HDL Ratio: 3.3 (calc) (ref <5.0)
Triglycerides: 71 mg/dL (ref <150)

## 2020-12-22 LAB — TSH: TSH: 1.97 mIU/L (ref 0.40–4.50)

## 2020-12-22 LAB — MAGNESIUM: Magnesium: 2 mg/dL (ref 1.5–2.5)

## 2020-12-23 ENCOUNTER — Other Ambulatory Visit: Payer: Self-pay | Admitting: Adult Health

## 2021-01-05 DIAGNOSIS — J320 Chronic maxillary sinusitis: Secondary | ICD-10-CM | POA: Diagnosis not present

## 2021-01-24 DIAGNOSIS — M9904 Segmental and somatic dysfunction of sacral region: Secondary | ICD-10-CM | POA: Diagnosis not present

## 2021-01-24 DIAGNOSIS — M9905 Segmental and somatic dysfunction of pelvic region: Secondary | ICD-10-CM | POA: Diagnosis not present

## 2021-01-24 DIAGNOSIS — M9903 Segmental and somatic dysfunction of lumbar region: Secondary | ICD-10-CM | POA: Diagnosis not present

## 2021-01-24 DIAGNOSIS — M5136 Other intervertebral disc degeneration, lumbar region: Secondary | ICD-10-CM | POA: Diagnosis not present

## 2021-02-07 DIAGNOSIS — M9903 Segmental and somatic dysfunction of lumbar region: Secondary | ICD-10-CM | POA: Diagnosis not present

## 2021-02-07 DIAGNOSIS — M9904 Segmental and somatic dysfunction of sacral region: Secondary | ICD-10-CM | POA: Diagnosis not present

## 2021-02-07 DIAGNOSIS — M9905 Segmental and somatic dysfunction of pelvic region: Secondary | ICD-10-CM | POA: Diagnosis not present

## 2021-02-07 DIAGNOSIS — M5136 Other intervertebral disc degeneration, lumbar region: Secondary | ICD-10-CM | POA: Diagnosis not present

## 2021-03-15 DIAGNOSIS — Z9889 Other specified postprocedural states: Secondary | ICD-10-CM | POA: Diagnosis not present

## 2021-03-15 DIAGNOSIS — J320 Chronic maxillary sinusitis: Secondary | ICD-10-CM | POA: Diagnosis not present

## 2021-03-30 DIAGNOSIS — Z4889 Encounter for other specified surgical aftercare: Secondary | ICD-10-CM | POA: Diagnosis not present

## 2021-03-30 DIAGNOSIS — J320 Chronic maxillary sinusitis: Secondary | ICD-10-CM | POA: Diagnosis not present

## 2021-04-19 DIAGNOSIS — M5136 Other intervertebral disc degeneration, lumbar region: Secondary | ICD-10-CM | POA: Diagnosis not present

## 2021-04-19 DIAGNOSIS — M9904 Segmental and somatic dysfunction of sacral region: Secondary | ICD-10-CM | POA: Diagnosis not present

## 2021-04-19 DIAGNOSIS — M9905 Segmental and somatic dysfunction of pelvic region: Secondary | ICD-10-CM | POA: Diagnosis not present

## 2021-04-19 DIAGNOSIS — M9903 Segmental and somatic dysfunction of lumbar region: Secondary | ICD-10-CM | POA: Diagnosis not present

## 2021-05-10 ENCOUNTER — Other Ambulatory Visit: Payer: Self-pay

## 2021-05-10 ENCOUNTER — Ambulatory Visit (INDEPENDENT_AMBULATORY_CARE_PROVIDER_SITE_OTHER): Payer: Medicare HMO | Admitting: Internal Medicine

## 2021-05-10 ENCOUNTER — Encounter: Payer: Self-pay | Admitting: Internal Medicine

## 2021-05-10 VITALS — BP 144/88 | HR 83 | Temp 97.9°F | Resp 16 | Ht 71.0 in | Wt 267.0 lb

## 2021-05-10 DIAGNOSIS — M5432 Sciatica, left side: Secondary | ICD-10-CM | POA: Diagnosis not present

## 2021-05-10 MED ORDER — DEXAMETHASONE 4 MG PO TABS: ORAL_TABLET | ORAL | 0 refills | Status: DC

## 2021-05-10 MED ORDER — GABAPENTIN 300 MG PO CAPS: ORAL_CAPSULE | ORAL | 0 refills | Status: DC

## 2021-05-10 NOTE — Progress Notes (Signed)
? ? ?  Future Appointments  ?Date Time Provider Department  ?05/10/2021  3:30 PM Unk Pinto, MD [facility]  ?07/25/2021 11:00 AM Unk Pinto, MD [facility]  ?12/21/2021  2:00 PM Liane Comber, NP [facility]  ? ? ?History of Present Illness: ? ?    Patient is a very nice 71 yo MWM with HTN, HLD, GERD presenting with c/o LBP with Left sciatica.   ? ?Medications ? ?  bisoprolol-hctz 10-6.25 MG tablet, TAKE 1 TABLET DAILY  ?  losartan 50 MG tablet, TAKE 1 TABLET DAILY FOR BP ?  aspirin 81 MG tablet, Take 162 mg  daily.  ?  meloxicam 15 MG tablet,  Take as needed daily for pain ? ?  vitamin B-12  50 MCG tablet, Take 50 mcg Takes 1 tablet every other day. ? VITAMIN D 5000 UNITS , Take 5,000 Units  daily. ?  clindamycin 150 MG capsule, Take  4 (four) times daily. ?  omeprazole 40 MG capsule, TAKE 1 CAPSULE 2 TMIES A DAY FOR FOR INDIGESTION & HEARTBURN (Patient taking differently: TAKE 1 CAPSULE Monday, Wednesday AND Friday FOR [place]) ?  terbinafine 250 MG tablet, Take 1 tablet Daily for 1 month-  alternating 1 month on & 1 month off ? ?Problem list ?He has Essential hypertension; Hyperlipidemia, mixed; Vitamin D deficiency; Abnormal glucose; CKD (chronic kidney disease) stage 3, GFR 30-59 ml/min ([place]); Morbid obesity (BMI 35+ with htn, hyperlipidemia, prediabetes) ; Prostate cancer ([place]); Gastroesophageal reflux disease; FHx: heart disease; BPH with obstruction/lower urinary tract symptoms; Bursitis of left hip; and Left hip pain on their problem list. ?  ?Observations/Objective: ? ?BP (!) 144/88   Pulse 83   Temp 97.9 ?F (36.6 ?C)   Resp 16   Ht '5\' 11"'$  (1.803 m)   Wt 267 lb (121.1 kg)   SpO2 96%   BMI 37.24 kg/m?  ? ?HEENT - WNL. ?Neck - supple.  ?Chest - Clear equal BS. ?Cor - Nl HS. RRR w/o sig MGR. PP 1(+). No edema. ?MS- FROM w/o deformities.   Bilat hip internal / external rotation and SLR - WNL. (+) tender over Lt S jt area. Gait  sl limp favoring the LLE.  ?Neuro -  Nl w/o focal  abnormalities. ? ? ?Assessment and Plan: ? ? ?1. Sciatica of left side ? ?- dexamethasone  4 MG tablet;  ?Take 1 tab 3 x day - 3 days, then 2 x day - 3 days, then 1 tab daily   ?Dispense: 20 tablet ? ?- gabapentin 300 MG capsule;  ?Take  1 capsule  3 to 4 x /day  as needed for Pain   ?Dispense: 120 capsule;  ? ?- DG Lumbar Spine Complete; Future ? ? ?Follow Up Instructions: ? ?     I discussed the assessment and treatment plan with the patient. The patient was provided an opportunity to ask questions and all were answered. The patient agreed with the plan and demonstrated an understanding of the instructions. ?  ?    The patient was advised to call back or seek an in-person evaluation if the symptoms worsen or if the condition fails to improve as anticipated. ? ? ?Kirtland Bouchard, MD ? ?

## 2021-05-11 ENCOUNTER — Ambulatory Visit
Admission: RE | Admit: 2021-05-11 | Discharge: 2021-05-11 | Disposition: A | Discharge status: Home / Self Care | Payer: Medicare HMO | Source: Ambulatory Visit | Attending: Internal Medicine | Admitting: Internal Medicine

## 2021-05-11 DIAGNOSIS — M5432 Sciatica, left side: Secondary | ICD-10-CM

## 2021-05-11 DIAGNOSIS — M545 Low back pain, unspecified: Secondary | ICD-10-CM | POA: Diagnosis not present

## 2021-05-12 NOTE — Progress Notes (Signed)
<><><><><><><><><><><><><><><><><><><><><><><><><><><><><><><><><> ?<><><><><><><><><><><><><><><><><><><><><><><><><><><><><><><><><> ?-   Test results slightly outside the reference range are not unusual. ?If there is anything important, I will review this with you,  ?otherwise it is considered normal test values.  ?If you have further questions,  ?please do not hesitate to contact me at the office or via My Chart.  ?<><><><><><><><><><><><><><><><><><><><><><><><><><><><><><><><><> ?<><><><><><><><><><><><><><><><><><><><><><><><><><><><><><><><><> ? ?-  Lumbar spine X-rays show arthritic changes  - and No  fractures , tumors .  ? ?-if pain persists & not responding to treatments , then will need to plan for a MRI ? ?<><><><><><><><><><><><><><><><><><><><><><><><><><><><><><><><><> ?<><><><><><><><><><><><><><><><><><><><><><><><><><><><><><><><><> ? ? ? ?

## 2021-05-15 ENCOUNTER — Other Ambulatory Visit: Payer: Self-pay | Admitting: Internal Medicine

## 2021-05-22 ENCOUNTER — Other Ambulatory Visit: Payer: Self-pay

## 2021-05-22 ENCOUNTER — Ambulatory Visit (INDEPENDENT_AMBULATORY_CARE_PROVIDER_SITE_OTHER): Payer: Medicare HMO | Admitting: Internal Medicine

## 2021-05-22 ENCOUNTER — Encounter: Payer: Self-pay | Admitting: Internal Medicine

## 2021-05-22 VITALS — BP 128/74 | HR 68 | Temp 97.9°F | Ht 71.0 in | Wt 265.0 lb

## 2021-05-22 DIAGNOSIS — M7062 Trochanteric bursitis, left hip: Secondary | ICD-10-CM | POA: Diagnosis not present

## 2021-05-22 DIAGNOSIS — M5440 Lumbago with sciatica, unspecified side: Secondary | ICD-10-CM

## 2021-05-22 NOTE — Progress Notes (Signed)
? ? ?  Future Appointments  ?Date Time Provider Department  ?05/22/2021  4:00 PM Unk Pinto, MD [facility]  ?07/25/2021               CPE 11:00 AM Unk Pinto, MD [facility]  ?12/21/2021             Wellness  2:00 PM Liane Comber, NP [facility]  ? ? ?History of Present Illness: ? ?   Patient is a very nice 71 yo MWM with HTN, HLD, GERD presenting  for 12 dayPatient reports significant improvement in his Left sciatica -type pains radiating down his lateral LLE to the calf, but now reports moderate pain /discomfort in the region of his R t hip bursae.  f/u of LBP with Left sciatica for which he was treated with a Decadron pulse /taper & Gabapentin for pain.  ? ?Medications ? ?  bisoprolol-hctz 10-6.25 MG tablet, TAKE 1 TABLET  DAILY  ?  losartan  50 MG tablet, TAKE 1 TABLET DAILY FOR BP ?  aspirin 81 MG tablet, Take 2 tabs daily.  ?  vitamin B-12 50 MCG tablet, Take 50 mcg by mouth. Takes 1 tablet every other day. ?  VITAMIN D 5000 UNITS CAPS, Take 5,000 Units  daily. ?  gabapentin 300 MG capsule, Take  1 capsule  3 to 4 x /day  as needed for Pain ?  omeprazole  40 MG capsule, taking differently: TAKE 1 CAPSULE Monday, Wednesday & Friday  ?  terbinafine 250 MG tablet, Take 1 tablet Daily for 1 month-  alternating 1 month on & 1 month off ( off) ? ?Problem list ?He has Essential hypertension; Hyperlipidemia, mixed; Vitamin D deficiency; Abnormal glucose; CKD (chronic kidney disease) stage 3, GFR 30-59 ml/min ([place]); Morbid obesity (BMI 35+ with htn, hyperlipidemia, prediabetes) ; Prostate cancer ([place]); Gastroesophageal reflux disease; FHx: heart disease; BPH with obstruction/lower urinary tract symptoms; Bursitis of left hip; and Left hip pain on their problem list. ?  ?Observations/Objective: ? ?BP 128/74   Pulse 68   Temp 97.9 ?F (36.6 ?C)   Ht '5\' 11"'$  (1.803 m)   Wt 265 lb (120.2 kg)   SpO2 99%   BMI 36.96 kg/m?  ? ?HEENT - WNL. ?Neck - supple.  ?Chest - Clear equal BS. ?Cor - Nl HS. RRR w/o sig MGR.  PP 1(+). No edema. ?MS- FROM w/o deformities.  Gait Nl. (+) Tender over Left hip greater trochanteric bursa.  ?Neuro -  Nl w/o focal abnormalities. ? ?Procedure :  after informed consent and aseptic prep with alcohol, patient was injected 20 mg Dexamethasone  (2 ml of 10 mg /ml) with 1 ml of Marcaine 0.5% into the bursae.  ? ?Assessment and Plan: ? ? ?1. Acute left-sided low back pain with sciatica ? ?2. Trochanteric bursitis of left hip ? ? ?Follow Up Instructions: ? ?  ?    I discussed the assessment and treatment plan with the patient. The patient was provided an opportunity to ask questions and all were answered. The patient agreed with the plan and demonstrated an understanding of the instructions. ?  ?    The patient was advised to call back or seek an in-person evaluation if the symptoms worsen or if the condition fails to improve as anticipated. ? ? ? ?Kirtland Bouchard, MD ? ?

## 2021-05-22 NOTE — Patient Instructions (Signed)
Hip Bursitis ?Hip bursitis is the inflammation of one or more bursae in the hip joint. Bursae are small fluid-filled sacs that absorb shock and prevent bones from rubbing against each other. ?Hip bursitis can cause mild to moderate pain, and symptoms often come and go over time. ?What are the causes? ?This condition results from increased friction between the hip bones and the tendons around the hip joint. This condition can happen if you: ?Overuse your hip muscles. ?Injure your hip. ?Have weak buttocks muscles. ?Have bone spurs. ?Have an infection. ?In some cases, the cause may not be known. ?What increases the risk? ?You are more likely to develop this condition if: ?You injured your hip previously or had hip surgery. ?You have a medical condition, such as arthritis, gout, diabetes, or thyroid disease. ?You have spine problems. ?You have one leg that is shorter than the other. ?You participate in athletic activities that include repetitive motion, like running. ?You participate in sports where there is a risk of injury or falling, such as football, martial arts, or skiing. ?What are the signs or symptoms? ?Symptoms may come and go, and they often include: ?Pain in the hip or groin area. Pain may get worse with movement. ?Tenderness and swelling of the hip. ?In rare cases, the bursa may become infected. If this happens, you may get a fever, as well as warmth and redness in the hip area. ?How is this diagnosed? ?This condition may be diagnosed based on: ?Your symptoms. ?Your medical history. ?A physical exam. ?Imaging tests, such as: ?X-rays to check your bones. ?MRI or ultrasound to check your tendons and muscles. ?Bone scan. ?A biopsy to remove fluid from your inflamed bursa for testing. ?How is this treated? ?This condition is treated by resting, icing, applying pressure (compression), and raising (elevating) the injured area. This is called RICE treatment. ?In some cases, RICE treatment may not be enough to make  your symptoms go away. Treatment may also include: ?Taking medicine to help with swelling and pain. ?Using crutches, a cane, or a walker to decrease the strain on your hip. ?Getting a shot of cortisone medicine to help reduce swelling. ?Taking other medicines if the bursa is infected. ?Draining fluid out of the bursa to help relieve swelling. ?Having surgery to remove a damaged or infected bursa. This is rare. ?Long-term treatment may include: ?Physical therapy exercises for strength and flexibility. ?Lifestyle changes, such as weight loss, to reduce the strain on the hip. ?Follow these instructions at home: ?Managing pain, stiffness, and swelling ?  ?If directed, put ice on the painful area. ?Put ice in a plastic bag. ?Place a towel between your skin and the bag. ?Leave the ice on for 20 minutes, 2-3 times a day. ?Raise (elevate) your hip as much as you can without pain. To do this, put a pillow under your hips while you lie down. ?If directed, apply heat to the affected area as often as told by your health care provider. Use the heat source that your health care provider recommends, such as a moist heat pack or a heating pad. ?Place a towel between your skin and the heat source. ?Leave the heat on for 20-30 minutes. ?Remove the heat if your skin turns bright red. This is especially important if you are unable to feel pain, heat, or cold. You may have a greater risk of getting burned. ?Activity ?Do not use your hip to support your body weight until your health care provider says that you can. Use crutches,  a cane, or a walker as told by your health care provider. ?If the affected leg is one that you use to drive, ask your health care provider if it is safe to drive. ?Rest and protect your hip as much as possible until your pain and swelling get better. ?Return to your normal activities as told by your health care provider. Ask your health care provider what activities are safe for you. ?Do exercises as told by your  health care provider. ?General instructions ?Take over-the-counter and prescription medicines only as told by your health care provider. ?Gently massage and stretch your injured area as often as is comfortable. ?Wear compression wraps only as told by your health care provider. ?If one of your legs is shorter than the other, get fitted for a shoe insert or orthotic. ?Maintain a healthy weight. Follow instructions from your health care provider for weight control. These may include dietary restrictions. ?Keep all follow-up visits as told by your health care provider. This is important. ?How is this prevented? ?Exercise regularly, as told by your health care provider. ?Wear supportive footwear that is appropriate for your sport. ?Warm up and stretch before being active. Cool down and stretch after being active. ?Take breaks regularly from repetitive activity. ?If an activity irritates your hip or causes pain, avoid the activity as much as possible. ?Avoid sitting down for long periods at a time. ?Where to find more information ?American [employer]: orthoinfo.aaos.org ?Contact a health care provider if: ?You have a fever. ?You develop new symptoms. ?You have trouble walking or doing everyday activities. ?You have pain that gets worse or does not get better with medicine. ?You develop red skin or a feeling of warmth in your hip area. ?Get help right away if: ?You cannot move your hip. ?You have severe pain. ?You cannot control the muscles in your feet. ?Summary ?Hip bursitis is the inflammation of one or more bursae in the hip joint. Bursae are small fluid-filled sacs that absorb shock and prevent bones from rubbing against each other. ?Hip bursitis can cause hip or groin pain, and symptoms often come and go over time. ?This condition is often treated by resting, icing, applying pressure (compression), and raising (elevating) the injured area. Other treatments may be needed. ?This information is not  intended to replace advice given to you by your health care provider. Make sure you discuss any questions you have with your health care provider. ?Document Revised: 12/22/2018 Document Reviewed: 10/28/2017 ?Elsevier Patient Education ? [place]. ? ?

## 2021-06-14 ENCOUNTER — Encounter: Payer: Medicare HMO | Admitting: Internal Medicine

## 2021-06-16 DIAGNOSIS — M47816 Spondylosis without myelopathy or radiculopathy, lumbar region: Secondary | ICD-10-CM | POA: Diagnosis not present

## 2021-06-16 DIAGNOSIS — M7918 Myalgia, other site: Secondary | ICD-10-CM | POA: Diagnosis not present

## 2021-06-16 DIAGNOSIS — R29818 Other symptoms and signs involving the nervous system: Secondary | ICD-10-CM | POA: Diagnosis not present

## 2021-06-16 DIAGNOSIS — M545 Low back pain, unspecified: Secondary | ICD-10-CM | POA: Diagnosis not present

## 2021-06-16 DIAGNOSIS — M25562 Pain in left knee: Secondary | ICD-10-CM | POA: Diagnosis not present

## 2021-06-16 DIAGNOSIS — M25561 Pain in right knee: Secondary | ICD-10-CM | POA: Diagnosis not present

## 2021-06-21 ENCOUNTER — Other Ambulatory Visit: Payer: Self-pay | Admitting: Nurse Practitioner

## 2021-07-04 DIAGNOSIS — M7918 Myalgia, other site: Secondary | ICD-10-CM | POA: Diagnosis not present

## 2021-07-04 DIAGNOSIS — R29818 Other symptoms and signs involving the nervous system: Secondary | ICD-10-CM | POA: Diagnosis not present

## 2021-07-04 DIAGNOSIS — M47816 Spondylosis without myelopathy or radiculopathy, lumbar region: Secondary | ICD-10-CM | POA: Diagnosis not present

## 2021-07-04 DIAGNOSIS — G894 Chronic pain syndrome: Secondary | ICD-10-CM | POA: Diagnosis not present

## 2021-07-04 DIAGNOSIS — Z789 Other specified health status: Secondary | ICD-10-CM | POA: Diagnosis not present

## 2021-07-04 DIAGNOSIS — Z7409 Other reduced mobility: Secondary | ICD-10-CM | POA: Diagnosis not present

## 2021-07-04 DIAGNOSIS — G8929 Other chronic pain: Secondary | ICD-10-CM | POA: Diagnosis not present

## 2021-07-04 DIAGNOSIS — R531 Weakness: Secondary | ICD-10-CM | POA: Diagnosis not present

## 2021-07-19 DIAGNOSIS — M7918 Myalgia, other site: Secondary | ICD-10-CM | POA: Diagnosis not present

## 2021-07-19 DIAGNOSIS — M533 Sacrococcygeal disorders, not elsewhere classified: Secondary | ICD-10-CM | POA: Diagnosis not present

## 2021-07-19 DIAGNOSIS — G8929 Other chronic pain: Secondary | ICD-10-CM | POA: Diagnosis not present

## 2021-07-21 DIAGNOSIS — G8929 Other chronic pain: Secondary | ICD-10-CM | POA: Diagnosis not present

## 2021-07-21 DIAGNOSIS — M47816 Spondylosis without myelopathy or radiculopathy, lumbar region: Secondary | ICD-10-CM | POA: Diagnosis not present

## 2021-07-21 DIAGNOSIS — G894 Chronic pain syndrome: Secondary | ICD-10-CM | POA: Diagnosis not present

## 2021-07-21 DIAGNOSIS — Z789 Other specified health status: Secondary | ICD-10-CM | POA: Diagnosis not present

## 2021-07-21 DIAGNOSIS — R29818 Other symptoms and signs involving the nervous system: Secondary | ICD-10-CM | POA: Diagnosis not present

## 2021-07-21 DIAGNOSIS — Z7409 Other reduced mobility: Secondary | ICD-10-CM | POA: Diagnosis not present

## 2021-07-21 DIAGNOSIS — M7918 Myalgia, other site: Secondary | ICD-10-CM | POA: Diagnosis not present

## 2021-07-21 DIAGNOSIS — R531 Weakness: Secondary | ICD-10-CM | POA: Diagnosis not present

## 2021-07-24 ENCOUNTER — Encounter: Payer: Self-pay | Admitting: Internal Medicine

## 2021-07-24 NOTE — Patient Instructions (Signed)

## 2021-07-24 NOTE — Progress Notes (Unsigned)
Annual  Screening/Preventative Visit &  Comprehensive Evaluation & Examination  Future Appointments  Date Time Provider Department  07/25/2021 11:00 AM Unk Pinto, MD [facility]  12/21/2021               Wellness  2:00 PM Magda Bernheim, NP [facility]  07/31/2022 11:00 AM Unk Pinto, MD [facility]            This very nice 71 y.o. MWM  presents for a Screening /Preventative Visit & comprehensive evaluation and management of multiple medical co-morbidities.  Patient has been followed for HTN, HLD, Prediabetes and Vitamin D Deficiency.  In 2018, patient was dx'd with patient Prostate Cancer treated by Radiation and is followed by Dr. Louis Meckel by q24month for  PSA' s which have been low/stable.         HTN predates circa 2009. Patient's BP has been controlled at home.   Patient has CKD3a (GFR 59) attributed to his HTN. Today's  . Patient denies any cardiac symptoms as chest pain, palpitations, shortness of breath, dizziness or ankle swelling.       Patient's hyperlipidemia is controlled with diet. Last lipids were at goal:  Lab Results  Component Value Date   CHOL 148 12/21/2020   HDL 45 12/21/2020   LDLCALC 87 12/21/2020   TRIG 71 12/21/2020   CHOLHDL 3.3 12/21/2020  \      Patient has hx/o  Moderately severe Obesity (BMI 37.38) with consequent   prediabetes (A1c 5.9% /2009and patient denies reactive hypoglycemic symptoms, visual blurring, diabetic polys or paresthesias. Last A1c was near goal :   Lab Results  Component Value Date   HGBA1C 5.8 (H) 12/21/2020         Finally, patient has history of Vitamin D Deficiency ("32" /2009) and last vitamin D was at goal :   Lab Results  Component Value Date   VD25OH 82 06/13/2020     Current Outpatient Medications on File Prior to Visit  Medication Sig   aspirin 81 MG tablet Take 162 mg  daily.    bisoprolol-hctz 10-6.25 MG t TAKE 1 TABLET DAILY    VITAMIN D 5000 UNITS  Take daily.   losartan 50 MG tablet TAKE 1 TABLET  DAILY FOR BP   omeprazole  40 MG capsule TAKE 1 CAPSULE Mon Wed Fri    vitamin B-12  50 MCG tablet Takes 1 tablet every other day.    Allergies  Allergen Reactions   Ace Inhibitors Other (See Comments)    Cough   Citalopram Nausea And Vomiting     Past Medical History:  Diagnosis Date   GERD (gastroesophageal reflux disease)    Hemorrhoids    History of Helicobacter pylori infection    HTN (hypertension)    Hyperlipemia    Obesity    [place]   DVT  left lower leg   [place]    Vitamin D deficiency      Health Maintenance  Topic Date Due   Zoster Vaccines- Shingrix (1 of 2) Never done   Fecal DNA (Cologuard)  12/22/2016   COVID-19 Vaccine (3 - Moderna risk series) 05/27/2019   Pneumonia Vaccine 72 Years old (2 - PCV) 09/09/2020   INFLUENZA VACCINE  10/03/2021   Hepatitis C Screening  Completed   HPV VACCINES  Aged Out   TETANUS/TDAP  Discontinued     Immunization History  Administered Date(s) Administered   Influenza Split 12/23/2013   Influenza, High Dose  03/20/2017, 02/04/2019, 12/14/2019, 12/21/2020   Influenza, Seasonal 01/17/2016   Moderna Sars-Covid-2 Vacc 04/01/2019, 04/29/2019   Pneumococcal -23 12/01/2008, 09/10/2019   Tdap 12/01/2008    - Cologard 12/22/2013 - Negative   - Cologard order 03/21/2020 and 04/10 /2022 - NOT returned    Past Surgical History:  Procedure Laterality Date   CYSTOSCOPY/RETROGRADE/URETEROSCOPY Left 01/02/2013   Procedure: CYSTOSCOPY/LEFT URETERAL WASHINGS/LEFT RETROGRADE PYELOGRAM/ LEFT URETEROSCOPY/URETERAL BIOPSY. BLADDER BIOPSY;  Surgeon: Ardis Hughs, MD;  Location: [facility];  Service: Urology;  Laterality: Left;  With STENT   HEMORRHOID SURGERY     age 32   TONSILLECTOMY       Family History  Problem Relation Age of Onset   Diabetes Father    Heart disease Father    Cancer Mother        Bladder     Social History   Tobacco Use   Smoking status: Never    Smokeless tobacco: Never  Substance Use Topics   Alcohol use: No   Drug use: No     ROS Constitutional: Denies fever, chills, weight loss/gain, headaches, insomnia,  night sweats or change in appetite. Does c/o fatigue. Eyes: Denies redness, blurred vision, diplopia, discharge, itchy or watery eyes.  ENT: Denies discharge, congestion, post nasal drip, epistaxis, sore throat, earache, hearing loss, dental pain, Tinnitus, Vertigo, Sinus pain or snoring.  Cardio: Denies chest pain, palpitations, irregular heartbeat, syncope, dyspnea, diaphoresis, orthopnea, PND, claudication or edema Respiratory: denies cough, dyspnea, DOE, pleurisy, hoarseness, laryngitis or wheezing.  Gastrointestinal: Denies dysphagia, heartburn, reflux, water brash, pain, cramps, nausea, vomiting, bloating, diarrhea, constipation, hematemesis, melena, hematochezia, jaundice or hemorrhoids Genitourinary: Denies dysuria, frequency, urgency, nocturia, hesitancy, discharge, hematuria or flank pain Musculoskeletal: Denies arthralgia, myalgia, stiffness, Jt. Swelling, pain, limp or strain/sprain. Denies Falls. Skin: Denies puritis, rash, hives, warts, acne, eczema or change in skin lesion Neuro: No weakness, tremor, incoordination, spasms, paresthesia or pain Psychiatric: Denies confusion, memory loss or sensory loss. Denies Depression. Endocrine: Denies change in weight, skin, hair change, nocturia, and paresthesia, diabetic polys, visual blurring or hyper / hypo glycemic episodes.  Heme/Lymph: No excessive bleeding, bruising or enlarged lymph nodes.   Physical Exam  There were no vitals taken for this visit.  General Appearance: Well nourished and well groomed and in no apparent distress.  Eyes: PERRLA, EOMs, conjunctiva no swelling or erythema, normal fundi and vessels. Sinuses: No frontal/maxillary tenderness ENT/Mouth: EACs patent / TMs  nl. Nares clear without erythema, swelling, mucoid exudates. Oral hygiene is good.  No erythema, swelling, or exudate. Tongue normal, non-obstructing. Tonsils not swollen or erythematous. Hearing normal.  Neck: Supple, thyroid not palpable. No bruits, nodes or JVD. Respiratory: Respiratory effort normal.  BS equal and clear bilateral without rales, rhonci, wheezing or stridor. Cardio: Heart sounds are normal with regular rate and rhythm and no murmurs, rubs or gallops. Peripheral pulses are normal and equal bilaterally without edema. No aortic or femoral bruits. Chest: symmetric with normal excursions and percussion.  Abdomen: Soft, with Nl bowel sounds. Nontender, no guarding, rebound, hernias, masses, or organomegaly.  Lymphatics: Non tender without lymphadenopathy.  Musculoskeletal: Full ROM all peripheral extremities, joint stability, 5/5 strength, and normal gait. Skin: Warm and dry without rashes, lesions, cyanosis, clubbing or  ecchymosis.  Neuro: Cranial nerves intact, reflexes equal bilaterally. Normal muscle tone, no cerebellar symptoms. Sensation intact.  Pysch: Alert and oriented X 3 with normal affect, insight and judgment appropriate.   Assessment and Plan  1. Annual Preventative/Screening Exam  Patient was counseled in prudent diet, weight control to achieve/maintain BMI less than 25, BP monitoring, regular exercise and medications as discussed.  Discussed med effects and SE's. Routine screening labs and tests as requested with regular follow-up as recommended. Over 40 minutes of exam, counseling, chart review and high complex critical decision making was performed   Kirtland Bouchard, MD

## 2021-07-25 ENCOUNTER — Ambulatory Visit (INDEPENDENT_AMBULATORY_CARE_PROVIDER_SITE_OTHER): Payer: Medicare HMO | Admitting: Internal Medicine

## 2021-07-25 ENCOUNTER — Encounter: Payer: Self-pay | Admitting: Internal Medicine

## 2021-07-25 VITALS — BP 130/76 | HR 86 | Temp 97.9°F | Resp 16 | Ht 71.0 in | Wt 263.8 lb

## 2021-07-25 DIAGNOSIS — Z0000 Encounter for general adult medical examination without abnormal findings: Secondary | ICD-10-CM

## 2021-07-25 DIAGNOSIS — Z79899 Other long term (current) drug therapy: Secondary | ICD-10-CM

## 2021-07-25 DIAGNOSIS — E559 Vitamin D deficiency, unspecified: Secondary | ICD-10-CM

## 2021-07-25 DIAGNOSIS — R7309 Other abnormal glucose: Secondary | ICD-10-CM | POA: Diagnosis not present

## 2021-07-25 DIAGNOSIS — N1831 Chronic kidney disease, stage 3a: Secondary | ICD-10-CM

## 2021-07-25 DIAGNOSIS — Z8249 Family history of ischemic heart disease and other diseases of the circulatory system: Secondary | ICD-10-CM | POA: Diagnosis not present

## 2021-07-25 DIAGNOSIS — Z136 Encounter for screening for cardiovascular disorders: Secondary | ICD-10-CM

## 2021-07-25 DIAGNOSIS — I700 Atherosclerosis of aorta: Secondary | ICD-10-CM

## 2021-07-25 DIAGNOSIS — Z0001 Encounter for general adult medical examination with abnormal findings: Secondary | ICD-10-CM

## 2021-07-25 DIAGNOSIS — E782 Mixed hyperlipidemia: Secondary | ICD-10-CM

## 2021-07-25 DIAGNOSIS — C61 Malignant neoplasm of prostate: Secondary | ICD-10-CM

## 2021-07-25 DIAGNOSIS — I10 Essential (primary) hypertension: Secondary | ICD-10-CM

## 2021-07-26 LAB — CBC WITH DIFFERENTIAL/PLATELET
Absolute Monocytes: 725 cells/uL (ref 200–950)
Basophils Absolute: 50 cells/uL (ref 0–200)
Basophils Relative: 0.8 %
Eosinophils Absolute: 223 cells/uL (ref 15–500)
Eosinophils Relative: 3.6 %
HCT: 47 % (ref 38.5–50.0)
Hemoglobin: 16.2 g/dL (ref 13.2–17.1)
Lymphs Abs: 1786 cells/uL (ref 850–3900)
MCH: 32 pg (ref 27.0–33.0)
MCHC: 34.5 g/dL (ref 32.0–36.0)
MCV: 92.7 fL (ref 80.0–100.0)
MPV: 10.1 fL (ref 7.5–12.5)
Monocytes Relative: 11.7 %
Neutro Abs: 3416 cells/uL (ref 1500–7800)
Neutrophils Relative %: 55.1 %
Platelets: 236 10*3/uL (ref 140–400)
RBC: 5.07 10*6/uL (ref 4.20–5.80)
RDW: 13.1 % (ref 11.0–15.0)
Total Lymphocyte: 28.8 %
WBC: 6.2 10*3/uL (ref 3.8–10.8)

## 2021-07-26 LAB — LIPID PANEL
Cholesterol: 178 mg/dL (ref <200)
HDL: 55 mg/dL (ref >=40)
LDL Cholesterol (Calc): 102 mg/dL (calc) — ABNORMAL HIGH
Non-HDL Cholesterol (Calc): 123 mg/dL (calc) (ref <130)
Total CHOL/HDL Ratio: 3.2 (calc) (ref <5.0)
Triglycerides: 110 mg/dL (ref <150)

## 2021-07-26 LAB — URINALYSIS, ROUTINE W REFLEX MICROSCOPIC
Bacteria, UA: NONE SEEN /HPF
Bilirubin Urine: NEGATIVE
Glucose, UA: NEGATIVE
Hgb urine dipstick: NEGATIVE
Hyaline Cast: NONE SEEN /LPF
Ketones, ur: NEGATIVE
Leukocytes,Ua: NEGATIVE
Nitrite: NEGATIVE
RBC / HPF: NONE SEEN /HPF (ref 0–2)
Specific Gravity, Urine: 1.032 (ref 1.001–1.035)
WBC, UA: NONE SEEN /HPF (ref 0–5)
pH: 5.5 (ref 5.0–8.0)

## 2021-07-26 LAB — COMPLETE METABOLIC PANEL WITH GFR
AG Ratio: 1.6 (calc) (ref 1.0–2.5)
ALT: 15 U/L (ref 9–46)
AST: 20 U/L (ref 10–35)
Albumin: 4.5 g/dL (ref 3.6–5.1)
Alkaline phosphatase (APISO): 74 U/L (ref 35–144)
BUN: 19 mg/dL (ref 7–25)
CO2: 30 mmol/L (ref 20–32)
Calcium: 10.2 mg/dL (ref 8.6–10.3)
Chloride: 100 mmol/L (ref 98–110)
Creat: 1.07 mg/dL (ref 0.70–1.28)
Globulin: 2.8 g/dL (calc) (ref 1.9–3.7)
Glucose, Bld: 100 mg/dL — ABNORMAL HIGH (ref 65–99)
Potassium: 4.3 mmol/L (ref 3.5–5.3)
Sodium: 140 mmol/L (ref 135–146)
Total Bilirubin: 0.6 mg/dL (ref 0.2–1.2)
Total Protein: 7.3 g/dL (ref 6.1–8.1)
eGFR: 74 mL/min/{1.73_m2} (ref >=60)

## 2021-07-26 LAB — TSH: TSH: 1.3 mIU/L (ref 0.40–4.50)

## 2021-07-26 LAB — MICROALBUMIN / CREATININE URINE RATIO
Creatinine, Urine: 198 mg/dL (ref 20–320)
Microalb Creat Ratio: 6 mcg/mg creat (ref <30)
Microalb, Ur: 1.2 mg/dL

## 2021-07-26 LAB — HEMOGLOBIN A1C
Hgb A1c MFr Bld: 5.9 % of total Hgb — ABNORMAL HIGH (ref <5.7)
Mean Plasma Glucose: 123 mg/dL
eAG (mmol/L): 6.8 mmol/L

## 2021-07-26 LAB — PTH, INTACT AND CALCIUM
Calcium: 10.2 mg/dL (ref 8.6–10.3)
PTH: 53 pg/mL (ref 16–77)

## 2021-07-26 LAB — MAGNESIUM: Magnesium: 2.1 mg/dL (ref 1.5–2.5)

## 2021-07-26 LAB — VITAMIN D 25 HYDROXY (VIT D DEFICIENCY, FRACTURES): Vit D, 25-Hydroxy: 83 ng/mL (ref 30–100)

## 2021-07-26 LAB — INSULIN, RANDOM: Insulin: 13.4 u[IU]/mL

## 2021-07-26 LAB — MICROSCOPIC MESSAGE

## 2021-07-26 NOTE — Progress Notes (Signed)
<><><><><><><><><><><><><><><><><><><><><><><><><><><><><><><><><> <><><><><><><><><><><><><><><><><><><><><><><><><><><><><><><><><>  -   Total Chol = 178 -  Excellent   - Very low risk for Heart Attack  / Stroke,  - But Bad LDL Chol = 102 is Borderline          (  Ideal or Goal is less than 70  !  )   - So - Cholesterol is too high - Recommend low cholesterol diet   - Cholesterol only comes from animal sources  - ie. meat, dairy, egg yolks  - Eat all the vegetables you want.  - Avoid Meat, Avoid Meat,  Avoid Meat - especially Red Meat - Beef AND Pork .  - Avoid cheese & dairy - milk & ice cream.     - Cheese is the most concentrated form of trans-fats which  is the worst thing to clog up our arteries.   - Veggie cheese is OK which can be found in the fresh  produce section at [employer] or [employer] or [employer] <><><><><><><><><><><><><><><><><><><><><><><><><><><><><><><><><>  - A1c = 5.9%  - Blood sugar and A1c are  STILL elevated in the borderline and  early or pre-diabetes range which has the same   300% increased risk for heart attack, stroke, cancer and                                     alzheimer- type vascular dementia as full blown diabetes.   But the good news is that diet, exercise with  weight loss can cure the early diabetes at this point.  <><><><><><><><><><><><><><><><><><><><><><><><><><><><><><><><><> <><><><><><><><><><><><><><><><><><><><><><><><><><><><><><><><><>  -  PTH is a hormone that regulates calcium balance  & is Normal  <><><><><><><><><><><><><><><><><><><><><><><><><><><><><><><><><>  -  Vitamin D = 83 & is Excellent   - Please continue dosing Same  <><><><><><><><><><><><><><><><><><><><><><><><><><><><><><><><><>  -  All Else - CBC - Kidneys - Electrolytes - Liver - Magnesium & Thyroid    - all  Normal / OK <><><><><><><><><><><><><><><><><><><><><><><><><><><><><><><><><>  - Keep up the Great Work   !  <><><><><><><><><><><><><><><><><><><><><><><><><><><><><><><><><> <><><><><><><><><><><><><><><><><><><><><><><><><><><><><><><><><>

## 2021-07-27 DIAGNOSIS — M533 Sacrococcygeal disorders, not elsewhere classified: Secondary | ICD-10-CM | POA: Diagnosis not present

## 2021-08-01 ENCOUNTER — Ambulatory Visit: Payer: Medicare HMO

## 2021-08-24 DIAGNOSIS — M533 Sacrococcygeal disorders, not elsewhere classified: Secondary | ICD-10-CM | POA: Diagnosis not present

## 2021-08-24 DIAGNOSIS — M7061 Trochanteric bursitis, right hip: Secondary | ICD-10-CM | POA: Diagnosis not present

## 2021-08-24 DIAGNOSIS — M7062 Trochanteric bursitis, left hip: Secondary | ICD-10-CM | POA: Diagnosis not present

## 2021-10-19 DIAGNOSIS — M47816 Spondylosis without myelopathy or radiculopathy, lumbar region: Secondary | ICD-10-CM | POA: Diagnosis not present

## 2021-10-19 DIAGNOSIS — M533 Sacrococcygeal disorders, not elsewhere classified: Secondary | ICD-10-CM | POA: Diagnosis not present

## 2021-10-19 DIAGNOSIS — M791 Myalgia, unspecified site: Secondary | ICD-10-CM | POA: Diagnosis not present

## 2021-10-19 DIAGNOSIS — G894 Chronic pain syndrome: Secondary | ICD-10-CM | POA: Diagnosis not present

## 2021-11-09 ENCOUNTER — Other Ambulatory Visit: Payer: Self-pay | Admitting: Internal Medicine

## 2021-11-30 DIAGNOSIS — M7062 Trochanteric bursitis, left hip: Secondary | ICD-10-CM | POA: Diagnosis not present

## 2021-11-30 DIAGNOSIS — M7918 Myalgia, other site: Secondary | ICD-10-CM | POA: Diagnosis not present

## 2021-11-30 DIAGNOSIS — M533 Sacrococcygeal disorders, not elsewhere classified: Secondary | ICD-10-CM | POA: Diagnosis not present

## 2021-11-30 DIAGNOSIS — G8929 Other chronic pain: Secondary | ICD-10-CM | POA: Diagnosis not present

## 2021-11-30 DIAGNOSIS — M25552 Pain in left hip: Secondary | ICD-10-CM | POA: Diagnosis not present

## 2021-12-20 NOTE — Progress Notes (Unsigned)
MEDICARE ANNUAL WELLNESS VISIT AND FOLLOW UP Assessment:   Jason Patton was seen today for medicare wellness and follow-up.  Diagnoses and all orders for this visit:  Annual Medicare Wellness Visit Due annually  Health maintenance reviewed  Essential hypertension Continue medication; typically well controlled Monitor blood pressure at home; call if consistently over 130/80 Continue DASH diet.   Reminder to go to the ER if any CP, SOB, nausea, dizziness, severe HA, changes vision/speech, left arm numbness and tingling and jaw pain. -     CBC with Differential/Platelet -     COMPLETE METABOLIC PANEL WITH GFR -     Magnesium  Gastroesophageal reflux disease, unspecified whether esophagitis present Well managed by PPI and lifestyle Discussed diet, avoiding triggers and other lifestyle changes  Prostate cancer [facility]) S/p radiation, urology following  Overdue PSA/follow up - ordered today   CKD Stage III Increase fluids, avoid NSAIDS, monitor sugars, will monitor -     COMPLETE METABOLIC PANEL WITH GFR  BPH with obstruction/lower urinary tract symptoms Monitor; urology following  Vitamin D deficiency At goal at recent check; continue to recommend supplementation for goal of 60-100 Defer vitamin D level  Other abnormal glucose (prediabetes) Discussed disease and risks Discussed diet/exercise, weight management  A1C q80m   Morbid obesity (BMI 35+ with htn, hyperlipidemia, prediabetes)  Long discussion about weight loss, diet, and exercise Recommended diet heavy in fruits and veggies and low in animal meats, cheeses, and dairy products, appropriate calorie intake Patient will work on reducing portions, making small sustainable changes Discussed appropriate weight for height and initial goal (<250 lb)  Follow up at next visit  Hyperlipidemia, mixed Recently well controlled off of medications Continue low cholesterol diet and exercise, weight loss encouraged Check lipid panel.  -      Lipid panel -     TSH     Over 30 minutes of exam, counseling, chart review, and critical decision making was performed  Future Appointments  Date Time Provider DMcLennan 12/21/2021  2:00 PM WAlycia Rossetti NP [facility] None  04/03/2022  2:30 PM MUnk Pinto MD [facility] None  07/31/2022 11:00 AM MUnk Pinto MD [facility] None     Plan:   During the course of the visit the patient was educated and counseled about appropriate screening and preventive services including:   Pneumococcal vaccine  Influenza vaccine Prevnar 13 Td vaccine Screening electrocardiogram Colorectal cancer screening Diabetes screening Glaucoma screening Nutrition counseling    Subjective:  Jason Patton is a 71y.o. male who presents for Medicare Annual Wellness Visit and 3 month follow up for HTN, hyperlipidemia, prediabetes, obesity and vitamin D Def.   He completed 2nd course of terbinafine for bilateral foot onychomycosis and reports much improved and seems to be growing out.   He had R top #1 molar removed with complication, has been on abx for 4 months via dentist Dr. RWonda Horner most recently [facility]    He reports several months of intermittent lateral R hip pain, with rolling over, with flexion, some discomfort with walking. Has been working with chiropractor without resolution. Has taken aleve 3 tabs in AM last few days with some benefit.   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  Admits wife  is a very good cook, this limits him for weight Wt Readings from Last 3 Encounters:  07/25/21 263 lb 12.8 oz (119.7 kg)  05/22/21 265 lb (120.2 kg)  05/10/21 267 lb (121.1 kg)   His blood pressure has been controlled at home, today their BP is   He  does not workout. He denies chest pain, shortness of breath, dizziness.   He is not on cholesterol medication and denies myalgias. His cholesterol is at goal. The cholesterol last visit was:   Lab Results  Component Value Date   CHOL 178 07/25/2021   HDL 55 07/25/2021   LDLCALC 102 (H) 07/25/2021   TRIG 110 07/25/2021   CHOLHDL 3.2 07/25/2021   He has not been working on diet and exercise for prediabetes, and denies increased appetite, nausea, paresthesia of the feet, polydipsia, polyuria and visual disturbances. Last A1C in the office was:  Lab Results  Component Value Date   HGBA1C 5.9 (H) 07/25/2021   He has stable CKD III range. Last GFR Lab Results  Component Value Date   GFRNONAA 50 (L) 06/13/2020   GFRNONAA 59 (L) 03/15/2020   GFRNONAA 59 (L) 12/14/2019    Patient is on Vitamin D supplement.   Lab Results  Component Value Date   VD25OH 69 07/25/2021     Patient was diagnosed with Prostate Cancer in 2018 and was treated by 40 Radiation sessions July 5 thru Aug 29 and was followed by Dr. Louis Meckel however hasn't followed up since 05/2019 per notes in media. Patient agrees hasn't had PSA check since that time.   Lab Results  Component Value Date   PSA 0.37 12/21/2020   PSA 0.2 06/04/2019   PSA 0.4 10/30/2018     Medication Review:  Current Outpatient Medications (Endocrine & Metabolic):    dexamethasone (DECADRON) 4 MG tablet, Take 1 tab 3 x day - 3 days, then 2 x day - 3 days, then 1 tab daily (Patient not taking: Reported on 07/25/2021)  Current Outpatient Medications (Cardiovascular):    bisoprolol-hydrochlorothiazide (ZIAC) 10-6.25 MG tablet, TAKE 1 TABLET BY MOUTH DAILY FOR BLOOD PRESSURE   losartan (COZAAR) 50 MG tablet, TAKE 1 TABLET DAILY FOR BP   Current Outpatient Medications (Analgesics):    aspirin 81 MG tablet, Take 162 mg by mouth daily.   Current Outpatient Medications (Hematological):    vitamin B-12 (CYANOCOBALAMIN) 50 MCG tablet, Take 50 mcg by  mouth. Takes 1 tablet every other day.  Current Outpatient Medications (Other):    Cholecalciferol (VITAMIN D3) 5000 UNITS CAPS, Take 5,000 Units by mouth daily.   gabapentin (NEURONTIN) 300 MG capsule, Take  1 capsule  3 to 4 x /day  as needed for Pain (Patient not taking: Reported on 05/22/2021)   omeprazole (PRILOSEC) 40 MG capsule, Take  1 capsule  2 x /day -  am & pm  to Prevent Heartburn & Indigestion                                       /           TAKE                    BY                       MOUTH   terbinafine (LAMISIL) 250 MG tablet, Take 1 tablet Daily  for 1 month-  alternating 1 month on & 1 month off (Patient not taking: Reported on 05/22/2021)  Allergies: Allergies  Allergen Reactions   Ace Inhibitors Other (See Comments)    Cough    Citalopram Nausea And Vomiting    Current Problems (verified) has Essential hypertension; Hyperlipidemia, mixed; Vitamin D deficiency; Abnormal glucose; CKD (chronic kidney disease) stage 3, GFR 30-59 ml/min ([place]); Morbid obesity (BMI 35+ with htn, hyperlipidemia, prediabetes) ; Prostate cancer ([place]); Gastroesophageal reflux disease; FHx: heart disease; BPH with obstruction/lower urinary tract symptoms; Bursitis of left hip; and Left hip pain on their problem list.  Screening Tests Health Maintenance  Topic Date Due   Zoster Vaccines- Shingrix (1 of 2) Never done   Fecal DNA (Cologuard)  12/22/2016   COVID-19 Vaccine (3 - Moderna risk series) 05/27/2019   Pneumonia Vaccine 30+ Years old (2 - PCV) 09/09/2020   INFLUENZA VACCINE  10/03/2021   Hepatitis C Screening  Completed   HPV VACCINES  Aged Out   TETANUS/TDAP  Discontinued    Immunization History  Administered Date(s) Administered   Influenza Split 12/23/2013   Influenza, High Dose Seasonal PF 11/25/2014, 03/20/2017, 02/04/2019, 12/14/2019, 12/21/2020   Influenza, Seasonal, Injecte, Preservative Fre 01/17/2016   Moderna Sars-Covid-2 Vaccination 04/01/2019, 04/29/2019    Pneumococcal Polysaccharide-23 12/01/2008, 09/10/2019   Tdap 12/01/2008    Preventative care: Last colonoscopy: never Last cologuard: 2015 OVERDUE - will order today   Prior vaccinations: TD or Tdap: 2010 will get PRN  Influenza: 12/2019 TODAY  Pneumococcal: 2010, 2021 Prevnar13: declines  Shingles/Zostavax: declines Covid 19: 2/2, 2021, moderna  Names of Other Physician/Practitioners you currently use: 1. [facility] here for primary care 2. - , eye doctor, last visit remote  3. Dr. Vanessa Kick, dentist, last visit, 2022  Patient Care Team: Unk Pinto, MD as PCP - General (Internal Medicine) Ardis Hughs, MD as Attending Physician (Urology)  Surgical: He  has a past surgical history that includes Hemorrhoid surgery; Tonsillectomy; and Cystoscopy/retrograde/ureteroscopy (Left, 01/02/2013). Family His family history includes Cancer in his mother; Diabetes in his father; Heart disease in his father. Social history  He reports that he has never smoked. He has never used smokeless tobacco. He reports that he does not drink alcohol and does not use drugs.  MEDICARE WELLNESS OBJECTIVES: Physical activity:   Cardiac risk factors:   Depression/mood screen:      12/21/2020    3:06 PM  Depression screen PHQ 2/9  Decreased Interest 0  Down, Depressed, Hopeless 0  PHQ - 2 Score 0    ADLs:     12/21/2020    3:04 PM  In your present state of health, do you have any difficulty performing the following activities:  Hearing? 0  Vision? 0  Difficulty concentrating or making decisions? 0  Walking or climbing stairs? 0  Dressing or bathing? 0  Doing errands, shopping? 0     Cognitive Testing  Alert? Yes  Normal Appearance?Yes  Oriented to person? Yes  Place? Yes   Time? Yes  Recall of three objects?  Yes  Can perform simple calculations? Yes  Displays appropriate judgment?Yes  Can read the correct time from a watch  face?Yes  EOL planning:     Objective:   There were no vitals filed for this visit.   There is no height or weight on file to calculate BMI.  General appearance: alert, no distress, WD/WN, male HEENT: normocephalic, sclerae anicteric, TMs pearly, nares patent, no discharge  or erythema, pharynx normal Oral cavity: MMM, no lesions Neck: supple, no lymphadenopathy, no thyromegaly, no masses Heart: RRR, normal S1, S2, no murmurs Lungs: CTA bilaterally, no wheezes, rhonchi, or rales Abdomen: +bs, soft, obese, non tender, non distended, no masses, no hepatomegaly, no splenomegaly Musculoskeletal: no swelling, no obvious deformity; left hip with reduced flexion, internal rotation, external rotation. Lateral tenderness over greater trochanter.  Extremities: no edema, no cyanosis, no clubbing Pulses: 2+ symmetric, upper and lower extremities, normal cap refill Neurological: alert, oriented x 3, CN2-12 intact, strength normal upper extremities and lower extremities, sensation normal throughout, DTRs 2+ throughout, no cerebellar signs, gait mildly antalgic Psychiatric: normal affect, behavior normal, pleasant Skin: warm/dry, intact, without concerning rashes, ecchymosis; bil toe nails with normal bases, thick/yellow and brittle distally   Medicare Attestation I have personally reviewed: The patient's medical and social history Their use of alcohol, tobacco or illicit drugs Their current medications and supplements The patient's functional ability including ADLs,fall risks, home safety risks, cognitive, and hearing and visual impairment Diet and physical activities Evidence for depression or mood disorders  The patient's weight, height, BMI, and visual acuity have been recorded in the chart.  I have made referrals, counseling, and provided education to the patient based on review of the above and I have provided the patient with a written personalized care plan for preventive services.      Alycia Rossetti, NP   12/20/2021

## 2021-12-21 ENCOUNTER — Ambulatory Visit: Payer: Medicare HMO | Admitting: Adult Health

## 2021-12-21 ENCOUNTER — Encounter: Payer: Self-pay | Admitting: Nurse Practitioner

## 2021-12-21 ENCOUNTER — Ambulatory Visit (INDEPENDENT_AMBULATORY_CARE_PROVIDER_SITE_OTHER): Payer: Medicare HMO | Admitting: Nurse Practitioner

## 2021-12-21 VITALS — BP 140/80 | HR 71 | Temp 97.7°F | Ht 71.0 in | Wt 269.0 lb

## 2021-12-21 DIAGNOSIS — N182 Chronic kidney disease, stage 2 (mild): Secondary | ICD-10-CM

## 2021-12-21 DIAGNOSIS — Z23 Encounter for immunization: Secondary | ICD-10-CM | POA: Diagnosis not present

## 2021-12-21 DIAGNOSIS — Z0001 Encounter for general adult medical examination with abnormal findings: Secondary | ICD-10-CM

## 2021-12-21 DIAGNOSIS — N1831 Chronic kidney disease, stage 3a: Secondary | ICD-10-CM

## 2021-12-21 DIAGNOSIS — Z79899 Other long term (current) drug therapy: Secondary | ICD-10-CM

## 2021-12-21 DIAGNOSIS — R6889 Other general symptoms and signs: Secondary | ICD-10-CM

## 2021-12-21 DIAGNOSIS — N138 Other obstructive and reflux uropathy: Secondary | ICD-10-CM

## 2021-12-21 DIAGNOSIS — C61 Malignant neoplasm of prostate: Secondary | ICD-10-CM

## 2021-12-21 DIAGNOSIS — Z0000 Encounter for general adult medical examination without abnormal findings: Secondary | ICD-10-CM

## 2021-12-21 DIAGNOSIS — E782 Mixed hyperlipidemia: Secondary | ICD-10-CM | POA: Diagnosis not present

## 2021-12-21 DIAGNOSIS — E559 Vitamin D deficiency, unspecified: Secondary | ICD-10-CM

## 2021-12-21 DIAGNOSIS — N401 Enlarged prostate with lower urinary tract symptoms: Secondary | ICD-10-CM | POA: Diagnosis not present

## 2021-12-21 DIAGNOSIS — M25552 Pain in left hip: Secondary | ICD-10-CM | POA: Diagnosis not present

## 2021-12-21 DIAGNOSIS — G8929 Other chronic pain: Secondary | ICD-10-CM

## 2021-12-21 DIAGNOSIS — I10 Essential (primary) hypertension: Secondary | ICD-10-CM

## 2021-12-21 DIAGNOSIS — R7309 Other abnormal glucose: Secondary | ICD-10-CM | POA: Diagnosis not present

## 2021-12-21 NOTE — Patient Instructions (Signed)

## 2021-12-22 ENCOUNTER — Other Ambulatory Visit: Payer: Self-pay | Admitting: Internal Medicine

## 2021-12-22 LAB — COMPLETE METABOLIC PANEL WITH GFR
AG Ratio: 1.7 (calc) (ref 1.0–2.5)
ALT: 23 U/L (ref 9–46)
AST: 20 U/L (ref 10–35)
Albumin: 4.5 g/dL (ref 3.6–5.1)
Alkaline phosphatase (APISO): 77 U/L (ref 35–144)
BUN: 19 mg/dL (ref 7–25)
CO2: 30 mmol/L (ref 20–32)
Calcium: 9.9 mg/dL (ref 8.6–10.3)
Chloride: 100 mmol/L (ref 98–110)
Creat: 1.11 mg/dL (ref 0.70–1.28)
Globulin: 2.6 g/dL (calc) (ref 1.9–3.7)
Glucose, Bld: 94 mg/dL (ref 65–99)
Potassium: 4.2 mmol/L (ref 3.5–5.3)
Sodium: 140 mmol/L (ref 135–146)
Total Bilirubin: 0.5 mg/dL (ref 0.2–1.2)
Total Protein: 7.1 g/dL (ref 6.1–8.1)
eGFR: 71 mL/min/{1.73_m2} (ref >=60)

## 2021-12-22 LAB — CBC WITH DIFFERENTIAL/PLATELET
Absolute Monocytes: 724 cells/uL (ref 200–950)
Basophils Absolute: 47 cells/uL (ref 0–200)
Basophils Relative: 0.7 %
Eosinophils Absolute: 201 cells/uL (ref 15–500)
Eosinophils Relative: 3 %
HCT: 46.6 % (ref 38.5–50.0)
Hemoglobin: 16.1 g/dL (ref 13.2–17.1)
Lymphs Abs: 1977 cells/uL (ref 850–3900)
MCH: 31.5 pg (ref 27.0–33.0)
MCHC: 34.5 g/dL (ref 32.0–36.0)
MCV: 91.2 fL (ref 80.0–100.0)
MPV: 10.2 fL (ref 7.5–12.5)
Monocytes Relative: 10.8 %
Neutro Abs: 3752 cells/uL (ref 1500–7800)
Neutrophils Relative %: 56 %
Platelets: 235 10*3/uL (ref 140–400)
RBC: 5.11 10*6/uL (ref 4.20–5.80)
RDW: 12 % (ref 11.0–15.0)
Total Lymphocyte: 29.5 %
WBC: 6.7 10*3/uL (ref 3.8–10.8)

## 2021-12-22 LAB — LIPID PANEL
Cholesterol: 176 mg/dL (ref <200)
HDL: 52 mg/dL (ref >=40)
LDL Cholesterol (Calc): 103 mg/dL (calc) — ABNORMAL HIGH
Non-HDL Cholesterol (Calc): 124 mg/dL (calc) (ref <130)
Total CHOL/HDL Ratio: 3.4 (calc) (ref <5.0)
Triglycerides: 116 mg/dL (ref <150)

## 2021-12-22 LAB — MAGNESIUM: Magnesium: 1.9 mg/dL (ref 1.5–2.5)

## 2021-12-22 LAB — PSA: PSA: 0.64 ng/mL (ref <=4.00)

## 2022-01-10 ENCOUNTER — Telehealth: Payer: Self-pay | Admitting: Nurse Practitioner

## 2022-01-10 ENCOUNTER — Other Ambulatory Visit: Payer: Self-pay | Admitting: Nurse Practitioner

## 2022-01-10 DIAGNOSIS — G8929 Other chronic pain: Secondary | ICD-10-CM

## 2022-01-10 NOTE — Telephone Encounter (Signed)
Pt said spoke to you in the last appt that he wanted to get his hips looked at, has already had multiple inj for pain but didn't help. Wanting to get a referral to Dr. Shaune Spittle office if possible

## 2022-01-13 DIAGNOSIS — M1612 Unilateral primary osteoarthritis, left hip: Secondary | ICD-10-CM | POA: Diagnosis not present

## 2022-03-01 DIAGNOSIS — R262 Difficulty in walking, not elsewhere classified: Secondary | ICD-10-CM | POA: Diagnosis not present

## 2022-03-01 DIAGNOSIS — M25652 Stiffness of left hip, not elsewhere classified: Secondary | ICD-10-CM | POA: Diagnosis not present

## 2022-03-01 DIAGNOSIS — M1612 Unilateral primary osteoarthritis, left hip: Secondary | ICD-10-CM | POA: Diagnosis not present

## 2022-03-21 DIAGNOSIS — M1612 Unilateral primary osteoarthritis, left hip: Secondary | ICD-10-CM | POA: Diagnosis not present

## 2022-03-30 DIAGNOSIS — M1612 Unilateral primary osteoarthritis, left hip: Secondary | ICD-10-CM | POA: Diagnosis not present

## 2022-03-30 DIAGNOSIS — Z96642 Presence of left artificial hip joint: Secondary | ICD-10-CM | POA: Diagnosis not present

## 2022-04-02 DIAGNOSIS — R262 Difficulty in walking, not elsewhere classified: Secondary | ICD-10-CM | POA: Diagnosis not present

## 2022-04-02 DIAGNOSIS — R531 Weakness: Secondary | ICD-10-CM | POA: Diagnosis not present

## 2022-04-02 DIAGNOSIS — M25652 Stiffness of left hip, not elsewhere classified: Secondary | ICD-10-CM | POA: Diagnosis not present

## 2022-04-03 ENCOUNTER — Ambulatory Visit: Payer: Medicare HMO | Admitting: Internal Medicine

## 2022-04-06 DIAGNOSIS — R531 Weakness: Secondary | ICD-10-CM | POA: Diagnosis not present

## 2022-04-06 DIAGNOSIS — M25652 Stiffness of left hip, not elsewhere classified: Secondary | ICD-10-CM | POA: Diagnosis not present

## 2022-04-06 DIAGNOSIS — R262 Difficulty in walking, not elsewhere classified: Secondary | ICD-10-CM | POA: Diagnosis not present

## 2022-04-10 DIAGNOSIS — R262 Difficulty in walking, not elsewhere classified: Secondary | ICD-10-CM | POA: Diagnosis not present

## 2022-04-10 DIAGNOSIS — M25652 Stiffness of left hip, not elsewhere classified: Secondary | ICD-10-CM | POA: Diagnosis not present

## 2022-04-10 DIAGNOSIS — R531 Weakness: Secondary | ICD-10-CM | POA: Diagnosis not present

## 2022-04-12 DIAGNOSIS — R531 Weakness: Secondary | ICD-10-CM | POA: Diagnosis not present

## 2022-04-12 DIAGNOSIS — R262 Difficulty in walking, not elsewhere classified: Secondary | ICD-10-CM | POA: Diagnosis not present

## 2022-04-12 DIAGNOSIS — M25652 Stiffness of left hip, not elsewhere classified: Secondary | ICD-10-CM | POA: Diagnosis not present

## 2022-04-17 DIAGNOSIS — R531 Weakness: Secondary | ICD-10-CM | POA: Diagnosis not present

## 2022-04-17 DIAGNOSIS — R262 Difficulty in walking, not elsewhere classified: Secondary | ICD-10-CM | POA: Diagnosis not present

## 2022-04-17 DIAGNOSIS — M25652 Stiffness of left hip, not elsewhere classified: Secondary | ICD-10-CM | POA: Diagnosis not present

## 2022-04-18 ENCOUNTER — Other Ambulatory Visit: Payer: Self-pay | Admitting: Internal Medicine

## 2022-04-18 DIAGNOSIS — B351 Tinea unguium: Secondary | ICD-10-CM

## 2022-04-19 DIAGNOSIS — R262 Difficulty in walking, not elsewhere classified: Secondary | ICD-10-CM | POA: Diagnosis not present

## 2022-04-19 DIAGNOSIS — R531 Weakness: Secondary | ICD-10-CM | POA: Diagnosis not present

## 2022-04-19 DIAGNOSIS — M25652 Stiffness of left hip, not elsewhere classified: Secondary | ICD-10-CM | POA: Diagnosis not present

## 2022-04-24 DIAGNOSIS — M25652 Stiffness of left hip, not elsewhere classified: Secondary | ICD-10-CM | POA: Diagnosis not present

## 2022-04-24 DIAGNOSIS — R262 Difficulty in walking, not elsewhere classified: Secondary | ICD-10-CM | POA: Diagnosis not present

## 2022-04-24 DIAGNOSIS — R531 Weakness: Secondary | ICD-10-CM | POA: Diagnosis not present

## 2022-04-26 DIAGNOSIS — R531 Weakness: Secondary | ICD-10-CM | POA: Diagnosis not present

## 2022-04-26 DIAGNOSIS — M25652 Stiffness of left hip, not elsewhere classified: Secondary | ICD-10-CM | POA: Diagnosis not present

## 2022-04-26 DIAGNOSIS — R262 Difficulty in walking, not elsewhere classified: Secondary | ICD-10-CM | POA: Diagnosis not present

## 2022-05-01 DIAGNOSIS — R262 Difficulty in walking, not elsewhere classified: Secondary | ICD-10-CM | POA: Diagnosis not present

## 2022-05-01 DIAGNOSIS — M25652 Stiffness of left hip, not elsewhere classified: Secondary | ICD-10-CM | POA: Diagnosis not present

## 2022-05-01 DIAGNOSIS — R531 Weakness: Secondary | ICD-10-CM | POA: Diagnosis not present

## 2022-05-03 DIAGNOSIS — R531 Weakness: Secondary | ICD-10-CM | POA: Diagnosis not present

## 2022-05-03 DIAGNOSIS — R262 Difficulty in walking, not elsewhere classified: Secondary | ICD-10-CM | POA: Diagnosis not present

## 2022-05-03 DIAGNOSIS — M25652 Stiffness of left hip, not elsewhere classified: Secondary | ICD-10-CM | POA: Diagnosis not present

## 2022-05-10 ENCOUNTER — Other Ambulatory Visit: Payer: Self-pay | Admitting: Nurse Practitioner

## 2022-06-22 ENCOUNTER — Other Ambulatory Visit: Payer: Self-pay | Admitting: Nurse Practitioner

## 2022-07-30 ENCOUNTER — Encounter: Payer: Self-pay | Admitting: Internal Medicine

## 2022-07-30 NOTE — Progress Notes (Signed)
Annual  Screening/Preventative Visit &  Comprehensive Evaluation & Examination   Future Appointments  Date Time Provider Department  07/31/2022 11:00 AM Jason Cowboy, MD [facility]  10/31/2022  3:00 PM Adela Glimpse, NP [facility]         This very nice 72 y.o. MWM  presents for a Screening /Preventative Visit & comprehensive evaluation and management of multiple medical co-morbidities.  Patient has been followed for HTN, HLD, Prediabetes and Vitamin D Deficiency.  In 2018, patient was dx'd with patient Prostate Cancer treated by Radiation and is followed by Dr. Marlou Porch by q69months for  PSA' s which have been low/stable. Patient reports seeing Pain Mgmt for chronic leg pains & is on Gabapentin.         HTN predates circa 2009. Patient's BP has been controlled at home.   Patient has CKD3a (GFR 59) attributed to his HTN. Today's BP is at goal - 130/76. Patient denies any cardiac symptoms as chest pain, palpitations, shortness of breath, dizziness or ankle swelling.        Patient's hyperlipidemia is controlled with diet. Last lipids were not at goal:  Lab Results  Component Value Date   CHOL 176 12/21/2021   HDL 52 12/21/2021   LDLCALC 103 (H) 12/21/2021   TRIG 116 12/21/2021   CHOLHDL 3.4 12/21/2021         Patient has hx/o  Moderately severe Obesity (BMI 37.38) with consequent prediabetes (A1c 5.9% /2009  and patient denies reactive hypoglycemic symptoms, visual blurring, diabetic polys or paresthesias. Last A1c was near goal :   Lab Results  Component Value Date   HGBA1C 5.9 (H) 07/25/2021          Finally, patient has history of Vitamin D Deficiency ("32" /2009) and last vitamin D was at goal :   Lab Results  Component Value Date   VD25OH 83 07/25/2021       Current Outpatient Medications on File Prior to Visit  Medication Sig   aspirin 81 MG tablet Take 162 mg  daily.    bisoprolol-hctz 10-6.25 MG t TAKE 1 TABLET DAILY    VITAMIN D 5000 UNITS  Take  daily.   losartan 50 MG tablet TAKE 1 TABLET DAILY FOR BP   omeprazole  40 MG capsule TAKE 1 CAPSULE Mon Wed Fri    vitamin B-12  50 MCG tablet Takes 1 tablet every other day.    Allergies  Allergen Reactions   Ace Inhibitors Other (See Comments)    Cough   Citalopram Nausea And Vomiting     Past Medical History:  Diagnosis Date   GERD (gastroesophageal reflux disease)    Hemorrhoids    History of Helicobacter pylori infection    HTN (hypertension)    Hyperlipemia    Obesity    Peripheral vascular disease (HCC) 9/14   DVT  left lower leg   Prostate cancer (HCC)    Vitamin D deficiency      Health Maintenance  Topic Date Due   Zoster Vaccines- Shingrix (1 of 2) Never done   Fecal DNA (Cologuard)  12/22/2016   COVID-19 Vaccine (3 - Moderna risk series) 05/27/2019   Pneumonia Vaccine 39+ Years old  09/09/2020   INFLUENZA VACCINE  10/03/2021   Hepatitis C Screening  Completed   TETANUS/TDAP  Discontinued     Immunization History  Administered Date(s) Administered   Influenza Split 12/23/2013   Influenza, High Dose  03/20/2017, 02/04/2019, 12/14/2019, 12/21/2020   Influenza, Seasonal  01/17/2016   Moderna Sars-Covid-2 Vacc 04/01/2019, 04/29/2019   Pneumococcal -23 12/01/2008, 09/10/2019   Tdap 12/01/2008    - Cologard 12/22/2013 - Negative   - Cologard order 03/21/2020 and 04/10 /2022 - NOT returned    Past Surgical History:  Procedure Laterality Date   CYSTOSCOPY/RETROGRADE/URETEROSCOPY Left 01/02/2013   Procedure: CYSTOSCOPY/LEFT URETERAL WASHINGS/LEFT RETROGRADE PYELOGRAM/ LEFT URETEROSCOPY/URETERAL BIOPSY. BLADDER BIOPSY;  Surgeon: Crist Fat, MD;  Location: [facility];  Service: Urology;  Laterality: Left;  With STENT   HEMORRHOID SURGERY     age 36   TONSILLECTOMY       Family History  Problem Relation Age of Onset   Diabetes Father    Heart disease Father    Cancer Mother        Bladder     Social History   Tobacco Use   Smoking  status: Never   Smokeless tobacco: Never  Substance Use Topics   Alcohol use: No   Drug use: No     ROS Constitutional: Denies fever, chills, weight loss/gain, headaches, insomnia,  night sweats or change in appetite. Does c/o fatigue. Eyes: Denies redness, blurred vision, diplopia, discharge, itchy or watery eyes.  ENT: Denies discharge, congestion, post nasal drip, epistaxis, sore throat, earache, hearing loss, dental pain, Tinnitus, Vertigo, Sinus pain or snoring.  Cardio: Denies chest pain, palpitations, irregular heartbeat, syncope, dyspnea, diaphoresis, orthopnea, PND, claudication or edema Respiratory: denies cough, dyspnea, DOE, pleurisy, hoarseness, laryngitis or wheezing.  Gastrointestinal: Denies dysphagia, heartburn, reflux, water brash, pain, cramps, nausea, vomiting, bloating, diarrhea, constipation, hematemesis, melena, hematochezia, jaundice or hemorrhoids Genitourinary: Denies dysuria, frequency, urgency, nocturia, hesitancy, discharge, hematuria or flank pain Musculoskeletal: Denies arthralgia, myalgia, stiffness, Jt. Swelling, pain, limp or strain/sprain. Denies Falls. Skin: Denies puritis, rash, hives, warts, acne, eczema or change in skin lesion Neuro: No weakness, tremor, incoordination, spasms, paresthesia or pain Psychiatric: Denies confusion, memory loss or sensory loss. Denies Depression. Endocrine: Denies change in weight, skin, hair change, nocturia, and paresthesia, diabetic polys, visual blurring or hyper / hypo glycemic episodes.  Heme/Lymph: No excessive bleeding, bruising or enlarged lymph nodes.   Physical Exam  There were no vitals taken for this visit.  General Appearance: Over nourished  and in no apparent distress.  Eyes: PERRLA, EOMs, conjunctiva no swelling or erythema, normal fundi and vessels. Sinuses: No frontal/maxillary tenderness ENT/Mouth: EACs patent / TMs  nl. Nares clear without erythema, swelling, mucoid exudates. Oral hygiene is good.  No erythema, swelling, or exudate. Tongue normal, non-obstructing. Tonsils not swollen or erythematous. Hearing normal.  Neck: Supple, thyroid not palpable. No bruits, nodes or JVD. Respiratory: Respiratory effort normal.  BS equal and clear bilateral without rales, rhonci, wheezing or stridor. Cardio: Heart sounds are normal with regular rate and rhythm and no murmurs, rubs or gallops. Peripheral pulses are normal and equal bilaterally without edema. No aortic or femoral bruits. Chest: symmetric with normal excursions and percussion.  Abdomen: Soft, with Nl bowel sounds. Nontender, no guarding, rebound, hernias, masses, or organomegaly.  Lymphatics: Non tender without lymphadenopathy.  Musculoskeletal: Full ROM all peripheral extremities, joint stability, 5/5 strength, and normal gait. Skin: Warm and dry without rashes, lesions, cyanosis, clubbing or  ecchymosis.  Neuro: Cranial nerves intact, reflexes equal bilaterally. Normal muscle tone, no cerebellar symptoms. Sensation intact.  Pysch: Alert and oriented X 3 with normal affect, insight and judgment appropriate.   Assessment and Plan  1. Annual Preventative/Screening Exam    2. Essential hypertension  - EKG 12-Lead - [place], RETROPERITNL  ABD,  LTD - Urinalysis, Routine w reflex microscopic - Microalbumin / creatinine urine ratio - CBC with Differential/Platelet - COMPLETE METABOLIC PANEL WITH GFR - Magnesium - TSH  3. Hyperlipidemia, mixed  - EKG 12-Lead - [place], RETROPERITNL ABD,  LTD - Lipid panel - TSH  4. Abnormal glucose  - EKG 12-Lead - [place], RETROPERITNL ABD,  LTD - Hemoglobin A1c - Insulin, random  5. Vitamin D deficiency  - VITAMIN D 25 Hydroxy   6. Prostate cancer (HCC)   7. Stage 3a chronic kidney disease ([facility]CC)  - Urinalysis, Routine w reflex microscopic - Microalbumin / creatinine urine ratio - PTH, intact and calcium  8. Morbid obesity (BMI 35+ with htn, hyperlipidemia, prediabetes)   - TSH  9.  Screening for heart disease  - EKG 12-Lead  10. FHx: heart disease  - EKG 12-Lead - [place], RETROPERITNL ABD,  LTD  11. Screening for AAA (aortic abdominal aneurysm)  - [place], RETROPERITNL ABD,  LTD  12. Medication management - Urinalysis, Routine w reflex microscopic - Microalbumin / creatinine urine ratio - CBC with Differential/Platelet - COMPLETE METABOLIC PANEL WITH GFR - Magnesium - Lipid panel - TSH - Hemoglobin A1c - Insulin, random - VITAMIN D 25 Hydroxy          Patient was counseled in prudent diet, weight control to achieve/maintain BMI less than 25, BP monitoring, regular exercise and medications as discussed.  Discussed med effects and SE's. Routine screening labs and tests as requested with regular follow-up as recommended. Over 40 minutes of exam, counseling, chart review and high complex critical decision making was performed   Marinus Maw, MD

## 2022-07-30 NOTE — Patient Instructions (Signed)

## 2022-07-31 ENCOUNTER — Encounter: Payer: Self-pay | Admitting: Internal Medicine

## 2022-07-31 ENCOUNTER — Ambulatory Visit (INDEPENDENT_AMBULATORY_CARE_PROVIDER_SITE_OTHER): Payer: Medicare HMO | Admitting: Internal Medicine

## 2022-07-31 VITALS — BP 138/84 | HR 67 | Temp 97.9°F | Resp 16 | Ht 69.5 in | Wt 269.8 lb

## 2022-07-31 DIAGNOSIS — Z79899 Other long term (current) drug therapy: Secondary | ICD-10-CM | POA: Diagnosis not present

## 2022-07-31 DIAGNOSIS — Z125 Encounter for screening for malignant neoplasm of prostate: Secondary | ICD-10-CM

## 2022-07-31 DIAGNOSIS — I700 Atherosclerosis of aorta: Secondary | ICD-10-CM | POA: Diagnosis not present

## 2022-07-31 DIAGNOSIS — Z8249 Family history of ischemic heart disease and other diseases of the circulatory system: Secondary | ICD-10-CM

## 2022-07-31 DIAGNOSIS — Z136 Encounter for screening for cardiovascular disorders: Secondary | ICD-10-CM

## 2022-07-31 DIAGNOSIS — Z1211 Encounter for screening for malignant neoplasm of colon: Secondary | ICD-10-CM

## 2022-07-31 DIAGNOSIS — I10 Essential (primary) hypertension: Secondary | ICD-10-CM | POA: Diagnosis not present

## 2022-07-31 DIAGNOSIS — R7309 Other abnormal glucose: Secondary | ICD-10-CM | POA: Diagnosis not present

## 2022-07-31 DIAGNOSIS — C61 Malignant neoplasm of prostate: Secondary | ICD-10-CM

## 2022-07-31 DIAGNOSIS — E559 Vitamin D deficiency, unspecified: Secondary | ICD-10-CM | POA: Diagnosis not present

## 2022-07-31 DIAGNOSIS — N1831 Chronic kidney disease, stage 3a: Secondary | ICD-10-CM | POA: Diagnosis not present

## 2022-07-31 DIAGNOSIS — Z0000 Encounter for general adult medical examination without abnormal findings: Secondary | ICD-10-CM | POA: Diagnosis not present

## 2022-07-31 DIAGNOSIS — E782 Mixed hyperlipidemia: Secondary | ICD-10-CM

## 2022-07-31 DIAGNOSIS — Z0001 Encounter for general adult medical examination with abnormal findings: Secondary | ICD-10-CM

## 2022-07-31 DIAGNOSIS — M159 Polyosteoarthritis, unspecified: Secondary | ICD-10-CM

## 2022-07-31 LAB — CBC WITH DIFFERENTIAL/PLATELET
MCV: 89 fL (ref 80.0–100.0)
RBC: 5.1 10*6/uL (ref 4.20–5.80)
Total Lymphocyte: 25.8 %

## 2022-07-31 MED ORDER — MELOXICAM 15 MG PO TABS
ORAL_TABLET | ORAL | 3 refills | Status: DC
Start: 2022-07-31 — End: 2022-11-06

## 2022-08-01 LAB — CBC WITH DIFFERENTIAL/PLATELET
Basophils Absolute: 43 cells/uL (ref 0–200)
Eosinophils Absolute: 185 cells/uL (ref 15–500)
Eosinophils Relative: 2.6 %
MCH: 30.8 pg (ref 27.0–33.0)
Neutro Abs: 4345 cells/uL (ref 1500–7800)
Platelets: 226 10*3/uL (ref 140–400)
RDW: 12.4 % (ref 11.0–15.0)

## 2022-08-01 LAB — COMPLETE METABOLIC PANEL WITH GFR
AG Ratio: 1.6 (calc) (ref 1.0–2.5)
ALT: 15 U/L (ref 9–46)
Albumin: 4.6 g/dL (ref 3.6–5.1)
CO2: 26 mmol/L (ref 20–32)
Calcium: 10.1 mg/dL (ref 8.6–10.3)
Chloride: 99 mmol/L (ref 98–110)
Creat: 1.11 mg/dL (ref 0.70–1.28)
Glucose, Bld: 102 mg/dL — ABNORMAL HIGH (ref 65–99)
Potassium: 4.4 mmol/L (ref 3.5–5.3)
Sodium: 139 mmol/L (ref 135–146)
Total Bilirubin: 0.3 mg/dL (ref 0.2–1.2)

## 2022-08-01 LAB — URINALYSIS, ROUTINE W REFLEX MICROSCOPIC
Hgb urine dipstick: NEGATIVE
Ketones, ur: NEGATIVE
Specific Gravity, Urine: 1.025 (ref 1.001–1.035)

## 2022-08-01 LAB — INSULIN, RANDOM: Insulin: 11.6 u[IU]/mL

## 2022-08-01 LAB — PTH, INTACT AND CALCIUM: Calcium: 10.1 mg/dL (ref 8.6–10.3)

## 2022-08-01 LAB — MAGNESIUM: Magnesium: 2.1 mg/dL (ref 1.5–2.5)

## 2022-08-01 LAB — LIPID PANEL
HDL: 59 mg/dL (ref >=40)
Non-HDL Cholesterol (Calc): 130 mg/dL (calc) — ABNORMAL HIGH (ref <130)

## 2022-08-01 NOTE — Progress Notes (Signed)
^<^<^<^<^<^<^<^<^<^<^<^<^<^<^<^<^<^<^<^<^<^<^<^<^<^<^<^<^<^<^<^<^<^<^<^<^ ^>^>^>^>^>^>^>^>^>^>^>>^>^>^>^>^>^>^>^>^>^>^>^>^>^>^>^>^>^>^>^>^>^>^>^>^>  - Chol = 189 -  Excellent   - Very low risk for Heart Attack  / Stroke  -  But the "bad" LDL Chol = 109 is elevated   - Ideal LDL is less tyhan 70 , So . . .   - Recommend low cholesterol diet   - Cholesterol only comes from animal sources                                                                             - ie. meat, dairy, egg yolks  - Eat all the vegetables you want.  - Avoid Meat, Avoid Meat,  Avoid Meat                                                            - especially Red Meat - Beef AND Pork .  - Avoid cheese & dairy - milk & ice cream.     - Cheese is the most concentrated form of trans-fats which  is the worst thing to clog up our arteries.   - Veggie cheese is OK which can be found in the fresh  produce section at [employer] or [employer] or [employer]  ^<^<^<^<^<^<^<^<^<^<^<^<^<^<^<^<^<^<^<^<^<^<^<^<^<^<^<^<^<^<^<^<^<^<^<^<^ ^>^>^>^>^>^>^>^>^>^>^>^>^>^>^>^>^>^>^>^>^>^>^>^>^>^>^>^>^>^>^>^>^>^>^>^>^  -  A1c = 6.3% is getting too high                ( Ideal is less than 5.7%  and 6.5% is officially Full fledged Diabetic  ! )   - Being diabetic has a  300% increased risk for heart attack,                                            stroke, cancer, and alzheimer- type vascular dementia.    It is very important that you work harder with diet by                             avoiding all foods that are white except chicken, fish & calliflower.  - Avoid white rice  (brown & wild rice is OK),   - Avoid white potatoes  (sweet potatoes in moderation is OK),   White bread or wheat bread or anything made out of   white flour like bagels, donuts, rolls, buns, biscuits, cakes,  - pastries, cookies, pizza crust, and pasta (made from  white flour & egg whites)   - vegetarian pasta or spinach or wheat pasta  is OK.  - Multigrain breads like Arnold's, [employer] or   multigrain sandwich thins or high fiber breads like   [place] bread or "Dave's Killer" breads that are  4 to 5 grams fiber per slice !  are best.    Diet, exercise and weight loss can reverse and cure  diabetes in the early stages.    - Diet, exercise and weight loss is very important  in the   control and prevention of complications of diabetes which  affects every system in your body, ie.   -Brain - dementia/stroke,  - eyes - glaucoma/blindness,  - heart - heart attack/heart failure,  - kidneys - dialysis,  - stomach - gastric paralysis,  - intestines - malabsorption,  - nerves - severe painful neuritis,  - circulation - gangrene & loss of a leg(s)  - and finally  . . . . . . . . . . . . . . . . . .    - cancer and Alzheimers.  ^<^<^<^<^<^<^<^<^<^<^<^<^<^<^<^<^<^<^<^<^<^<^<^<^<^<^<^<^<^<^<^<^<^<^<^<^ ^>^>^>^>^>^>^>^>^>^>^>^>^>^>^>^>^>^>^>^>^>^>^>^>^>^>^>^>^>^>^>^>^>^>^>^>^  -  Vitamin D = 85 - Excellent  - Please keep dose same   ^<^<^<^<^<^<^<^<^<^<^<^<^<^<^<^<^<^<^<^<^<^<^<^<^<^<^<^<^<^<^<^<^<^<^<^<^ ^>^>^>^>^>^>^>^>^>^>^>^>^>^>^>^>^>^>^>^>^>^>^>^>^>^>^>^>^>^>^>^>^>^>^>^>^  -  All Else - CBC - Kidneys - Electrolytes - Liver - Magnesium & Thyroid    - all  Normal / OK ^<^<^<^<^<^<^<^<^<^<^<^<^<^<^<^<^<^<^<^<^<^<^<^<^<^<^<^<^<^<^<^<^<^<^<^<^ ^>^>^>^>^>^>^>^>^>^>^>^>^>^>^>^>^>^>^>^>^>^>^>^>^>^>^>^>^>^>^>^>^>^>^>^>^

## 2022-08-02 LAB — COMPLETE METABOLIC PANEL WITH GFR
AST: 17 U/L (ref 10–35)
Alkaline phosphatase (APISO): 77 U/L (ref 35–144)
BUN: 17 mg/dL (ref 7–25)
Globulin: 2.8 g/dL (calc) (ref 1.9–3.7)
Total Protein: 7.4 g/dL (ref 6.1–8.1)
eGFR: 70 mL/min/{1.73_m2} (ref >=60)

## 2022-08-02 LAB — TSH: TSH: 1.27 mIU/L (ref 0.40–4.50)

## 2022-08-02 LAB — URINALYSIS, ROUTINE W REFLEX MICROSCOPIC
Bilirubin Urine: NEGATIVE
Glucose, UA: NEGATIVE
Leukocytes,Ua: NEGATIVE
Nitrite: NEGATIVE
Protein, ur: NEGATIVE
pH: <=5 (ref 5.0–8.0)

## 2022-08-02 LAB — CBC WITH DIFFERENTIAL/PLATELET
Absolute Monocytes: 696 cells/uL (ref 200–950)
Basophils Relative: 0.6 %
HCT: 45.4 % (ref 38.5–50.0)
Hemoglobin: 15.7 g/dL (ref 13.2–17.1)
Lymphs Abs: 1832 cells/uL (ref 850–3900)
MCHC: 34.6 g/dL (ref 32.0–36.0)
MPV: 10 fL (ref 7.5–12.5)
Monocytes Relative: 9.8 %
Neutrophils Relative %: 61.2 %
WBC: 7.1 10*3/uL (ref 3.8–10.8)

## 2022-08-02 LAB — PTH, INTACT AND CALCIUM: PTH: 56 pg/mL (ref 16–77)

## 2022-08-02 LAB — MICROALBUMIN / CREATININE URINE RATIO
Creatinine, Urine: 138 mg/dL (ref 20–320)
Microalb Creat Ratio: 5 mg/g creat (ref <30)
Microalb, Ur: 0.7 mg/dL

## 2022-08-02 LAB — HEMOGLOBIN A1C
Hgb A1c MFr Bld: 6.3 % of total Hgb — ABNORMAL HIGH (ref <5.7)
Mean Plasma Glucose: 134 mg/dL
eAG (mmol/L): 7.4 mmol/L

## 2022-08-02 LAB — LIPID PANEL
Cholesterol: 189 mg/dL (ref <200)
LDL Cholesterol (Calc): 109 mg/dL (calc) — ABNORMAL HIGH
Total CHOL/HDL Ratio: 3.2 (calc) (ref <5.0)
Triglycerides: 104 mg/dL (ref <150)

## 2022-08-02 LAB — VITAMIN D 25 HYDROXY (VIT D DEFICIENCY, FRACTURES): Vit D, 25-Hydroxy: 85 ng/mL (ref 30–100)

## 2022-10-19 ENCOUNTER — Other Ambulatory Visit: Payer: Self-pay | Admitting: Internal Medicine

## 2022-10-19 DIAGNOSIS — B351 Tinea unguium: Secondary | ICD-10-CM

## 2022-10-31 ENCOUNTER — Encounter: Payer: Self-pay | Admitting: Nurse Practitioner

## 2022-10-31 ENCOUNTER — Ambulatory Visit (INDEPENDENT_AMBULATORY_CARE_PROVIDER_SITE_OTHER): Payer: Medicare HMO | Admitting: Nurse Practitioner

## 2022-10-31 VITALS — BP 138/82 | HR 64 | Temp 97.9°F | Ht 71.0 in | Wt 272.6 lb

## 2022-10-31 DIAGNOSIS — K219 Gastro-esophageal reflux disease without esophagitis: Secondary | ICD-10-CM | POA: Diagnosis not present

## 2022-10-31 DIAGNOSIS — N138 Other obstructive and reflux uropathy: Secondary | ICD-10-CM | POA: Diagnosis not present

## 2022-10-31 DIAGNOSIS — I10 Essential (primary) hypertension: Secondary | ICD-10-CM | POA: Diagnosis not present

## 2022-10-31 DIAGNOSIS — C61 Malignant neoplasm of prostate: Secondary | ICD-10-CM

## 2022-10-31 DIAGNOSIS — E559 Vitamin D deficiency, unspecified: Secondary | ICD-10-CM

## 2022-10-31 DIAGNOSIS — N401 Enlarged prostate with lower urinary tract symptoms: Secondary | ICD-10-CM | POA: Diagnosis not present

## 2022-10-31 DIAGNOSIS — E782 Mixed hyperlipidemia: Secondary | ICD-10-CM

## 2022-10-31 DIAGNOSIS — R7309 Other abnormal glucose: Secondary | ICD-10-CM | POA: Diagnosis not present

## 2022-10-31 DIAGNOSIS — N182 Chronic kidney disease, stage 2 (mild): Secondary | ICD-10-CM | POA: Diagnosis not present

## 2022-10-31 NOTE — Progress Notes (Signed)
FOLLOW UP Assessment:   Jason Patton was seen today for medicare wellness and follow-up.  Diagnoses and all orders for this visit:  Essential hypertension Discussed DASH (Dietary Approaches to Stop Hypertension) DASH diet is lower in sodium than a typical American diet. Cut back on foods that are high in saturated fat, cholesterol, and trans fats. Eat more whole-grain foods, fish, poultry, and nuts Remain active and exercise as tolerated daily.  Monitor BP at home-Call if greater than 130/80.   Gastroesophageal reflux disease, unspecified whether esophagitis present No suspected reflux complications (Barret/stricture). Lifestyle modification:  wt loss, avoid meals 2-3h before bedtime. Consider eliminating food triggers:  chocolate, caffeine, EtOH, acid/spicy food.  Prostate cancer [facility]) S/p radiation, urology following  Monitor PSA  CKD Stage 2 Discussed how what you eat and drink can aide in kidney protection. Stay well hydrated. Avoid high salt foods. Avoid NSAIDS. Keep BP and BG well controlled.   Take medications as prescribed. Remain active and exercise as tolerated daily. Maintain weight.  Continue to monitor.  BPH with obstruction/lower urinary tract symptoms Controlled Monitor; urology following  Vitamin D deficiency Continue supplement for goal of 60-100 Monitor Vitamin D levels  Other abnormal glucose (prediabetes) Education: Reviewed 'ABCs' of diabetes management  Discussed goals to be met and/or maintained include A1C (<7) Blood pressure (<130/80) Cholesterol (LDL <70) Continue Eye Exam yearly  Continue Dental Exam Q6 mo Discussed dietary recommendations Discussed Physical Activity recommendations  Morbid obesity (BMI 35+ with htn, hyperlipidemia, prediabetes)  Discussed appropriate BMI Diet modification. Physical activity. Encouraged/praised to build confidence.  Hyperlipidemia, mixed Discussed lifestyle modifications. Recommended diet heavy in  fruits and veggies, omega 3's. Decrease consumption of animal meats, cheeses, and dairy products. Remain active and exercise as tolerated. Continue to monitor.  Review of lab work stable - obtain NOV.  Notify office for further evaluation and treatment, questions or concerns if any reported s/s fail to improve.   The patient was advised to call back or seek an in-person evaluation if any symptoms worsen or if the condition fails to improve as anticipated.   Further disposition pending results of labs. Discussed med's effects and SE's.    I discussed the assessment and treatment plan with the patient. The patient was provided an opportunity to ask questions and all were answered. The patient agreed with the plan and demonstrated an understanding of the instructions.  Discussed med's effects and SE's. Screening labs and tests as requested with regular follow-up as recommended.  I provided 30 minutes of face-to-face time during this encounter including counseling, chart review, and critical decision making was preformed.  Today's Plan of Care is based on a patient-centered health care approach known as shared decision making - the decisions, tests and treatments allow for patient preferences and values to be balanced with clinical evidence.      Future Appointments  Date Time Provider Department Center  12/25/2022  3:00 PM Adela Glimpse, NP [facility] None  02/07/2023  3:30 PM Lucky Cowboy, MD [facility] None  05/14/2023  3:30 PM Adela Glimpse, NP [facility] None  08/19/2023  3:00 PM Lucky Cowboy, MD [facility] None     Subjective:  Jason Patton. is a 72 y.o. male who presents for a 3 month follow up for HTN, hyperlipidemia, prediabetes, obesity and vitamin D Def.   Overall he reports feeling well today.  He has no new concerns at this time.  He had R top #1 molar removed with complication - was on long term bx by  Dr. Loletta Specter, most recently has been referred to ENT  specialist at [facility].  Had muscle from jaw put in his sinuses. He is now doing well.   BMI is Body mass index is 38.02 kg/m., he has not been working on diet, no intentional exercise. Wt Readings from Last 3 Encounters:  10/31/22 272 lb 9.6 oz (123.7 kg)  07/31/22 269 lb 12.8 oz (122.4 kg)  12/21/21 269 lb (122 kg)    His blood pressure has been controlled at home has been running a little bit higher with his hip pain, he takes is taking his bp med at 5 AM, today their BP is BP: 138/82  BP Readings from Last 3 Encounters:  10/31/22 138/82  07/31/22 138/84  12/21/21 (!) 140/80  He does not workout. He denies chest pain, shortness of breath, dizziness.    He is not on cholesterol medication and denies myalgias. His cholesterol is at goal. The cholesterol last visit was:   Lab Results  Component Value Date   CHOL 189 07/31/2022   HDL 59 07/31/2022   LDLCALC 109 (H) 07/31/2022   TRIG 104 07/31/2022   CHOLHDL 3.2 07/31/2022   He has not been working on diet and exercise for prediabetes, and denies increased appetite, nausea, paresthesia of the feet, polydipsia, polyuria and visual disturbances. Last A1C in the office was:  Lab Results  Component Value Date   HGBA1C 6.3 (H) 07/31/2022   He has stable CKD stage 2 range. Drinking more water Last GFR Lab Results  Component Value Date   EGFR 70 07/31/2022     Patient is on Vitamin D supplement.   Lab Results  Component Value Date   VD25OH 53 07/31/2022     Patient was diagnosed with Prostate Cancer in 2018 and was treated by 40 Radiation sessions July 5 thru Aug 29 and was followed by Dr. Marlou Porch however hasn't followed up since 05/2019 per notes in media.   Lab Results  Component Value Date   PSA 0.64 12/21/2021   PSA 0.37 12/21/2020   PSA 0.2 06/04/2019     Medication Review:   Current Outpatient Medications (Cardiovascular):    bisoprolol-hydrochlorothiazide (ZIAC) 10-6.25 MG tablet, TAKE 1 TABLET BY MOUTH DAILY FOR  BLOOD PRESSURE   losartan (COZAAR) 50 MG tablet, TAKE 1 TABLET BY MOUTH DAILY FOR BLOOD PRESSURE   Current Outpatient Medications (Analgesics):    aspirin 81 MG tablet, Take 162 mg by mouth daily.    meloxicam (MOBIC) 15 MG tablet, Take 1/2 to 1 tablet Daily for Pain & Inflammation (Patient not taking: Reported on 10/31/2022)  Current Outpatient Medications (Hematological):    vitamin B-12 (CYANOCOBALAMIN) 50 MCG tablet, Take 50 mcg by mouth. Takes 1 tablet every other day.  Current Outpatient Medications (Other):    Cholecalciferol (VITAMIN D3) 5000 UNITS CAPS, Take 5,000 Units by mouth daily.   omeprazole (PRILOSEC) 40 MG capsule, Take  1 capsule  2 x /day -  am & pm  to Prevent Heartburn & Indigestion                                       /           TAKE                    BY  MOUTH   terbinafine (LAMISIL) 250 MG tablet, TAKE 1 TABLET DAILY FOR 1 MONTH. ALTERNATING 1 MONTH ON AND 1 MONTH OFF.   gabapentin (NEURONTIN) 300 MG capsule, Take  1 capsule  3 to 4 x /day  as needed for Pain (Patient not taking: Reported on 05/22/2021)  Allergies: Allergies  Allergen Reactions   Ace Inhibitors Other (See Comments)    Cough    Citalopram Nausea And Vomiting    Current Problems (verified) has Essential hypertension; Hyperlipidemia, mixed; Vitamin D deficiency; Abnormal glucose; CKD (chronic kidney disease) stage 3, GFR 30-59 ml/min (HCC); Morbid obesity (BMI 35+ with htn, hyperlipidemia, prediabetes) ; Prostate cancer (HCC); Gastroesophageal reflux disease; FHx: heart disease; BPH with obstruction/lower urinary tract symptoms; Bursitis of left hip; and Left hip pain on their problem list.  Screening Tests Health Maintenance  Topic Date Due   Zoster Vaccines- Shingrix (1 of 2) Never done   DTaP/Tdap/Td (2 - Td or Tdap) 12/02/2018   COVID-19 Vaccine (3 - Moderna risk series) 05/27/2019   INFLUENZA VACCINE  10/04/2022   Medicare Annual Wellness (AWV)  12/22/2022   Fecal  DNA (Cologuard)  12/22/2022 (Originally 12/22/2016)   Pneumonia Vaccine 8+ Years old  Completed   Hepatitis C Screening  Completed   HPV VACCINES  Aged Out    Immunization History  Administered Date(s) Administered   Influenza Split 12/23/2013   Influenza, High Dose Seasonal PF 11/25/2014, 03/20/2017, 02/04/2019, 12/14/2019, 12/21/2020, 12/21/2021   Influenza, Seasonal, Injecte, Preservative Fre 01/17/2016   Moderna Sars-Covid-2 Vaccination 04/01/2019, 04/29/2019   PNEUMOCOCCAL CONJUGATE-20 12/21/2021   Pneumococcal Polysaccharide-23 12/01/2008, 09/10/2019   Tdap 12/01/2008   Patient Care Team: Lucky Cowboy, MD as PCP - General (Internal Medicine) Crist Fat, MD as Attending Physician (Urology)  Surgical: He  has a past surgical history that includes Hemorrhoid surgery; Tonsillectomy; and Cystoscopy/retrograde/ureteroscopy (Left, 01/02/2013). Family His family history includes Cancer in his mother; Diabetes in his father; Heart disease in his father. Social history  He reports that he has never smoked. He has never used smokeless tobacco. He reports that he does not drink alcohol and does not use drugs.  Objective:   Today's Vitals   10/31/22 1457  BP: 138/82  Pulse: 64  Temp: 97.9 F (36.6 C)  SpO2: 99%  Weight: 272 lb 9.6 oz (123.7 kg)  Height: 5\' 11"  (1.803 m)   Body mass index is 38.02 kg/m.  General appearance: alert, no distress, WD/WN, male HEENT: normocephalic, sclerae anicteric, TMs pearly, nares patent, no discharge or erythema, pharynx normal Oral cavity: MMM, no lesions Neck: supple, no lymphadenopathy, no thyromegaly, no masses Heart: RRR, normal S1, S2, no murmurs Lungs: CTA bilaterally, no wheezes, rhonchi, or rales Abdomen: +bs, soft, obese, non tender, non distended, no masses, no hepatomegaly, no splenomegaly Musculoskeletal: no swelling, no obvious deformity; left hip with reduced flexion, internal rotation, external rotation. Lateral  tenderness over greater trochanter.  Extremities: no edema, no cyanosis, no clubbing Pulses: 2+ symmetric, upper and lower extremities, normal cap refill Neurological: alert, oriented x 3, CN2-12 intact, strength normal upper extremities and lower extremities, sensation normal throughout, DTRs 2+ throughout, no cerebellar signs, gait mildly antalgic Psychiatric: normal affect, behavior normal, pleasant Skin: warm/dry, intact, without concerning rashes, ecchymosis; bil toe nails with normal bases, thick/yellow and brittle distally    Adela Glimpse, NP   11/06/2022

## 2022-11-06 ENCOUNTER — Other Ambulatory Visit: Payer: Self-pay | Admitting: Nurse Practitioner

## 2022-11-06 ENCOUNTER — Telehealth: Payer: Self-pay

## 2022-11-06 MED ORDER — CELECOXIB 100 MG PO CAPS: 100.0000 mg | ORAL_CAPSULE | Freq: Two times a day (BID) | ORAL | 0 refills | Status: DC

## 2022-11-06 NOTE — Telephone Encounter (Signed)
Patient states that something was supposed to be sent in to the pharmacy. Patient thinks its for his arthritis. Cannot take Meloxicam. Please advise.

## 2022-11-06 NOTE — Patient Instructions (Signed)

## 2022-11-07 NOTE — Telephone Encounter (Signed)
Patient advised.

## 2022-12-20 ENCOUNTER — Other Ambulatory Visit: Payer: Self-pay | Admitting: Nurse Practitioner

## 2022-12-25 ENCOUNTER — Encounter: Payer: Self-pay | Admitting: Nurse Practitioner

## 2022-12-25 ENCOUNTER — Ambulatory Visit (INDEPENDENT_AMBULATORY_CARE_PROVIDER_SITE_OTHER): Payer: Medicare HMO | Admitting: Nurse Practitioner

## 2022-12-25 VITALS — BP 120/82 | HR 85 | Temp 97.7°F | Ht 71.0 in | Wt 272.6 lb

## 2022-12-25 DIAGNOSIS — N401 Enlarged prostate with lower urinary tract symptoms: Secondary | ICD-10-CM | POA: Diagnosis not present

## 2022-12-25 DIAGNOSIS — R7309 Other abnormal glucose: Secondary | ICD-10-CM | POA: Diagnosis not present

## 2022-12-25 DIAGNOSIS — Z0000 Encounter for general adult medical examination without abnormal findings: Secondary | ICD-10-CM

## 2022-12-25 DIAGNOSIS — Z23 Encounter for immunization: Secondary | ICD-10-CM

## 2022-12-25 DIAGNOSIS — R6889 Other general symptoms and signs: Secondary | ICD-10-CM | POA: Diagnosis not present

## 2022-12-25 DIAGNOSIS — N182 Chronic kidney disease, stage 2 (mild): Secondary | ICD-10-CM

## 2022-12-25 DIAGNOSIS — C61 Malignant neoplasm of prostate: Secondary | ICD-10-CM

## 2022-12-25 DIAGNOSIS — N138 Other obstructive and reflux uropathy: Secondary | ICD-10-CM | POA: Diagnosis not present

## 2022-12-25 DIAGNOSIS — K219 Gastro-esophageal reflux disease without esophagitis: Secondary | ICD-10-CM

## 2022-12-25 DIAGNOSIS — M25552 Pain in left hip: Secondary | ICD-10-CM | POA: Diagnosis not present

## 2022-12-25 DIAGNOSIS — Z0001 Encounter for general adult medical examination with abnormal findings: Secondary | ICD-10-CM

## 2022-12-25 DIAGNOSIS — I10 Essential (primary) hypertension: Secondary | ICD-10-CM

## 2022-12-25 DIAGNOSIS — E559 Vitamin D deficiency, unspecified: Secondary | ICD-10-CM | POA: Diagnosis not present

## 2022-12-25 DIAGNOSIS — E782 Mixed hyperlipidemia: Secondary | ICD-10-CM | POA: Diagnosis not present

## 2022-12-25 MED ORDER — DULOXETINE HCL 30 MG PO CPEP: 30.0000 mg | ORAL_CAPSULE | Freq: Every day | ORAL | 2 refills | Status: DC

## 2022-12-25 NOTE — Patient Instructions (Signed)
Duloxetine Delayed-Release Capsules What is this medication? DULOXETINE (doo LOX e teen) treats depression, anxiety, fibromyalgia, and certain types of chronic pain such as nerve, bone, or joint pain. It increases the amount of serotonin and norepinephrine in the brain, hormones that help regulate mood and pain. It belongs to a group of medications called SNRIs. This medicine may be used for other purposes; ask your health care provider or pharmacist if you have questions. COMMON BRAND NAME(S): Cymbalta, Drizalma, Irenka What should I tell my care team before I take this medication? They need to know if you have any of these conditions: Bipolar disorder Glaucoma High blood pressure Kidney disease Liver disease Seizures Suicidal thoughts, plans, or attempt by you or a family member Take medications that treat or prevent blood clots Taken an MAOI, such as Carbex, Eldepryl, Marplan, Nardil, or Parnate in the last 14 days Trouble passing urine An unusual reaction to duloxetine, other medications, foods, dyes, or preservatives Pregnant or trying to get pregnant Breastfeeding How should I use this medication? Take this medication by mouth with water. Take it as directed on the prescription label at the same time every day. Do not cut, crush, or chew this medication. Swallow the capsules whole. Some capsules may be opened and sprinkled on applesauce. Check with your care team or pharmacist if you are not sure. You can take this medication with or without food. Do not take your medication more often than directed. Do not stop taking this medication suddenly except upon the advice of your care team. Stopping this medication too quickly may cause serious side effects or your condition may worsen. A special MedGuide will be given to you by the pharmacist with each prescription and refill. Be sure to read this information carefully each time. Talk to your care team about the use of this medication in  children. While it may be prescribed for children as young as 7 years for selected conditions, precautions do apply. Overdosage: If you think you have taken too much of this medicine contact a poison control center or emergency room at once. NOTE: This medicine is only for you. Do not share this medicine with others. What if I miss a dose? If you miss a dose, take it as soon as you can. If it is almost time for your next dose, take only that dose. Do not take double or extra doses. What may interact with this medication? Do not take this medication with any of the following: Desvenlafaxine Levomilnacipran Linezolid MAOIs, such as Carbex, Eldepryl, Emsam, Marplan, Nardil, and Parnate Methylene blue (injected into a vein) Milnacipran Safinamide Thioridazine Venlafaxine Viloxazine This medication may also interact with the following: Alcohol Amphetamines Aspirin and aspirin-like medications Certain antibiotics, such as ciprofloxacin and enoxacin Certain medications for blood pressure, heart disease, irregular heart beat Certain medications for mental health conditions Certain medications for migraine headache, such as almotriptan, eletriptan, frovatriptan, naratriptan, rizatriptan, sumatriptan, zolmitriptan Certain medications that treat or prevent blood clots, such as warfarin, enoxaparin, and dalteparin Cimetidine Fentanyl Lithium NSAIDS, medications for pain and inflammation, such as ibuprofen or naproxen Phentermine Procarbazine Rasagiline Sibutramine St. John's Wort Theophylline Tramadol Tryptophan This list may not describe all possible interactions. Give your health care provider a list of all the medicines, herbs, non-prescription drugs, or dietary supplements you use. Also tell them if you smoke, drink alcohol, or use illegal drugs. Some items may interact with your medicine. What should I watch for while using this medication? Tell your care team if your  symptoms do not  get better or if they get worse. Visit your care team for regular checks on your progress. Because it may take several weeks to see the full effects of this medication, it is important to continue your treatment as prescribed by your care team. This medication may cause serious skin reactions. They can happen weeks to months after starting the medication. Contact your care team right away if you notice fevers or flu-like symptoms with a rash. The rash may be red or purple and then turn into blisters or peeling of the skin. You may also notice a red rash with swelling of the face, lips, or lymph nodes in your neck or under your arms. Watch for new or worsening thoughts of suicide or depression. This includes sudden changes in mood, behaviors, or thoughts. These changes can happen at any time but are more common in the beginning of treatment or after a change in dose. Call your care team right away if you experience these thoughts or worsening depression. This medication may cause mood and behavior changes, such as anxiety, nervousness, irritability, hostility, restlessness, excitability, hyperactivity, or trouble sleeping. These changes can happen at any time but are more common in the beginning of treatment or after a change in dose. Call your care team right away if you notice any of these symptoms. This medication may affect your coordination, reaction time, or judgment. Do not drive or operate machinery until you know how this medication affects you. Sit up or stand slowly to reduce the risk of dizzy or fainting spells. Drinking alcohol with this medication can increase the risk of these side effects. This medication may increase blood sugar. The risk may be higher in patients who already have diabetes. Ask your care team what you can do to lower your risk of diabetes while taking this medication. This medication can cause an increase in blood pressure. This medication can also cause a sudden drop in your  blood pressure, which may make you feel faint and increase the chance of a fall. These effects are most common when you first start the medication or when the dose is increased, or during use of other medications that can cause a sudden drop in blood pressure. Check with your care team for instructions on monitoring your blood pressure while taking this medication. Your mouth may get dry. Chewing sugarless gum or sucking hard candy and drinking plenty of water may help. Contact your care team if the problem does not go away or is severe. What side effects may I notice from receiving this medication? Side effects that you should report to your care team as soon as possible: Allergic reactions--skin rash, itching, hives, swelling of the face, lips, tongue, or throat Bleeding--bloody or black, tar-like stools, red or dark brown urine, vomiting blood or brown material that looks like coffee grounds, small, red or purple spots on skin, unusual bleeding or bruising Increase in blood pressure Liver injury--right upper belly pain, loss of appetite, nausea, light-colored stool, dark yellow or brown urine, yellowing skin or eyes, unusual weakness or fatigue Low sodium level--muscle weakness, fatigue, dizziness, headache, confusion Redness, blistering, peeling, or loosening of the skin, including inside the mouth Serotonin syndrome--irritability, confusion, fast or irregular heartbeat, muscle stiffness, twitching muscles, sweating, high fever, seizures, chills, vomiting, diarrhea Sudden eye pain or change in vision such as blurry vision, seeing halos around lights, vision loss Thoughts of suicide or self-harm, worsening mood, feelings of depression Trouble passing urine Side effects that  usually do not require medical attention (report to your care team if they continue or are bothersome): Change in sex drive or performance Constipation Diarrhea Dizziness Dry mouth Excessive sweating Loss of  appetite Nausea Vomiting This list may not describe all possible side effects. Call your doctor for medical advice about side effects. You may report side effects to FDA at 1-800-FDA-1088. Where should I keep my medication? Keep out of the reach of children and pets. Store at room temperature between 15 and 30 degrees C (59 to 86 degrees F). Get rid of any unused medication after the expiration date. To get rid of medications that are no longer needed or have expired: Take the medication to a medication take-back program. Check with your pharmacy or law enforcement to find a location. If you cannot return the medication, check the label or package insert to see if the medication should be thrown out in the garbage or flushed down the toilet. If you are not sure, ask your care team. If it is safe to put it in the trash, take the medication out of the container. Mix the medication with cat litter, dirt, coffee grounds, or other unwanted substance. Seal the mixture in a bag or container. Put it in the trash. NOTE: This sheet is a summary. It may not cover all possible information. If you have questions about this medicine, talk to your doctor, pharmacist, or health care provider.  2024 Elsevier/Gold Standard (2021-11-23 00:00:00)

## 2022-12-25 NOTE — Progress Notes (Signed)
MEDICARE ANNUAL WELLNESS VISIT AND FOLLOW UP Assessment:   Jason Patton was seen today for medicare wellness and follow-up.  Diagnoses and all orders for this visit:  Annual Medicare Wellness Visit Due annually  Health maintenance reviewed Healthily lifestyle goals set   Essential hypertension Discussed DASH (Dietary Approaches to Stop Hypertension) DASH diet is lower in sodium than a typical American diet. Cut back on foods that are high in saturated fat, cholesterol, and trans fats. Eat more whole-grain foods, fish, poultry, and nuts Remain active and exercise as tolerated daily.  Monitor BP at home-Call if greater than 130/80.  Check CMP/CBC  Gastroesophageal reflux disease, unspecified whether esophagitis present No suspected reflux complications (Barret/stricture). Lifestyle modification:  wt loss, avoid meals 2-3h before bedtime. Consider eliminating food triggers:  chocolate, caffeine, EtOH, acid/spicy food.   Prostate cancer [facility]) S/p radiation, urology following   BPH with obstruction/lower urinary tract symptoms Monitor; urology following  CKD Stage 2 Discussed how what you eat and drink can aide in kidney protection. Stay well hydrated. Avoid high salt foods. Avoid NSAIDS. Keep BP and BG well controlled.   Take medications as prescribed. Remain active and exercise as tolerated daily. Maintain weight.  Continue to monitor. Check CMP/GFR/Microablumin  Vitamin D deficiency At goal at recent check; continue to recommend supplementation for goal of 60-100 Defer vitamin D level  Other abnormal glucose (prediabetes) Education: Reviewed 'ABCs' of diabetes management  Discussed goals to be met and/or maintained include A1C (<7) Blood pressure (<130/80) Cholesterol (LDL <70) Continue Eye Exam yearly  Continue Dental Exam Q6 mo Discussed dietary recommendations Discussed Physical Activity recommendations Check A1C  Morbid obesity (BMI 35+ with htn,  hyperlipidemia, prediabetes)  Education: Reviewed 'ABCs' of diabetes management  Discussed goals to be met and/or maintained include A1C (<7) Blood pressure (<130/80) Cholesterol (LDL <70) Continue Eye Exam yearly  Continue Dental Exam Q6 mo Discussed dietary recommendations Discussed Physical Activity recommendations Foot exam UTD Check A1C   Hyperlipidemia, mixed Discussed lifestyle modifications. Recommended diet heavy in fruits and veggies, omega 3's. Decrease consumption of animal meats, cheeses, and dairy products. Remain active and exercise as tolerated. Continue to monitor. Check lipids/TSH   Chronic left hip pain S/p left hip replacement Celebrex and Meloxicam no longer effective Start Cymbalta 30 mg HS Continue to monitor  Need for flu vaccine Administered - patient tolerated well.  Defer blood work today - assessed Q6 mo- stable - Obtain NOV 02/2023  Notify office for further evaluation and treatment, questions or concerns if any reported s/s fail to improve.   The patient was advised to call back or seek an in-person evaluation if any symptoms worsen or if the condition fails to improve as anticipated.   Further disposition pending results of labs. Discussed med's effects and SE's.    I discussed the assessment and treatment plan with the patient. The patient was provided an opportunity to ask questions and all were answered. The patient agreed with the plan and demonstrated an understanding of the instructions.  Discussed med's effects and SE's. Screening labs and tests as requested with regular follow-up as recommended.  I provided 30 minutes of face-to-face time during this encounter including counseling, chart review, and critical decision making was preformed.  Today's Plan of Care is based on a patient-centered health care approach known as shared decision making - the decisions, tests and treatments allow for patient preferences and values to be  balanced with clinical evidence.     Future Appointments  Date Time Provider  Department Center  02/07/2023  3:30 PM Lucky Cowboy, MD [facility] None  05/14/2023  3:30 PM Adela Glimpse, NP [facility] None  08/19/2023  3:00 PM Lucky Cowboy, MD [facility] None  12/25/2023  3:00 PM Adela Glimpse, NP [facility] None     Plan:   During the course of the visit the patient was educated and counseled about appropriate screening and preventive services including:   Pneumococcal vaccine  Influenza vaccine Prevnar 13 Td vaccine Screening electrocardiogram Colorectal cancer screening Diabetes screening Glaucoma screening Nutrition counseling    Subjective:  Jason Patton. is a 72 y.o. male who presents for Medicare Annual Wellness Visit and 3 month follow up for HTN, hyperlipidemia, prediabetes, obesity and vitamin D Def.   Overall he reports feeling well today.  He has no new concerns at this time.   He reports several months of intermittent lateral L hip pain, with rolling over, with flexion, some discomfort with walking. Has been working with chiropractor without resolution. Has taken aleve 3 tabs in AM last few days with some benefit. Was started on Meloxicam and Celebrex after without effectiveness.  Reports caused him to have hallucinations.  He continues to have pain in his hip.  BMI is Body mass index is 38.02 kg/m., he has not been working on diet. Wt Readings from Last 3 Encounters:  12/25/22 272 lb 9.6 oz (123.7 kg)  10/31/22 272 lb 9.6 oz (123.7 kg)  07/31/22 269 lb 12.8 oz (122.4 kg)    His blood pressure has been controlled at home has been running a little bit higher with his hip pain, he takes is taking his bp med at 5 AM, today their BP is BP: 120/82  BP Readings from Last 3 Encounters:  12/25/22 120/82  10/31/22 138/82  07/31/22 138/84  He does not workout. He denies chest pain, shortness of breath, dizziness.    He is not on cholesterol medication  and denies myalgias. His cholesterol is at goal. The cholesterol last visit was:   Lab Results  Component Value Date   CHOL 189 07/31/2022   HDL 59 07/31/2022   LDLCALC 109 (H) 07/31/2022   TRIG 104 07/31/2022   CHOLHDL 3.2 07/31/2022   He has not been working on diet and exercise for prediabetes, and denies increased appetite, nausea, paresthesia of the feet, polydipsia, polyuria and visual disturbances. Last A1C in the office was:  Lab Results  Component Value Date   HGBA1C 6.3 (H) 07/31/2022   He has stable CKD stage 2 range. Drinking more water Last GFR Lab Results  Component Value Date   EGFR 70 07/31/2022     Patient is on Vitamin D supplement.   Lab Results  Component Value Date   VD25OH 21 07/31/2022     Patient was diagnosed with Prostate Cancer in 2018 and was treated by 40 Radiation sessions July 5 thru Aug 29 and was followed by Dr. Marlou Porch however hasn't followed up since 05/2019 per notes in media.   Lab Results  Component Value Date   PSA 0.64 12/21/2021   PSA 0.37 12/21/2020   PSA 0.2 06/04/2019     Medication Review:   Current Outpatient Medications (Cardiovascular):    bisoprolol-hydrochlorothiazide (ZIAC) 10-6.25 MG tablet, TAKE 1 TABLET BY MOUTH DAILY FOR BLOOD PRESSURE   losartan (COZAAR) 50 MG tablet, TAKE 1 TABLET BY MOUTH DAILY FOR BLOOD PRESSURE   Current Outpatient Medications (Analgesics):    aspirin 81 MG tablet, Take 162 mg by mouth daily.  celecoxib (CELEBREX) 100 MG capsule, Take 1 capsule (100 mg total) by mouth 2 (two) times daily. (Patient not taking: Reported on 12/25/2022)  Current Outpatient Medications (Hematological):    vitamin B-12 (CYANOCOBALAMIN) 50 MCG tablet, Take 50 mcg by mouth. Takes 1 tablet every other day.  Current Outpatient Medications (Other):    Cholecalciferol (VITAMIN D3) 5000 UNITS CAPS, Take 5,000 Units by mouth daily.   DULoxetine (CYMBALTA) 30 MG capsule, Take 1 capsule (30 mg total) by mouth daily.    omeprazole (PRILOSEC) 40 MG capsule, Take  1 capsule  2 x /day -  am & pm  to Prevent Heartburn & Indigestion                                       /           TAKE                    BY                       MOUTH   terbinafine (LAMISIL) 250 MG tablet, TAKE 1 TABLET DAILY FOR 1 MONTH. ALTERNATING 1 MONTH ON AND 1 MONTH OFF.   gabapentin (NEURONTIN) 300 MG capsule, Take  1 capsule  3 to 4 x /day  as needed for Pain (Patient not taking: Reported on 05/22/2021)  Allergies: Allergies  Allergen Reactions   Ace Inhibitors Other (See Comments)    Cough    Citalopram Nausea And Vomiting    Current Problems (verified) has Essential hypertension; Hyperlipidemia, mixed; Vitamin D deficiency; Abnormal glucose; CKD (chronic kidney disease) stage 3, GFR 30-59 ml/min (HCC); Morbid obesity (BMI 35+ with htn, hyperlipidemia, prediabetes) ; Prostate cancer (HCC); Gastroesophageal reflux disease; FHx: heart disease; BPH with obstruction/lower urinary tract symptoms; Bursitis of left hip; and Left hip pain on their problem list.  Screening Tests Health Maintenance  Topic Date Due   Zoster Vaccines- Shingrix (1 of 2) Never done   Fecal DNA (Cologuard)  12/22/2016   DTaP/Tdap/Td (2 - Td or Tdap) 12/02/2018   COVID-19 Vaccine (3 - Moderna risk series) 05/27/2019   Medicare Annual Wellness (AWV)  12/25/2023   Pneumonia Vaccine 63+ Years old  Completed   INFLUENZA VACCINE  Completed   Hepatitis C Screening  Completed   HPV VACCINES  Aged Out    Immunization History  Administered Date(s) Administered   Influenza Split 12/23/2013   Influenza, High Dose Seasonal PF 11/25/2014, 03/20/2017, 02/04/2019, 12/14/2019, 12/21/2020, 12/21/2021, 12/25/2022   Influenza, Seasonal, Injecte, Preservative Fre 01/17/2016   Moderna Sars-Covid-2 Vaccination 04/01/2019, 04/29/2019   PNEUMOCOCCAL CONJUGATE-20 12/21/2021   Pneumococcal Polysaccharide-23 12/01/2008, 09/10/2019   Tdap 12/01/2008    Cologuard:  2014 Negative  Declines further - declines colonoscopy   Names of Other Physician/Practitioners you currently use: 1. [facility] here for primary care 2. - , eye doctor, last visit remote  3. Dr. Marijo Conception, dentist, last visit, 12/05/2021  Patient Care Team: Lucky Cowboy, MD as PCP - General (Internal Medicine) Crist Fat, MD as Attending Physician (Urology)  Surgical: He  has a past surgical history that includes Hemorrhoid surgery; Tonsillectomy; and Cystoscopy/retrograde/ureteroscopy (Left, 01/02/2013). Family His family history includes Cancer in his mother; Diabetes in his father; Heart disease in his father. Social history  He reports that he has never smoked. He has never used  smokeless tobacco. He reports that he does not drink alcohol and does not use drugs.  MEDICARE WELLNESS OBJECTIVES: Physical activity: Current Exercise Habits: Home exercise routine Cardiac risk factors:   Depression/mood screen:      12/25/2022    4:57 PM  Depression screen PHQ 2/9  Decreased Interest 0  Down, Depressed, Hopeless 0  PHQ - 2 Score 0    ADLs:     12/25/2022    4:57 PM 07/30/2022    9:23 PM  In your present state of health, do you have any difficulty performing the following activities:  Hearing? 0 0  Vision? 0 0  Difficulty concentrating or making decisions? 0 0  Walking or climbing stairs? 1 0  Dressing or bathing? 0 0  Doing errands, shopping? 0 0     Cognitive Testing  Alert? Yes  Normal Appearance?Yes  Oriented to person? Yes  Place? Yes   Time? Yes  Recall of three objects?  Yes  Can perform simple calculations? Yes  Displays appropriate judgment?Yes  Can read the correct time from a watch face?Yes  EOL planning:     Objective:   Today's Vitals   12/25/22 1507  BP: 120/82  Pulse: 85  Temp: 97.7 F (36.5 C)  SpO2: 96%  Weight: 272 lb 9.6 oz (123.7 kg)  Height: 5\' 11"  (1.803 m)     Body mass index is 38.02  kg/m.  General appearance: alert, no distress, WD/WN, male HEENT: normocephalic, sclerae anicteric, TMs pearly, nares patent, no discharge or erythema, pharynx normal Oral cavity: MMM, no lesions Neck: supple, no lymphadenopathy, no thyromegaly, no masses Heart: RRR, normal S1, S2, no murmurs Lungs: CTA bilaterally, no wheezes, rhonchi, or rales Abdomen: +bs, soft, obese, non tender, non distended, no masses, no hepatomegaly, no splenomegaly Musculoskeletal: no swelling, no obvious deformity; left hip with reduced flexion, internal rotation, external rotation. Lateral tenderness over greater trochanter.  Extremities: no edema, no cyanosis, no clubbing Pulses: 2+ symmetric, upper and lower extremities, normal cap refill Neurological: alert, oriented x 3, CN2-12 intact, strength normal upper extremities and lower extremities, sensation normal throughout, DTRs 2+ throughout, no cerebellar signs, gait mildly antalgic Psychiatric: normal affect, behavior normal, pleasant Skin: warm/dry, intact, without concerning rashes, ecchymosis; bil toe nails with normal bases, thick/yellow and brittle distally   Medicare Attestation I have personally reviewed: The patient's medical and social history Their use of alcohol, tobacco or illicit drugs Their current medications and supplements The patient's functional ability including ADLs,fall risks, home safety risks, cognitive, and hearing and visual impairment Diet and physical activities Evidence for depression or mood disorders  The patient's weight, height, BMI, and visual acuity have been recorded in the chart.  I have made referrals, counseling, and provided education to the patient based on review of the above and I have provided the patient with a written personalized care plan for preventive services.     Adela Glimpse, NP   12/25/2022

## 2023-02-07 ENCOUNTER — Ambulatory Visit (INDEPENDENT_AMBULATORY_CARE_PROVIDER_SITE_OTHER): Payer: Medicare HMO | Admitting: Internal Medicine

## 2023-02-07 ENCOUNTER — Encounter: Payer: Self-pay | Admitting: Internal Medicine

## 2023-02-07 VITALS — BP 130/80 | HR 60 | Temp 97.8°F | Ht 71.0 in | Wt 269.6 lb

## 2023-02-07 DIAGNOSIS — E782 Mixed hyperlipidemia: Secondary | ICD-10-CM

## 2023-02-07 DIAGNOSIS — E559 Vitamin D deficiency, unspecified: Secondary | ICD-10-CM

## 2023-02-07 DIAGNOSIS — I10 Essential (primary) hypertension: Secondary | ICD-10-CM | POA: Diagnosis not present

## 2023-02-07 DIAGNOSIS — R7309 Other abnormal glucose: Secondary | ICD-10-CM | POA: Diagnosis not present

## 2023-02-07 DIAGNOSIS — Z79899 Other long term (current) drug therapy: Secondary | ICD-10-CM

## 2023-02-07 NOTE — Patient Instructions (Signed)

## 2023-02-07 NOTE — Progress Notes (Signed)
ADULT   &   ADOLESCENT      INTERNAL MEDICINE  Jason Patton, M.D.          Rance Muir, ANP        Adela Glimpse, FNP  [facility] [address]  [place], [place]. [address] Telephone [phone] Telefax [phone]   Future Appointments  Date Time Provider Department  02/07/2023                       6 mo   3:30 PM Jason Cowboy, MD [facility]  05/14/2023                       9 mo  3:30 PM Adela Glimpse, NP [facility]  08/19/2023                       cpe  3:00 PM Jason Cowboy, MD [facility]  12/25/2023  3:00 PM Adela Glimpse, NP [facility]    History of Present Illness:       This very nice 72 y.o.MWM  presents for 6 month follow up with HTN, HLD, Pre-Diabetes and Vitamin D Deficiency.         Patient is treated for HTN  since  & BP has been controlled at home. Today's BP is at goal -  130/80. Patient has had no complaints of any cardiac type chest pain, palpitations, dyspnea [name] /PND, dizziness, claudication  or dependent edema.        Hyperlipidemia is controlled with diet & meds. Patient denies myalgias or other med SE's. Last Lipids were  Lab Results  Component Value Date   CHOL 189 07/31/2022   HDL 59 07/31/2022   LDLCALC 109 (H) 07/31/2022   TRIG 104 07/31/2022   CHOLHDL 3.2 07/31/2022      Also, the patient has history of PreDiabetes and has had no symptoms of reactive hypoglycemia, diabetic polys, paresthesias or visual blurring.  Last A1c was   Lab Results  Component Value Date   HGBA1C 6.3 (H) 07/31/2022         Further, the patient also has history of Vitamin D Deficiency and supplements vitamin D . Last vitamin D was  Lab Results  Component Value Date   VD25OH 75 07/31/2022      Current Outpatient Medications on File Prior to Visit  Medication Sig   aspirin 81 MG tablet Take 162 mg  daily.    bisoprolol-hctz 10-6.25 MG tablet TAKE 1 TABLET DAILY    VITAMIN D 5000 u  Take 5,000 Units  daily.   losartan (COZAAR) 50 MG tablet TAKE 1 TABLET  DAILY    vitamin B-12 50 MCG tablet Takes 1 tablet every other day.     Allergies  Allergen Reactions   Ace Inhibitors Other (See Comments)    Cough    Citalopram Nausea And Vomiting     PMHx:   Past Medical History:  Diagnosis Date   GERD (gastroesophageal reflux disease)    Hemorrhoids    History of Helicobacter pylori infection    HTN (hypertension)    Hyperlipemia    Obesity    Peripheral vascular disease (HCC) 9/14   DVT  left lower leg   Prostate cancer (HCC)    Vitamin D deficiency      Immunization History  Administered Date(s) Administered   Influenza Split 12/23/2013   Influenza, High Dose  Seasonal PF 11/25/2014, 03/20/2017, 02/04/2019, 12/14/2019, 12/21/2020, 12/21/2021, 12/25/2022   Influenza, Seasonal, Injecte, Preservative Fre 01/17/2016   Moderna Sars-Covid-2 Vaccination 04/01/2019, 04/29/2019   PNEUMOCOCCAL CONJUGATE-20 12/21/2021   Pneumococcal Polysaccharide-23 12/01/2008, 09/10/2019   Tdap 12/01/2008     Past Surgical History:  Procedure Laterality Date   CYSTOSCOPY/RETROGRADE/URETEROSCOPY Left 01/02/2013   Procedure: CYSTOSCOPY/LEFT URETERAL WASHINGS/LEFT RETROGRADE PYELOGRAM/ LEFT URETEROSCOPY/URETERAL BIOPSY. BLADDER BIOPSY;  Surgeon: Crist Fat, MD;  Location: [facility];  Service: Urology;  Laterality: Left;  With STENT   HEMORRHOID SURGERY     age 4   TONSILLECTOMY      FHx:    Reviewed / unchanged  SHx:    Reviewed / unchanged    Systems Review:  Constitutional: Denies fever, chills, wt changes, headaches, insomnia, fatigue, night sweats, change in appetite. Eyes: Denies redness, blurred vision, diplopia, discharge, itchy, watery eyes.  ENT: Denies discharge, congestion, post nasal drip, epistaxis, sore throat, earache, hearing loss, dental pain, tinnitus, vertigo, sinus pain, snoring.  CV: Denies chest pain, palpitations, irregular heartbeat, syncope,  dyspnea, diaphoresis, orthopnea, PND, claudication or edema. Respiratory: denies cough, dyspnea, DOE, pleurisy, hoarseness, laryngitis, wheezing.  Gastrointestinal: Denies dysphagia, odynophagia, heartburn, reflux, water brash, abdominal pain or cramps, nausea, vomiting, bloating, diarrhea, constipation, hematemesis, melena, hematochezia  or hemorrhoids. Genitourinary: Denies dysuria, frequency, urgency, nocturia, hesitancy, discharge, hematuria or flank pain. Musculoskeletal: Denies arthralgias, myalgias, stiffness, jt. swelling, pain, limping or strain/sprain.  Skin: Denies pruritus, rash, hives, warts, acne, eczema or change in skin lesion(s). Neuro: No weakness, tremor, incoordination, spasms, paresthesia or pain. Psychiatric: Denies confusion, memory loss or sensory loss. Endo: Denies change in weight, skin or hair change.  Heme/Lymph: No excessive bleeding, bruising or enlarged lymph nodes.   Physical Exam  BP 130/80   Pulse 60   Temp 97.8 F (36.6 C)   Ht 5\' 11"  (1.803 m)   Wt 269 lb 9.6 oz (122.3 kg)   SpO2 99%   BMI 37.60 kg/m   Appears  well nourished, well groomed  and in no distress.  Eyes: PERRLA, EOMs, conjunctiva no swelling or erythema. Sinuses: No frontal/maxillary tenderness ENT/Mouth: EAC's clear, TM's nl w/o erythema, bulging. Nares clear w/o erythema, swelling, exudates. Oropharynx clear without erythema or exudates. Oral hygiene is good. Tongue normal, non obstructing. Hearing intact.  Neck: Supple. Thyroid not palpable. Car 2+/2+ without bruits, nodes or JVD. Chest: Respirations nl with BS clear & equal w/o rales, rhonchi, wheezing or stridor.  Cor: Heart sounds normal w/ regular rate and rhythm without sig. murmurs, gallops, clicks or rubs. Peripheral pulses normal and equal  without edema.  Abdomen: Soft & bowel sounds normal. Non-tender w/o guarding, rebound, hernias, masses or organomegaly.  Lymphatics: Unremarkable.  Musculoskeletal: Full ROM all  peripheral extremities, joint stability, 5/5 strength and normal gait.  Skin: Warm, dry without exposed rashes, lesions or ecchymosis apparent.  Neuro: Cranial nerves intact, reflexes equal bilaterally. Sensory-motor testing grossly intact. Tendon reflexes grossly intact.  Pysch: Alert & oriented x 3.  Insight and judgement nl & appropriate. No ideations.   Assessment and Plan:   1. Essential hypertension  - Continue medication, monitor blood pressure at home.  - Continue DASH diet.  Reminder to go to the ER if any CP,  SOB, nausea, dizziness, severe HA, changes vision/speech.   - CBC with Differential/Platelet - COMPLETE METABOLIC PANEL WITH GFR - Magnesium - TSH   2. Hyperlipidemia, mixed  - Continue diet/meds, exercise,& lifestyle modifications.  - Continue monitor periodic cholesterol/liver & renal functions    -  Lipid panel - TSH   3. Abnormal glucose  - Continue diet, exercise  - Lifestyle modifications.  - Monitor appropriate labs   - Hemoglobin A1c - Insulin, random   4. Vitamin D deficiency  - Continue supplementation   - VITAMIN D 25 Hydroxy    5. Medication management  - CBC with Differential/Platelet - COMPLETE METABOLIC PANEL WITH GFR - Magnesium - Lipid panel - TSH - Hemoglobin A1c - Insulin, random - VITAMIN D 25 Hydroxy         Discussed  regular exercise, BP monitoring, weight control to achieve/maintain BMI less than 25 and discussed med and SE's. Recommended labs to assess /monitor clinical status .  I discussed the assessment and treatment plan with the patient. The patient was provided an opportunity to ask questions and all were answered. The patient agreed with the plan and demonstrated an understanding of the instructions.  I provided over 30 minutes of exam, counseling, chart review and  complex critical decision making.        The patient was advised to call back or seek an in-person evaluation if the symptoms worsen or if the  condition fails to improve as anticipated.   Marinus Maw, MD

## 2023-02-08 LAB — COMPLETE METABOLIC PANEL WITH GFR
AG Ratio: 1.6 (calc) (ref 1.0–2.5)
ALT: 19 U/L (ref 9–46)
AST: 18 U/L (ref 10–35)
Albumin: 4.4 g/dL (ref 3.6–5.1)
Alkaline phosphatase (APISO): 75 U/L (ref 35–144)
BUN: 16 mg/dL (ref 7–25)
CO2: 33 mmol/L — ABNORMAL HIGH (ref 20–32)
Calcium: 9.9 mg/dL (ref 8.6–10.3)
Chloride: 99 mmol/L (ref 98–110)
Creat: 1.05 mg/dL (ref 0.70–1.28)
Globulin: 2.7 g/dL (ref 1.9–3.7)
Glucose, Bld: 93 mg/dL (ref 65–99)
Potassium: 4.3 mmol/L (ref 3.5–5.3)
Sodium: 141 mmol/L (ref 135–146)
Total Bilirubin: 0.4 mg/dL (ref 0.2–1.2)
Total Protein: 7.1 g/dL (ref 6.1–8.1)
eGFR: 75 mL/min/{1.73_m2} (ref >=60)

## 2023-02-08 LAB — CBC WITH DIFFERENTIAL/PLATELET
Absolute Lymphocytes: 1894 {cells}/uL (ref 850–3900)
Absolute Monocytes: 893 {cells}/uL (ref 200–950)
Basophils Absolute: 54 {cells}/uL (ref 0–200)
Basophils Relative: 0.7 %
Eosinophils Absolute: 308 {cells}/uL (ref 15–500)
Eosinophils Relative: 4 %
HCT: 46.5 % (ref 38.5–50.0)
Hemoglobin: 15.6 g/dL (ref 13.2–17.1)
MCH: 30.2 pg (ref 27.0–33.0)
MCHC: 33.5 g/dL (ref 32.0–36.0)
MCV: 89.9 fL (ref 80.0–100.0)
MPV: 10 fL (ref 7.5–12.5)
Monocytes Relative: 11.6 %
Neutro Abs: 4551 {cells}/uL (ref 1500–7800)
Neutrophils Relative %: 59.1 %
Platelets: 240 10*3/uL (ref 140–400)
RBC: 5.17 10*6/uL (ref 4.20–5.80)
RDW: 12.4 % (ref 11.0–15.0)
Total Lymphocyte: 24.6 %
WBC: 7.7 10*3/uL (ref 3.8–10.8)

## 2023-02-08 LAB — INSULIN, RANDOM: Insulin: 15.3 u[IU]/mL

## 2023-02-08 LAB — HEMOGLOBIN A1C
Hgb A1c MFr Bld: 6.3 %{Hb} — ABNORMAL HIGH (ref <5.7)
Mean Plasma Glucose: 134 mg/dL
eAG (mmol/L): 7.4 mmol/L

## 2023-02-08 LAB — LIPID PANEL
Cholesterol: 184 mg/dL (ref <200)
HDL: 50 mg/dL (ref >=40)
LDL Cholesterol (Calc): 112 mg/dL — ABNORMAL HIGH
Non-HDL Cholesterol (Calc): 134 mg/dL — ABNORMAL HIGH (ref <130)
Total CHOL/HDL Ratio: 3.7 (calc) (ref <5.0)
Triglycerides: 116 mg/dL (ref <150)

## 2023-02-08 LAB — MAGNESIUM: Magnesium: 2.1 mg/dL (ref 1.5–2.5)

## 2023-02-08 LAB — VITAMIN D 25 HYDROXY (VIT D DEFICIENCY, FRACTURES): Vit D, 25-Hydroxy: 80 ng/mL (ref 30–100)

## 2023-02-08 LAB — TSH: TSH: 1.09 m[IU]/L (ref 0.40–4.50)

## 2023-02-09 ENCOUNTER — Other Ambulatory Visit: Payer: Self-pay | Admitting: Internal Medicine

## 2023-02-09 DIAGNOSIS — E782 Mixed hyperlipidemia: Secondary | ICD-10-CM

## 2023-02-09 MED ORDER — ROSUVASTATIN CALCIUM 20 MG PO TABS: ORAL_TABLET | ORAL | 3 refills | Status: DC

## 2023-02-09 NOTE — Progress Notes (Signed)
[][][][][][][][][][][][][][][][][][][][][][][][][][][][][][][][][][][][][][][][][]][][][][][][][][][][][][][][][][][][][][][][][[][][][][] [][][][][][][][][][][][][][][][][][][][][][][][][][][][][][][][][][][][][][][][][]][][][][][][][][][][][][][][][][][][][][][][][[][][][][] -  Test results slightly outside the reference range are not unusual. If there is anything important, I will review this with you,  otherwise it is considered normal test values.  If you have further questions,  please do not hesitate to contact me at the office or via My Chart.  [] [] [] [] [] [] [] [] [] [] [] [] [] [] [] [] [] [] [] [] [] [] [] [] [] [] [] [] [] [] [] [] [] [] [] [] [] [] [] [] [] ][] [] [] [] [] [] [] [] [] [] [] [] [] [] [] [] [] [] [] [] [] [] [[] [] [] [] []  [] [] [] [] [] [] [] [] [] [] [] [] [] [] [] [] [] [] [] [] [] [] [] [] [] [] [] [] [] [] [] [] [] [] [] [] [] [] [] [] [] ][] [] [] [] [] [] [] [] [] [] [] [] [] [] [] [] [] [] [] [] [] [] [[] [] [] [] []   -  Chol = 184 is good  But  - Bad Dangerous LDL  Chol = 112 is STILL too High !  - So Sending in [facility] for                          Rosuvastatin ( Crestor )  to Prevent Heart Attack, Stroke & Dementia  [] [] [] [] [] [] [] [] [] [] [] [] [] [] [] [] [] [] [] [] [] [] [] [] [] [] [] [] [] [] [] [] [] [] [] [] [] [] [] [] [] ][] [] [] [] [] [] [] [] [] [] [] [] [] [] [] [] [] [] [] [] [] [] [[] [] [] [] []   -  A1c = 6.3%  Blood sugar and A1c are  STILL  elevated in the borderline and  early or pre-diabetes range which has the same   300% increased risk for heart attack, stroke, cancer and   alzheimer- type vascular dementia as full blown diabetes.   But the good news is that diet, exercise with                                                weight loss can cure the early diabetes at this point.  [] [] [] [] [] [] [] [] [] [] [] [] [] [] [] [] [] [] [] [] [] [] [] [] [] [] [] [] [] [] [] [] [] [] [] [] [] [] [] [] [] ][] [] [] [] [] [] [] [] [] [] [] [] [] [] [] [] [] [] [] [] [] [] [[] [] [] [] []   -  Vit D = 80  Excellent  - Please keep dose Same  [] [] [] [] [] [] [] [] [] [] [] [] [] [] [] [] [] [] [] [] [] [] [] [] [] [] [] [] [] [] [] [] [] [] [] [] [] [] [] [] [] ][] [] [] [] [] [] [] [] [] [] [] [] [] [] [] [] [] [] [] [] [] [] [[] [] [] [] []   -   All Else - CBC - Kidneys - Electrolytes - Liver - Magnesium & Thyroid    - all  Normal / OK  [] [] [] [] [] [] [] [] [] [] [] [] [] [] [] [] [] [] [] [] [] [] [] [] [] [] [] [] [] [] [] [] [] [] [] [] [] [] [] [] [] ][] [] [] [] [] [] [] [] [] [] [] [] [] [] [] [] [] [] [] [] [] [] [[] [] [] [] []     .

## 2023-02-20 DIAGNOSIS — M1612 Unilateral primary osteoarthritis, left hip: Secondary | ICD-10-CM | POA: Diagnosis not present

## 2023-02-20 DIAGNOSIS — M1712 Unilateral primary osteoarthritis, left knee: Secondary | ICD-10-CM | POA: Diagnosis not present

## 2023-03-29 ENCOUNTER — Other Ambulatory Visit: Payer: Self-pay | Admitting: Internal Medicine

## 2023-03-29 DIAGNOSIS — B351 Tinea unguium: Secondary | ICD-10-CM

## 2023-04-11 ENCOUNTER — Telehealth: Payer: Self-pay | Admitting: Nurse Practitioner

## 2023-04-11 ENCOUNTER — Other Ambulatory Visit: Payer: Self-pay | Admitting: Nurse Practitioner

## 2023-04-11 MED ORDER — TERBINAFINE HCL 250 MG PO TABS: 250.0000 mg | ORAL_TABLET | Freq: Every day | ORAL | 0 refills | Status: AC

## 2023-04-11 NOTE — Telephone Encounter (Signed)
 Patient is requesting a refill on Terbenafine for recurrent toenail fungus. I explained to him that he may need his liver functions checked first. He states that he had those checked in December. I pulled them up in epic and they look normal to me. I told him I would send a request for a refill to you and let you take a look and decide. He uses [facility] for his pharmacy.

## 2023-05-14 ENCOUNTER — Ambulatory Visit: Payer: Medicare HMO | Admitting: Nurse Practitioner

## 2023-05-15 ENCOUNTER — Other Ambulatory Visit: Payer: Self-pay

## 2023-05-15 MED ORDER — LOSARTAN POTASSIUM 50 MG PO TABS: ORAL_TABLET | ORAL | 0 refills | Status: DC

## 2023-05-30 ENCOUNTER — Ambulatory Visit (INDEPENDENT_AMBULATORY_CARE_PROVIDER_SITE_OTHER): Payer: Medicare HMO | Admitting: Family Medicine

## 2023-05-30 ENCOUNTER — Encounter: Payer: Self-pay | Admitting: Family Medicine

## 2023-05-30 VITALS — BP 161/84 | HR 70 | Ht 71.0 in | Wt 270.4 lb

## 2023-05-30 DIAGNOSIS — I10 Essential (primary) hypertension: Secondary | ICD-10-CM

## 2023-05-30 DIAGNOSIS — Z7689 Persons encountering health services in other specified circumstances: Secondary | ICD-10-CM

## 2023-05-30 DIAGNOSIS — Z96642 Presence of left artificial hip joint: Secondary | ICD-10-CM | POA: Diagnosis not present

## 2023-05-30 DIAGNOSIS — E559 Vitamin D deficiency, unspecified: Secondary | ICD-10-CM | POA: Diagnosis not present

## 2023-05-30 DIAGNOSIS — E782 Mixed hyperlipidemia: Secondary | ICD-10-CM | POA: Diagnosis not present

## 2023-05-30 NOTE — Patient Instructions (Signed)
 It was nice to see you today,  We addressed the following topics today: -I will follow-up with you in 3 months.  A week prior to your next visit we can get your labs. - No changes to your medications today. - If you ever need me to do the steroid knee injection and you cannot get your orthopedist to do it I would be happy to do it for you.  Have a great day,  Frederic Jericho, MD

## 2023-05-30 NOTE — Assessment & Plan Note (Signed)
 Patient had left hip replaced last year due to arthritis.  Sees Dr. Turner Daniels for orthopedics.

## 2023-05-30 NOTE — Assessment & Plan Note (Signed)
 LDL mildly elevated but given his age his ASCVD risk score is elevated.  Was set to start Crestor with his previous PCP right before he switched care.  Would like to hold off until he tries to lose some weight and at our next visit can discuss further.  Will get lipid panel before next visit.

## 2023-05-30 NOTE — Assessment & Plan Note (Signed)
 Elevated today.  Recheck at next visit and if it is still elevated we will discuss changes to his medication.  Currently takes bisoprolol-HCTZ and losartan.

## 2023-05-30 NOTE — Assessment & Plan Note (Signed)
 Takes 5000 IU daily of vitamin D.  Previous doctor had goal of vitamin D level of 70 for him

## 2023-05-30 NOTE — Progress Notes (Signed)
 New Patient Office Visit  Subjective   Patient ID: Jason Patton., male    DOB: Mar 23, 1950  Age: 73 y.o. MRN: [id number]  CC:  Chief Complaint  Patient presents with   New Patient (Initial Visit)    HPI Jason Patton. presents to establish care Patient previously was a longtime patient of Dr. Norvel Richards.  He was being treated for hypertension, hyperlipidemia, GERD,.  He has a history of prostate cancer and received radiation therapy.  Currently in remission.  Patient provided a list of his medications.  He is taking omeprazole every other day and this is working well for him.  Takes terbinafine for onychomycosis month on month off.  Is not taking Crestor because he was about to start this right before Dr. Oneta Rack passed away.  Patient wants to hold off on starting this right now and would like to try and improve his diet before he sees me again.  Patient has recent hip replacement and also gets cortisone shots in his knee from his orthopedist.  PMH: htn, hld, prostate cancer, ckd,   PSH: L-THR- dr. Turner Daniels.    FH: Bladder cancer-mother; diabetes, heart disease-father  Tobacco use: no Alcohol use: no Drug use: no Marital status: married Employment: insulation and gutters.    Screenings:  Colon Cancer: Receives Cologuard.  Appears he is due. Lung Cancer: N/A.   Outpatient Encounter Medications as of 05/30/2023  Medication Sig   aspirin 81 MG tablet Take 162 mg by mouth daily.    bisoprolol-hydrochlorothiazide (ZIAC) 10-6.25 MG tablet TAKE 1 TABLET BY MOUTH DAILY FOR BLOOD PRESSURE   Cholecalciferol (VITAMIN D3) 5000 UNITS CAPS Take 5,000 Units by mouth daily.   losartan (COZAAR) 50 MG tablet TAKE 1 TABLET BY MOUTH DAILY FOR BLOOD PRESSURE   rosuvastatin (CRESTOR) 20 MG tablet Take 1 tablet Daily for Cholesterol                                  /                                                                   TAKE                                         BY                                                  MOUTH ,   terbinafine (LAMISIL) 250 MG tablet Take 1 tablet (250 mg total) by mouth daily. Take 1 pill daily for one month, off for 1 month, one a month, etc until gone   vitamin B-12 (CYANOCOBALAMIN) 50 MCG tablet Take 50 mcg by mouth. Takes 1 tablet every other day.   No facility-administered encounter medications on file as of 05/30/2023.    Past Medical History:  Diagnosis Date   GERD (gastroesophageal reflux disease)    Hemorrhoids    History of Helicobacter  pylori infection    HTN (hypertension)    Hyperlipemia    Obesity    Peripheral vascular disease (HCC) 9/14   DVT  left lower leg   Prostate cancer (HCC)    Vitamin D deficiency     Past Surgical History:  Procedure Laterality Date   CYSTOSCOPY/RETROGRADE/URETEROSCOPY Left 01/02/2013   Procedure: CYSTOSCOPY/LEFT URETERAL WASHINGS/LEFT RETROGRADE PYELOGRAM/ LEFT URETEROSCOPY/URETERAL BIOPSY. BLADDER BIOPSY;  Surgeon: Crist Fat, MD;  Location: [facility];  Service: Urology;  Laterality: Left;  With STENT   HEMORRHOID SURGERY     age 6   TONSILLECTOMY      Family History  Problem Relation Age of Onset   Diabetes Father    Heart disease Father    Cancer Mother        Bladder    Social History   Socioeconomic History   Marital status: Married    Spouse name: Not on file   Number of children: 1   Years of education: Not on file   Highest education level: Not on file  Occupational History   Occupation: Development worker, [employer]    Employer: [employer]  Tobacco Use   Smoking status: Never   Smokeless tobacco: Never  Substance and Sexual Activity   Alcohol use: No   Drug use: No   Sexual activity: Yes  Other Topics Concern   Not on file  Social History Narrative   Not on file   Social Drivers of Health   Financial Resource Strain: Not on file  Food Insecurity: Not on file  Transportation Needs: Not on file  Physical Activity: Not on file  Stress: Not on file   Social Connections: Not on file  Intimate Partner Violence: Not on file    ROS     Objective   BP (!) 161/84   Pulse 70   Ht 5\' 11"  (1.803 m)   Wt 270 lb 6.4 oz (122.7 kg)   SpO2 97%   BMI 37.71 kg/m   Physical Exam General: Alert, oriented HEENT: PERRLA, EOMI, moist mucosa CV: Regular rate rhythm no murmurs Pulmonary: Lungs clear bilaterally GI: Soft, nontender.  Normal bowel sounds MSK: Strength equal bilaterally Skin: Warm and dry Psych: Pleasant affect     Assessment & Plan:   Encounter to establish care  Hyperlipidemia, mixed Assessment & Plan: LDL mildly elevated but given his age his ASCVD risk score is elevated.  Was set to start Crestor with his previous PCP right before he switched care.  Would like to hold off until he tries to lose some weight and at our next visit can discuss further.  Will get lipid panel before next visit.   Essential hypertension Assessment & Plan: Elevated today.  Recheck at next visit and if it is still elevated we will discuss changes to his medication.  Currently takes bisoprolol-HCTZ and losartan.   History of left hip replacement Assessment & Plan: Patient had left hip replaced last year due to arthritis.  Sees Dr. Turner Daniels for orthopedics.   Vitamin D deficiency Assessment & Plan: Takes 5000 IU daily of vitamin D.  Previous doctor had goal of vitamin D level of 70 for him     Return in about 3 months (around 08/30/2023) for hld.   Sandre Kitty, MD

## 2023-06-21 ENCOUNTER — Other Ambulatory Visit: Payer: Self-pay | Admitting: Family Medicine

## 2023-08-16 ENCOUNTER — Other Ambulatory Visit: Payer: Self-pay | Admitting: Family

## 2023-08-19 ENCOUNTER — Encounter: Payer: Medicare HMO | Admitting: Internal Medicine

## 2023-08-20 ENCOUNTER — Other Ambulatory Visit: Payer: Self-pay | Admitting: *Deleted

## 2023-08-20 DIAGNOSIS — I10 Essential (primary) hypertension: Secondary | ICD-10-CM

## 2023-08-20 DIAGNOSIS — E559 Vitamin D deficiency, unspecified: Secondary | ICD-10-CM

## 2023-08-20 DIAGNOSIS — E782 Mixed hyperlipidemia: Secondary | ICD-10-CM

## 2023-08-21 ENCOUNTER — Other Ambulatory Visit: Payer: Self-pay | Admitting: Family Medicine

## 2023-08-21 MED ORDER — LOSARTAN POTASSIUM 50 MG PO TABS
ORAL_TABLET | ORAL | 0 refills | Status: DC
Start: 2023-08-21 — End: 2023-09-02

## 2023-08-21 NOTE — Telephone Encounter (Signed)
 Copied from CRM [id number]. Topic: Clinical - Prescription Issue >> Aug 21, 2023 12:26 PM Alpha Arts wrote: Reason for CRM: Patient still unable to receive losartan  (COZAAR ) 50 MG tablet and is currently out of medication   Callback #: [phone]  Preferred Pharmacy: [facility] - [place], [place] - [address] [place] [address] Phone: [phone] Fax: [phone] Hours: Not open 24 hours

## 2023-08-23 ENCOUNTER — Other Ambulatory Visit

## 2023-08-23 DIAGNOSIS — E782 Mixed hyperlipidemia: Secondary | ICD-10-CM

## 2023-08-23 DIAGNOSIS — I10 Essential (primary) hypertension: Secondary | ICD-10-CM

## 2023-08-23 DIAGNOSIS — E559 Vitamin D deficiency, unspecified: Secondary | ICD-10-CM

## 2023-08-24 LAB — VITAMIN D 25 HYDROXY (VIT D DEFICIENCY, FRACTURES): Vit D, 25-Hydroxy: 75.2 ng/mL (ref 30.0–100.0)

## 2023-08-24 LAB — LIPID PANEL
Chol/HDL Ratio: 4.1 ratio (ref 0.0–5.0)
Cholesterol, Total: 187 mg/dL (ref 100–199)
HDL: 46 mg/dL (ref >39)
LDL Chol Calc (NIH): 117 mg/dL — ABNORMAL HIGH (ref 0–99)
Triglycerides: 133 mg/dL (ref 0–149)
VLDL Cholesterol Cal: 24 mg/dL (ref 5–40)

## 2023-08-24 LAB — CBC WITH DIFFERENTIAL/PLATELET
Basophils Absolute: 0 10*3/uL (ref 0.0–0.2)
Basos: 1 %
EOS (ABSOLUTE): 0.3 10*3/uL (ref 0.0–0.4)
Eos: 6 %
Hematocrit: 46.8 % (ref 37.5–51.0)
Hemoglobin: 15.9 g/dL (ref 13.0–17.7)
Immature Grans (Abs): 0 10*3/uL (ref 0.0–0.1)
Immature Granulocytes: 0 %
Lymphocytes Absolute: 1.4 10*3/uL (ref 0.7–3.1)
Lymphs: 24 %
MCH: 31.9 pg (ref 26.6–33.0)
MCHC: 34 g/dL (ref 31.5–35.7)
MCV: 94 fL (ref 79–97)
Monocytes Absolute: 0.7 10*3/uL (ref 0.1–0.9)
Monocytes: 12 %
Neutrophils Absolute: 3.5 10*3/uL (ref 1.4–7.0)
Neutrophils: 57 %
Platelets: 200 10*3/uL (ref 150–450)
RBC: 4.99 x10E6/uL (ref 4.14–5.80)
RDW: 13 % (ref 11.6–15.4)
WBC: 6 10*3/uL (ref 3.4–10.8)

## 2023-08-24 LAB — COMPREHENSIVE METABOLIC PANEL WITH GFR
ALT: 21 IU/L (ref 0–44)
AST: 19 IU/L (ref 0–40)
Albumin: 4.3 g/dL (ref 3.8–4.8)
Alkaline Phosphatase: 79 IU/L (ref 44–121)
BUN/Creatinine Ratio: 19 (ref 10–24)
BUN: 20 mg/dL (ref 8–27)
Bilirubin Total: 0.4 mg/dL (ref 0.0–1.2)
CO2: 25 mmol/L (ref 20–29)
Calcium: 9.7 mg/dL (ref 8.6–10.2)
Chloride: 100 mmol/L (ref 96–106)
Creatinine, Ser: 1.08 mg/dL (ref 0.76–1.27)
Globulin, Total: 2.4 g/dL (ref 1.5–4.5)
Glucose: 121 mg/dL — ABNORMAL HIGH (ref 70–99)
Potassium: 4.2 mmol/L (ref 3.5–5.2)
Sodium: 139 mmol/L (ref 134–144)
Total Protein: 6.7 g/dL (ref 6.0–8.5)
eGFR: 72 mL/min/{1.73_m2} (ref >59)

## 2023-08-24 LAB — TSH: TSH: 1.26 u[IU]/mL (ref 0.450–4.500)

## 2023-08-24 LAB — HEMOGLOBIN A1C
Est. average glucose Bld gHb Est-mCnc: 128 mg/dL
Hgb A1c MFr Bld: 6.1 % — ABNORMAL HIGH (ref 4.8–5.6)

## 2023-09-01 ENCOUNTER — Ambulatory Visit: Payer: Self-pay | Admitting: Family Medicine

## 2023-09-02 ENCOUNTER — Encounter: Payer: Self-pay | Admitting: Family Medicine

## 2023-09-02 ENCOUNTER — Ambulatory Visit (INDEPENDENT_AMBULATORY_CARE_PROVIDER_SITE_OTHER): Admitting: Family Medicine

## 2023-09-02 VITALS — BP 165/89 | HR 93 | Ht 71.0 in

## 2023-09-02 DIAGNOSIS — J302 Other seasonal allergic rhinitis: Secondary | ICD-10-CM | POA: Diagnosis not present

## 2023-09-02 DIAGNOSIS — I10 Essential (primary) hypertension: Secondary | ICD-10-CM | POA: Diagnosis not present

## 2023-09-02 DIAGNOSIS — J309 Allergic rhinitis, unspecified: Secondary | ICD-10-CM | POA: Insufficient documentation

## 2023-09-02 MED ORDER — LOSARTAN POTASSIUM 50 MG PO TABS: 50.0000 mg | ORAL_TABLET | Freq: Two times a day (BID) | ORAL | 1 refills | Status: DC

## 2023-09-02 MED ORDER — LOSARTAN POTASSIUM 50 MG PO TABS: 50.0000 mg | ORAL_TABLET | Freq: Two times a day (BID) | ORAL | 1 refills | Status: AC

## 2023-09-02 NOTE — Patient Instructions (Addendum)
 It was nice to see you today,  We addressed the following topics today: - I have increased your losartan  dosing from once a day to twice a day.  Please check and record your blood pressure values and bring them back to us  after two weeks.  - For your allergy symptoms you can try using Flonase nasal spray and Pataday eyedrops daily for at least 2 weeks.  If this does not help your runny nose and itchy eyes please let us  know.  Have a great day,  Rolan Slain, MD

## 2023-09-02 NOTE — Assessment & Plan Note (Signed)
 Obesity with weight management challenges despite dietary modifications. Reports significant reduction in caloric beverages and portion control. Family history contributory. Limited exercise capacity due to mobility issues. Weight medications not covered by insurance for obesity indication. - Calorie tracking recommended using smartphone app or written log for one week - Target under 2000 calories daily - Replace higher calorie foods with lower calorie alternatives (green vegetables, blueberries) - Continue current dietary modifications

## 2023-09-02 NOTE — Assessment & Plan Note (Signed)
 Currently on triple therapy with morning dosing: bisoprolol , hydrochlorothiazide , losartan  - Modify losartan  to twice daily dosing at 50mg   - Home blood pressure monitoring with written log, record values for 2 weeks and return them to us .  - Return visit in 6 months unless blood pressure concerns arise

## 2023-09-02 NOTE — Progress Notes (Signed)
   Established Patient Office Visit  Subjective   Patient ID: Jason Flavell., male    DOB: 08/11/1950  Age: 73 y.o. 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 - Limited exercise capacity due to walking difficulties - Family history of obesity (father, grandmothers described as heavy set) - Quit alcohol in 1970s  - Experiences seasonal allergy symptoms: eye itching, burning, nasal congestion occurring approximately every 6 months  - Anxiety regarding medical appointments due to previous cancer diagnosis experience - Blood pressure varies with stress levels, Mondays particularly stressful at work  Medications: bisoprolol , Ziac , losartan  50mg  daily (morning), artificial sweetener in coffee, occasional lemonade for kidney stone prevention  PMH: pre-diabetes, kidney stones, hypertension, previous cancer diagnosis. PSH: not mentioned. FH: obesity (father, grandmothers). Social Hx: quit alcohol 1970s, physically demanding work, married  ROS: denies eating candy or desserts, reports good sleep quality, seasonal eye itching and burning, nasal congestion without sore throat or cold symptoms    The 10-year ASCVD risk score (Arnett DK, et al., 2019) is: 39%  Health Maintenance Due  Topic Date Due   Zoster Vaccines- Shingrix (1 of 2) Never done   Fecal DNA (Cologuard)  12/22/2016   DTaP/Tdap/Td (2 - Td or Tdap) 12/02/2018   COVID-19 Vaccine (3 - Moderna risk series) 05/27/2019      Objective:     BP (!) 165/89   Pulse 93   Ht 5' 11 (1.803 m)   SpO2 98%    BMI 37.71 kg/m    Physical Exam Gen: alert, oriented Pulm: no resp distress Psych: pleasant affect   No results found for any visits on 09/02/23.      Assessment & Plan:   Essential hypertension Assessment & Plan: Currently on triple therapy with morning dosing: bisoprolol , hydrochlorothiazide , losartan  - Modify losartan  to twice daily dosing at 50mg   - Home blood pressure monitoring with written log, record values for 2 weeks and return them to us .  - Return visit in 6 months unless blood pressure concerns arise   Seasonal allergic rhinitis, unspecified trigger Assessment & Plan: - Pataday eye drops once daily for itchy eyes - Flonase nasal spray daily - Artificial tears as needed - Continue both medications for minimum 2 weeks before assessing efficacy   Morbid obesity (BMI 35+ with htn, hyperlipidemia, prediabetes)  Assessment & Plan: Obesity with weight management challenges despite dietary modifications. Reports significant reduction in caloric beverages and portion control. Family history contributory. Limited exercise capacity due to mobility issues. Weight medications not covered by insurance for obesity indication. - Calorie tracking recommended using smartphone app or written log for one week - Target under 2000 calories daily - Replace higher calorie foods with lower calorie alternatives (green vegetables, blueberries) - Continue current dietary modifications   Other orders -     Losartan  Potassium; Take 1 tablet (50 mg total) by mouth in the morning and at bedtime.  Dispense: 180 tablet; Refill: 1     Return in about 6 months (around 03/10/2024) for HTN.    Jason MARLA Slain, MD

## 2023-09-02 NOTE — Assessment & Plan Note (Signed)
-   Pataday eye drops once daily for itchy eyes - Flonase nasal spray daily - Artificial tears as needed - Continue both medications for minimum 2 weeks before assessing efficacy

## 2023-09-18 ENCOUNTER — Other Ambulatory Visit: Payer: Self-pay | Admitting: Family Medicine

## 2023-12-25 ENCOUNTER — Ambulatory Visit: Payer: Medicare HMO | Admitting: Nurse Practitioner

## 2024-01-23 ENCOUNTER — Other Ambulatory Visit: Payer: Self-pay | Admitting: Family Medicine

## 2024-03-03 ENCOUNTER — Ambulatory Visit (INDEPENDENT_AMBULATORY_CARE_PROVIDER_SITE_OTHER): Admitting: Family Medicine

## 2024-03-03 ENCOUNTER — Encounter: Payer: Self-pay | Admitting: Family Medicine

## 2024-03-03 VITALS — BP 134/81 | HR 67 | Ht 71.0 in | Wt 260.8 lb

## 2024-03-03 DIAGNOSIS — M7989 Other specified soft tissue disorders: Secondary | ICD-10-CM | POA: Diagnosis not present

## 2024-03-03 DIAGNOSIS — E782 Mixed hyperlipidemia: Secondary | ICD-10-CM | POA: Diagnosis not present

## 2024-03-03 DIAGNOSIS — Z125 Encounter for screening for malignant neoplasm of prostate: Secondary | ICD-10-CM

## 2024-03-03 DIAGNOSIS — I10 Essential (primary) hypertension: Secondary | ICD-10-CM | POA: Diagnosis not present

## 2024-03-03 NOTE — Assessment & Plan Note (Signed)
 Weight loss of 10 pounds due to dietary changes. No current use of rosuvastatin . - Continue healthy eating habits, reducing sugary and processed foods. - Scheduled follow-up physical examination in June.

## 2024-03-03 NOTE — Patient Instructions (Signed)
 It was nice to see you today,  We addressed the following topics today: - I will get a d-dimer lab test to rule out a blood clot.  If you need a ultrasound I will talk to you about it after I get the results of the d dimer.  - continue your regular blood pressure medication  Have a great day,  Rolan Slain, MD

## 2024-03-03 NOTE — Assessment & Plan Note (Signed)
 Blood pressure well-controlled with current medications. Home readings around 130/80 mmHg. - Continue bisoprolol , hydrochlorothiazide , and losartan .

## 2024-03-03 NOTE — Assessment & Plan Note (Signed)
 Chronic venous insufficiency with history of deep vein thrombosis Swelling in right leg likely due to venous insufficiency. Compression stockings provide relief. No heart failure signs. - Ordered D-dimer test to rule out deep vein thrombosis. - If D-dimer elevated, order ultrasound for deep vein thrombosis. - Continue compression stockings. - Encouraged regular movement and breaks from prolonged sitting.

## 2024-03-03 NOTE — Progress Notes (Unsigned)
 "  Established Patient Office Visit  Subjective   Patient ID: Jason Falkner., male    DOB: 03/24/1950  Age: 73 y.o. MRN: [id number]  Chief Complaint  Patient presents with   Medical Management of Chronic Issues    Discussed the use of AI scribe software for clinical note transcription with the patient, who gave verbal consent to proceed.  History of Present Illness   Jason Oleksy. is a 74 year old male with a history of blood clots who presents with right leg swelling.  He has experienced swelling in his right leg over the past month, most noticeable from the knee down. The swelling is not painful and reduces in the morning after sleeping. He wears compression stockings to manage the swelling. He has a history of a blood clot in one of his legs, though he is uncertain which leg it was. He has been taking 81 mg of aspirin daily for the past eight to ten years. He received cortisone shots in his knee three to four weeks ago.  He is currently taking bisoprolol , hydrochlorothiazide , and losartan  for blood pressure management, with losartan  taken twice daily. He monitors his blood pressure at home, which typically ranges from 125/70 to 130/80 mmHg.  He mentions a recent weight loss of about ten pounds, attributed to dietary changes, including cutting back on sweet tea and processed foods. He does not consume a lot of sweets or sugary drinks.  He works as a museum/gallery exhibitions officer and takes breaks to walk around to alleviate the swelling.          The 10-year ASCVD risk score (Arnett DK, et al., 2019) is: 28.8%  Health Maintenance Due  Topic Date Due   Zoster Vaccines- Shingrix (1 of 2) Never done   Fecal DNA (Cologuard)  12/22/2016   DTaP/Tdap/Td (2 - Td or Tdap) 12/02/2018   COVID-19 Vaccine (3 - Moderna risk series) 05/27/2019   Medicare Annual Wellness (AWV)  12/25/2023      Objective:     BP 134/81   Pulse 67   Ht 5' 11 (1.803 m)   Wt 260 lb 12.8 oz (118.3 kg)   SpO2 99%    BMI 36.37 kg/m  {Vitals History (Optional):23777}  Physical Exam     Gen: alert, oriented Pulm: no respiratory distress Psych: pleasant affect Ext: right leg diffusely edematous with 1+ pitting edema.        No results found for any visits on 03/03/24.      Assessment & Plan:   Right leg swelling Assessment & Plan: Chronic venous insufficiency with history of deep vein thrombosis Swelling in right leg likely due to venous insufficiency. Compression stockings provide relief. No heart failure signs. - Ordered D-dimer test to rule out deep vein thrombosis. - If D-dimer elevated, order ultrasound for deep vein thrombosis. - Continue compression stockings. - Encouraged regular movement and breaks from prolonged sitting.  Orders: -     D-dimer, quantitative; Future  Essential hypertension Assessment & Plan: Blood pressure well-controlled with current medications. Home readings around 130/80 mmHg. - Continue bisoprolol , hydrochlorothiazide , and losartan .   Morbid obesity (BMI 35+ with htn, hyperlipidemia, prediabetes)  Assessment & Plan: Weight loss of 10 pounds due to dietary changes. No current use of rosuvastatin . - Continue healthy eating habits, reducing sugary and processed foods. - Scheduled follow-up physical examination in June.       Return in about 6 months (around 09/01/2024) for physical.    Toribio POUR  Chandra, MD  "

## 2024-03-04 ENCOUNTER — Other Ambulatory Visit

## 2024-03-04 DIAGNOSIS — M7989 Other specified soft tissue disorders: Secondary | ICD-10-CM

## 2024-03-05 LAB — D-DIMER, QUANTITATIVE: D-DIMER: 5.6 mg{FEU}/L — ABNORMAL HIGH (ref 0.00–0.49)

## 2024-03-06 ENCOUNTER — Ambulatory Visit: Payer: Self-pay

## 2024-03-06 ENCOUNTER — Ambulatory Visit: Payer: Self-pay | Admitting: Family Medicine

## 2024-03-06 DIAGNOSIS — M7989 Other specified soft tissue disorders: Secondary | ICD-10-CM

## 2024-03-06 DIAGNOSIS — R7989 Other specified abnormal findings of blood chemistry: Secondary | ICD-10-CM

## 2024-03-06 MED ORDER — RIVAROXABAN 15 MG PO TABS: 15.0000 mg | ORAL_TABLET | Freq: Two times a day (BID) | ORAL | 0 refills | Status: DC

## 2024-03-06 NOTE — Telephone Encounter (Signed)
 See documentation below  Message from Antwanette L sent at 03/06/2024 10:59 AM EST  Reason for Triage: Patient reports that [facility] is unable to refill his Rivaroxaban (Xarelto) 15 mg tablets because his insurance will not cover it. Without insurance, the cost would be $1,042.00. The pharmacy stated they will be contacting the office. Patient is asking whether the provider can offer an alternative medication. He can be reached at [phone]

## 2024-03-08 ENCOUNTER — Other Ambulatory Visit: Payer: Self-pay | Admitting: Family Medicine

## 2024-03-09 ENCOUNTER — Other Ambulatory Visit (HOSPITAL_COMMUNITY): Payer: Self-pay

## 2024-03-09 ENCOUNTER — Telehealth: Payer: Self-pay

## 2024-03-09 MED ORDER — APIXABAN (ELIQUIS) VTE STARTER PACK (10MG AND 5MG): ORAL_TABLET | ORAL | 0 refills | Status: DC

## 2024-03-09 NOTE — Telephone Encounter (Signed)
 Yes he can stop taking the aspirin when he takes the eliquis .

## 2024-03-09 NOTE — Telephone Encounter (Signed)
 Copied from CRM [id number]. Topic: Clinical - Prescription Issue >> Mar 09, 2024  8:59 AM Shasta L wrote: Reason for CRM: Patient is wanting a call back to clarify Rivaroxaban  (Xarelto ) 15 mg tablets issue. Please advise.

## 2024-03-09 NOTE — Telephone Encounter (Signed)
 I will send in eliquis  but it is also likely to have a high copay initially, that is just how medicare prescriptions work now.  I will forward this to our pharmacist who may be able to get him a one month supply for a lower cost, since we do not carry samples here.

## 2024-03-09 NOTE — Progress Notes (Signed)
 Called patient he is advised of the recommendation he also asking should he take his 81mg  asprin in conjunction with the Eliquis 

## 2024-03-09 NOTE — Telephone Encounter (Signed)
 Copied from CRM #[id number]. Topic: Clinical - Medication Question >> Mar 09, 2024  3:08 PM Alfonso ORN wrote: Reason for CRM: blood clot med f/u   already taking aspirin and prev another provider told to stop taking aspirin and wants to confirm

## 2024-03-09 NOTE — Telephone Encounter (Signed)
 Called pt and advised him to stop taking the aspirin when he starts eliquis .  Went over signs/sx of possible PE and to have someone take him  to the ED if he is having any of these (he is not currently).

## 2024-03-09 NOTE — Progress Notes (Unsigned)
" ° °  03/09/2024  Patient ID: Jason JINNY Keller Mickey., male   DOB: 11-04-1950, 74 y.o.   MRN: [id number]  Pharmacy Quality Measure Review  {Pharmacy Quality Options:29749}  Lang Sieve, PharmD, [facility] Clinical Pharmacist  [phone]  "

## 2024-03-09 NOTE — Progress Notes (Signed)
" ° °  03/09/2024  Patient ID: Dallas JINNY Keller Mickey., male   DOB: 02-03-1951, 74 y.o.   MRN: [id number]  Located 30 day free trial offer for eliquis . Spoke to pharmacy and applied trial card information - confirmed that it went through at no cost.    Lang Sieve, PharmD, BCGP Clinical Pharmacist  [phone]

## 2024-03-18 ENCOUNTER — Ambulatory Visit
Admission: RE | Admit: 2024-03-18 | Discharge: 2024-03-18 | Disposition: A | Discharge status: Home / Self Care | Source: Ambulatory Visit | Attending: Family Medicine | Admitting: Family Medicine

## 2024-03-18 DIAGNOSIS — R7989 Other specified abnormal findings of blood chemistry: Secondary | ICD-10-CM

## 2024-03-18 DIAGNOSIS — M7989 Other specified soft tissue disorders: Secondary | ICD-10-CM

## 2024-03-20 ENCOUNTER — Ambulatory Visit: Payer: Self-pay | Admitting: Family Medicine

## 2024-03-20 ENCOUNTER — Other Ambulatory Visit: Payer: Self-pay

## 2024-03-20 ENCOUNTER — Other Ambulatory Visit: Payer: Self-pay | Admitting: Family Medicine

## 2024-03-20 DIAGNOSIS — I824Y9 Acute embolism and thrombosis of unspecified deep veins of unspecified proximal lower extremity: Secondary | ICD-10-CM

## 2024-03-20 NOTE — Progress Notes (Signed)
" ° °  03/20/2024  Patient ID: Jason Patton., male   DOB: November 21, 1950, 74 y.o.   MRN: [id number]   Medication Assistance:  Spoke with patient regarding price of elqiuis   Below is the breakdown for his insurance coverage continuing eliquis  as is.  - Eliquis  - $500 drug deductible (would have to pay $248.71 first two months, $56.73 third month) then 54.72/month thereafter.  - Spoke with patient and he would be okay with paying the above, plans to purchase at pharmacy once through with starter pack that was provided through voucher program.  - will contact office/or pharmacy once renewal is due. Provided my contact for any questions   Future Appointments  Date Time Provider Department Center  08/25/2024 11:15 AM [facility]  09/01/2024  2:30 PM Chandra Toribio POUR, MD [facility]   Lang Sieve, PharmD, [facility] Clinical Pharmacist  [phone]

## 2024-03-20 NOTE — Progress Notes (Signed)
 Amb ref

## 2024-03-20 NOTE — Progress Notes (Signed)
 Called patient he is advised of his lab results and recommendation

## 2024-03-26 ENCOUNTER — Ambulatory Visit (INDEPENDENT_AMBULATORY_CARE_PROVIDER_SITE_OTHER): Admitting: Family Medicine

## 2024-03-26 ENCOUNTER — Encounter: Payer: Self-pay | Admitting: Family Medicine

## 2024-03-26 VITALS — BP 120/75 | HR 84 | Ht 71.0 in | Wt 260.0 lb

## 2024-03-26 DIAGNOSIS — I824Y9 Acute embolism and thrombosis of unspecified deep veins of unspecified proximal lower extremity: Secondary | ICD-10-CM | POA: Insufficient documentation

## 2024-03-26 NOTE — Assessment & Plan Note (Signed)
 Worsening overnight.  About 10-12 cm of discrepancy in mid thigh circumference.  Compliant w/ eliquis .  Spoke with pharmacist at [facility] clinic.  Recommended continued eliquis  and will try to see pt tomorrow in DVT clinic.  Previous dvt u/s showed extension into common femoral vein but imaging stopped short of being able to see the full extent.  Gave pt info on dvt clinic, advised him to call them if he does not hear by 4pm.  Referral sent.  Advised on signs/sx of PE and recommended ED evaluation if these sx occur.

## 2024-03-26 NOTE — Progress Notes (Unsigned)
 " DVT Clinic Note  Name: Jason Patton.     MRN: [id number]     DOB: November 05, 1950     Sex: male  PCP: Chandra Toribio POUR, MD  Today's Visit: Visit Information: Initial Visit  Referred to DVT Clinic by: Primary Care - Toribio Chandra, MD Referred to CPP by: Dr. Pearline Reason for referral:  Chief Complaint  Patient presents with   DVT   HISTORY OF PRESENT ILLNESS: Jason Patton. is a 74 y.o. male with PMH DVT in left femoral, popliteal, and calf veins in 2014 who presents after diagnosis of DVT for medication management. Patient was seen by his PCP on 03/03/24 and reported right leg swelling over the previous month. D-dimer was elevated so he was referred for ultrasound to rule out DVT and started on Eliquis . Venous ultrasound on 03/18/2024 was positive for occlusive DVT involving the right common femoral, femoral, and popliteal vein. Limited assessment of calf veins, but suspected occlusive DVT involving the right posterior tibial veins. Peroneal veins were not visualized and iliac veins were not evaluated. He was seen by his PCP on 03/26/2024 for acute worsening of right leg swelling. He confirmed adherence to Eliquis  and was referred to DVT Clinic for follow up and surgical evaluation. We arranged for vascular ultrasound to evaluate IVC/iliac veins given concern for more proximal extension as occlusive thrombus was seen in the common femoral vein on his previous exam. Ultrasound today showed acute and occlusive DVT involving the mid and distal external iliac vein.   Patient reports he began to have mild swelling off and on a couple months ago. Swelling continued so he was evaluated by his PCP. He started Eliquis  on 03/17/24 and denies missed doses. Denies abnormal bleeding/bruising. Over the past 2 days he reports his right leg swelling has significantly worsened. Denies pain in his leg. Endorses wearing compression stockings. Reports that a cause was not found for his previous DVT in 2014 and he was  treated with Xarelto  for about a year. Denies recent prolonged sedentary periods, illness, long travel, or injury. Of note, reports that Xarelto  made him feel nervous and feels like he is tolerating Eliquis  much better.  Positive Thrombotic Risk Factors: Previous VTE, Older Age, Obesity Bleeding Risk Factors: Age >65 years  Negative Thrombotic Risk Factors: Recent surgery (within 3 months), Recent trauma (within 3 months), Recent admission to hospital with acute illness (within 3 months), Paralysis, paresis, or recent plaster cast immobilization of lower extremity, Central venous catheterization, Sedentary journey lasting >8 hours within 4 weeks, Pregnancy, Estrogen therapy, Recent cesarean section (within 3 months), Within 6 weeks postpartum, Bed rest >72 hours within 3 months, Testosterone  therapy, Erythropoiesis-stimulating agent, Recent COVID diagnosis (within 3 months), Known thrombophilic condition, Smoking, Non-malignant, chronic inflammatory condition, Active cancer  Rx Insurance Coverage: Medicare Rx Affordability: Eliquis  is ~$249 for first month due to his deductible which he said is affordable Rx Assistance Provided: None needed Preferred Pharmacy: Eliquis  refills sent to [facility]  Past Medical History:  Diagnosis Date   GERD (gastroesophageal reflux disease)    Hemorrhoids    History of Helicobacter pylori infection    HTN (hypertension)    Hyperlipemia    Obesity    Peripheral vascular disease 9/14   DVT  left lower leg   Prostate cancer (HCC)    Vitamin D  deficiency     Past Surgical History:  Procedure Laterality Date   CYSTOSCOPY/RETROGRADE/URETEROSCOPY Left 01/02/2013   Procedure: CYSTOSCOPY/LEFT URETERAL WASHINGS/LEFT RETROGRADE PYELOGRAM/ LEFT  URETEROSCOPY/URETERAL BIOPSY. BLADDER BIOPSY;  Surgeon: Morene LELON Salines, MD;  Location: [facility];  Service: Urology;  Laterality: Left;  With STENT   HEMORRHOID SURGERY     age 37   TONSILLECTOMY     TOTAL HIP  ARTHROPLASTY Left     Social History   Socioeconomic History   Marital status: Married    Spouse name: Not on file   Number of children: 1   Years of education: Not on file   Highest education level: Associate degree: academic program  Occupational History   Occupation: Building Control Surveyor: [employer]  Tobacco Use   Smoking status: Never   Smokeless tobacco: Never  Substance and Sexual Activity   Alcohol use: No   Drug use: No   Sexual activity: Yes  Other Topics Concern   Not on file  Social History Narrative   Not on file   Social Drivers of Health   Tobacco Use: Low Risk (03/26/2024)   Patient History    Smoking Tobacco Use: Never    Smokeless Tobacco Use: Never    Passive Exposure: Not on file  Financial Resource Strain: Low Risk (09/01/2023)   Overall Financial Resource Strain (CARDIA)    Difficulty of Paying Living Expenses: Not hard at all  Food Insecurity: No Food Insecurity (09/01/2023)   Epic    Worried About Radiation Protection Practitioner of Food in the Last Year: Never true    Ran Out of Food in the Last Year: Never true  Transportation Needs: No Transportation Needs (09/01/2023)   Epic    Lack of Transportation (Medical): No    Lack of Transportation (Non-Medical): No  Physical Activity: Inactive (09/01/2023)   Exercise Vital Sign    Days of Exercise per Week: 0 days    Minutes of Exercise per Session: Not on file  Stress: No Stress Concern Present (09/01/2023)   [employer] of Occupational Health - Occupational Stress Questionnaire    Feeling of Stress: Only a little  Social Connections: Socially Integrated (09/01/2023)   Social Connection and Isolation Panel    Frequency of Communication with Friends and Family: More than three times a week    Frequency of Social Gatherings with Friends and Family: Three times a week    Attends Religious Services: More than 4 times per year    Active Member of Clubs or Organizations: Yes    Attends Tax Inspector Meetings: More than 4 times per year    Marital Status: Married  Catering Manager Violence: Not on file  Depression (PHQ2-9): Low Risk (03/03/2024)   Depression (PHQ2-9)    PHQ-2 Score: 1  Alcohol Screen: Not on file  Housing: Low Risk (09/01/2023)   Epic    Unable to Pay for Housing in the Last Year: No    Number of Times Moved in the Last Year: 0    Homeless in the Last Year: No  Utilities: Not on file  Health Literacy: Not on file    Family History  Problem Relation Age of Onset   Diabetes Father    Heart disease Father    Cancer Mother        Bladder    Allergies as of 03/27/2024 - Review Complete 03/27/2024  Allergen Reaction Noted   Ace inhibitors Other (See Comments) 12/12/2012   Citalopram Nausea And Vomiting 12/12/2012    Medications Ordered Prior to Encounter[1] REVIEW OF SYSTEMS:  Review of Systems  Respiratory:  Negative for shortness of  breath.   Cardiovascular:  Positive for leg swelling. Negative for chest pain.  Musculoskeletal:  Negative for myalgias.   PHYSICAL EXAMINATION:  Vitals:   03/27/24 1152  BP: 127/76  Pulse: 72  SpO2: 95%    There is no height or weight on file to calculate BMI.  Physical Exam Vitals reviewed.  Pulmonary:     Effort: Pulmonary effort is normal.  Musculoskeletal:        General: No tenderness.     Right lower leg: Edema present.  Neurological:     Mental Status: He is alert.  Psychiatric:        Mood and Affect: Mood normal.    Villalta Score for Post-Thrombotic Syndrome: Pain: Absent Cramps: Absent Heaviness: Mild Paresthesia: Absent Pruritus: Absent Pretibial Edema: Severe Skin Induration: Absent Hyperpigmentation: Absent Redness: Absent Venous Ectasia: Absent Pain on calf compression: Absent Villalta Preliminary Score: 4 Is venous ulcer present?: No If venous ulcer is present and score is <15, then 15 points total are assigned: Absent Villalta Total Score: 4  LABS:  CBC     Component  Value Date/Time   WBC 6.0 08/23/2023 0822   WBC 7.7 02/07/2023 1525   RBC 4.99 08/23/2023 0822   RBC 5.17 02/07/2023 1525   HGB 15.9 08/23/2023 0822   HCT 46.8 08/23/2023 0822   PLT 200 08/23/2023 0822   MCV 94 08/23/2023 0822   MCH 31.9 08/23/2023 0822   MCH 30.2 02/07/2023 1525   MCHC 34.0 08/23/2023 0822   MCHC 33.5 02/07/2023 1525   RDW 13.0 08/23/2023 0822   LYMPHSABS 1.4 08/23/2023 0822   MONOABS 936 08/01/2016 1536   EOSABS 0.3 08/23/2023 0822   BASOSABS 0.0 08/23/2023 0822    Hepatic Function      Component Value Date/Time   PROT 6.7 08/23/2023 0822   ALBUMIN 4.3 08/23/2023 0822   AST 19 08/23/2023 0822   ALT 21 08/23/2023 0822   ALKPHOS 79 08/23/2023 0822   BILITOT 0.4 08/23/2023 0822   BILIDIR 0.1 06/03/2018 1408   IBILI 0.2 06/03/2018 1408    Renal Function   Lab Results  Component Value Date   CREATININE 1.08 08/23/2023   CREATININE 1.05 02/07/2023   CREATININE 1.11 07/31/2022    CrCl cannot be calculated (Patient's most recent lab result is older than the maximum 21 days allowed.).   VVS Vascular Lab Studies:  03/27/2024 VAS US  IVC/ILIAC (VENOUS ONLY) Summary:  IVC/Iliac: LImited visualization of the IVC and proximal common iliac vein  appear patent and without thrombus.  Acute and occlusive DVT involving the mid and distal external iliac vein.   03/18/2024 US  Venous Img Lower Bilateral:  FINDINGS: RIGHT LOWER EXTREMITY Common Femoral Vein: Hypoechoic intraluminal thrombus. Vessel is noncompressible. Thrombus appears occlusive.   Saphenofemoral Junction: No evidence of thrombus. Normal compressibility and flow on color Doppler imaging.   Profunda Femoral Vein: Thrombus appears to extend into the profunda femoral vein. Vessel noncompressible.   Femoral Vein: Diffuse hypoechoic thrombus throughout. Vein is noncompressible. Thrombus appears occlusive.   Popliteal Vein: Diffuse hypoechoic thrombus also noted. Vein is noncompressible. Thrombus  nearly occlusive.   Calf Veins: Limited assessment of the calf veins. Thrombus suspected in the posterior tibial veins appearing noncompressible and occlusive. Peroneal vein not visualized.   LEFT LOWER EXTREMITY Common Femoral Vein: No evidence of thrombus. Normal compressibility, respiratory phasicity and response to augmentation.   Saphenofemoral Junction: No evidence of thrombus. Normal compressibility and flow on color Doppler imaging.   Profunda Femoral Vein:  No evidence of thrombus. Normal compressibility and flow on color Doppler imaging.   Femoral Vein: No evidence of thrombus. Normal compressibility, respiratory phasicity and response to augmentation.   Popliteal Vein: No evidence of thrombus. Normal compressibility, respiratory phasicity and response to augmentation.   Calf Veins: Limited assessment of the calf veins. Posterior tibial vein appears patent. Peroneal vein not visualized.   IMPRESSION: 1. Positive exam for extensive right lower extremity DVT from the common femoral vein through the calf veins. 2. No significant left lower extremity DVT.  ASSESSMENT: Location of DVT: Right iliac vein, Right femoral vein, Right common femoral vein, Right popliteal vein, Right distal vein Cause of DVT: unprovoked  Patient with prior history of LLE DVT now diagnosed with extensive, occlusive DVT involving the right external iliac, common femoral, femoral, popliteal, and posterior tibial veins. He was appropriately started on Eliquis  VTE dosing. He has significant swelling extending up to his groin. Given DVT extends to the external iliac vein discussed with Dr. Pearline who came to evaluate the patient. Per Dr. Pearline plan for percutaneous mechanical thrombectomy next Wednesday with Dr. Lanis due to patient preference to avoid scheduling earlier next week out of concern for winter weather. No clear provoking factors identified. Given this is his second unprovoked DVT, would  recommend indefinite anticoagulation. Discussed referral to hematology for hypercoagulable work up although this likely would not change management. Patient preferred to wait on this given the stress of the situation and caring for his wife. Counseled that he could discuss further with his PCP which he said he would do. Extensively counseled on Eliquis  and all of his questions were answered. No medication adherence barriers identified. Counseled to wear compression stockings and elevate his legs to help with swelling.  PLAN: -Continue apixaban  (Eliquis ) 5 mg twice daily. -Expected duration of therapy: Indefinite. Therapy started on 03/17/2024. -Patient educated on purpose, proper use and potential adverse effects of apixaban  (Eliquis ). -Discussed importance of taking medication around the same time every day. -Advised patient of medications to avoid (NSAIDs, aspirin doses >100 mg daily). -Educated that Tylenol  (acetaminophen ) is the preferred analgesic to lower the risk of bleeding. -Advised patient to alert all providers of anticoagulation therapy prior to starting a new medication or having a procedure. -Emphasized importance of monitoring for signs and symptoms of bleeding (abnormal bruising, prolonged bleeding, nose bleeds, bleeding from gums, discolored urine, black tarry stools). -Educated patient to present to the ED if emergent signs and symptoms of new thrombosis occur. -Counseled patient to wear compression stockings daily, removing at night.  Follow up: Follow up after thrombectomy  Izetta Henry, PharmD, CPP [facility] [phone]     [1]  Current Outpatient Medications on File Prior to Visit  Medication Sig Dispense Refill   bisoprolol -hydrochlorothiazide  (ZIAC ) 10-6.25 MG tablet TAKE 1 TABLET BY MOUTH DAILY FOR BLOOD PRESSURE 30 tablet 6   losartan  (COZAAR ) 50 MG tablet Take 1 tablet (50 mg total) by mouth in the morning and at  bedtime. 180 tablet 1   omeprazole  (PRILOSEC) 40 MG capsule TAKE 1 CAPSULE BY MOUTH TWICE A DAY (MORNING AND EVENING) TO PREVENT HEARTBURN AND INDIGESTION (Patient taking differently: Take 40 mg by mouth every other day.) 180 capsule 3   Cholecalciferol (VITAMIN D3) 5000 UNITS CAPS Take 5,000 Units by mouth daily.     terbinafine  (LAMISIL ) 250 MG tablet Take 1 tablet (250 mg total) by mouth daily. Take 1 pill daily for one month, off for 1  month, one a month, etc until gone 90 tablet 0   No current facility-administered medications on file prior to visit.   "

## 2024-03-26 NOTE — Patient Instructions (Signed)
 It was nice to see you today,  We addressed the following topics today: - if you do not hear from someone from the DVT clinic by 4pm call them:  [place] [facility] at [facility] [address], [place], [place], [place] [address] Phone: [phone]  - continue your eliquis   - if you have any of the symptoms of a pulmonary embolism we discussed have someone take you to the emergency department  Have a great day,  Rolan Slain, MD

## 2024-03-26 NOTE — Progress Notes (Unsigned)
 70cm, 58 cm.     Acute Office Visit  Subjective:     Patient ID: Jason Patton., male    DOB: October 26, 1950, 74 y.o.   MRN: [id number]  No chief complaint on file.   HPI Patient is in today for   Acute worsening of right leg swelling.   - worsened overnight w/ swelling going past the knee - pain is minimal, with mild tenderness behind the knee.  - no sob, cp, dyspnea - compliant w/ eliquis    ROS      Objective:    BP 120/75   Pulse 84   Ht 5' 11 (1.803 m)   Wt 260 lb (117.9 kg)   SpO2 96%   BMI 36.26 kg/m  {Vitals History (Optional):23777}  Physical Exam Gen: alert, oriented Pulm: no respiratory distress Ext: right leg more swollen than left, extending to the groin. Mid thigh circumference asymmetry (70cm in right, 58 cm in left).  Mild erythema extending up the thigh. No tenderness or warmth.  Psych: pleasant affect  No results found for any visits on 03/26/24.      Assessment & Plan:   Acute deep vein thrombosis (DVT) of proximal vein of lower extremity, unspecified laterality (HCC) Assessment & Plan: Worsening overnight.  About 10-12 cm of discrepancy in mid thigh circumference.  Compliant w/ eliquis .  Spoke with pharmacist at [facility] clinic.  Recommended continued eliquis  and will try to see pt tomorrow in DVT clinic.  Previous dvt u/s showed extension into common femoral vein but imaging stopped short of being able to see the full extent.  Gave pt info on dvt clinic, advised him to call them if he does not hear by 4pm.  Referral sent.  Advised on signs/sx of PE and recommended ED evaluation if these sx occur.   Orders: -     AMB Referral to [facility] Clinic     Return if symptoms worsen or fail to improve.  Toribio MARLA Slain, MD

## 2024-03-27 ENCOUNTER — Other Ambulatory Visit: Payer: Self-pay

## 2024-03-27 ENCOUNTER — Ambulatory Visit (HOSPITAL_COMMUNITY)
Admission: RE | Admit: 2024-03-27 | Discharge: 2024-03-27 | Disposition: A | Source: Ambulatory Visit | Attending: Vascular Surgery

## 2024-03-27 ENCOUNTER — Other Ambulatory Visit (HOSPITAL_COMMUNITY): Payer: Self-pay | Admitting: Family Medicine

## 2024-03-27 ENCOUNTER — Ambulatory Visit: Admitting: Pharmacist

## 2024-03-27 VITALS — BP 127/76 | HR 72

## 2024-03-27 DIAGNOSIS — I82411 Acute embolism and thrombosis of right femoral vein: Secondary | ICD-10-CM | POA: Insufficient documentation

## 2024-03-27 DIAGNOSIS — I82421 Acute embolism and thrombosis of right iliac vein: Secondary | ICD-10-CM

## 2024-03-27 DIAGNOSIS — I824Y1 Acute embolism and thrombosis of unspecified deep veins of right proximal lower extremity: Secondary | ICD-10-CM

## 2024-03-27 MED ORDER — APIXABAN 5 MG PO TABS: 5.0000 mg | ORAL_TABLET | Freq: Two times a day (BID) | ORAL | 4 refills | Status: AC

## 2024-03-27 NOTE — Patient Instructions (Signed)
-  Continue apixaban  (Eliquis ) 5 mg twice daily. -Your refills have been sent to [facility]. You may need to call the pharmacy to ask them to fill this when you start to run low on your current supply.  -It is important to take your medication around the same time every day.  -Avoid NSAIDs like ibuprofen (Advil, Motrin) and naproxen (Aleve) as well as aspirin doses over 100 mg daily. -Tylenol  (acetaminophen ) is the preferred over the counter pain medication to lower the risk of bleeding. -Be sure to alert all of your health care providers that you are taking an anticoagulant prior to starting a new medication or having a procedure. -Monitor for signs and symptoms of bleeding (abnormal bruising, prolonged bleeding, nose bleeds, bleeding from gums, discolored urine, black tarry stools). If you have fallen and hit your head OR if your bleeding is severe or not stopping, seek emergency care.  -Go to the emergency room if emergent signs and symptoms of new clot occur (new or worse swelling and pain in an arm or leg, shortness of breath, chest pain, fast or irregular heartbeats, lightheadedness, dizziness, fainting, coughing up blood) or if you experience a significant color change (pale or blue) in the extremity that has the DVT.  -We recommend you wear compression stockings (20-30 mmHg) as long as you are having swelling or pain. Be sure to purchase the correct size and take them off at night.   If you have any questions or need to reschedule an appointment, please call [phone]. If you are having an emergency, call 911 or present to the nearest emergency room.   What is a DVT?  -Deep vein thrombosis (DVT) is a condition in which a blood clot forms in a vein of the deep venous system which can occur in the lower leg, thigh, pelvis, arm, or neck. This condition is serious and can be life-threatening if the clot travels to the arteries of the lungs and causing a blockage (pulmonary embolism, PE). A DVT  can also damage veins in the leg, which can lead to long-term venous disease, leg pain, swelling, discoloration, and ulcers or sores (post-thrombotic syndrome).  -Treatment may include taking an anticoagulant medication to prevent more clots from forming and the current clot from growing, wearing compression stockings, and/or surgical procedures to remove or dissolve the clot.

## 2024-04-01 ENCOUNTER — Ambulatory Visit (HOSPITAL_COMMUNITY)
Admission: RE | Admit: 2024-04-01 | Discharge: 2024-04-01 | Disposition: A | Attending: Vascular Surgery | Admitting: Vascular Surgery

## 2024-04-01 ENCOUNTER — Encounter (HOSPITAL_COMMUNITY): Admission: RE | Disposition: A | Payer: Self-pay | Source: Home / Self Care | Attending: Vascular Surgery

## 2024-04-01 ENCOUNTER — Other Ambulatory Visit: Payer: Self-pay

## 2024-04-01 DIAGNOSIS — I82421 Acute embolism and thrombosis of right iliac vein: Secondary | ICD-10-CM | POA: Diagnosis present

## 2024-04-01 DIAGNOSIS — Z7901 Long term (current) use of anticoagulants: Secondary | ICD-10-CM | POA: Insufficient documentation

## 2024-04-01 DIAGNOSIS — I824Y1 Acute embolism and thrombosis of unspecified deep veins of right proximal lower extremity: Secondary | ICD-10-CM

## 2024-04-01 DIAGNOSIS — I82511 Chronic embolism and thrombosis of right femoral vein: Secondary | ICD-10-CM | POA: Diagnosis not present

## 2024-04-01 DIAGNOSIS — I82521 Chronic embolism and thrombosis of right iliac vein: Secondary | ICD-10-CM

## 2024-04-01 LAB — POCT I-STAT, CHEM 8
BUN: 20 mg/dL (ref 8–23)
Calcium, Ion: 1.13 mmol/L — ABNORMAL LOW (ref 1.15–1.40)
Chloride: 97 mmol/L — ABNORMAL LOW (ref 98–111)
Creatinine, Ser: 1.1 mg/dL (ref 0.61–1.24)
Glucose, Bld: 117 mg/dL — ABNORMAL HIGH (ref 70–99)
HCT: 45 % (ref 39.0–52.0)
Hemoglobin: 15.3 g/dL (ref 13.0–17.0)
Potassium: 4 mmol/L (ref 3.5–5.1)
Sodium: 138 mmol/L (ref 135–145)
TCO2: 32 mmol/L (ref 22–32)

## 2024-04-01 MED ORDER — FENTANYL CITRATE (PF) 100 MCG/2ML IJ SOLN
INTRAMUSCULAR | Status: AC
  Filled 2024-04-01: qty 2

## 2024-04-01 MED ORDER — HEPARIN SODIUM (PORCINE) 1000 UNIT/ML IJ SOLN
INTRAMUSCULAR | Status: AC
  Filled 2024-04-01: qty 10

## 2024-04-01 MED ORDER — HEPARIN (PORCINE) IN NACL 1000-0.9 UT/500ML-% IV SOLN
INTRAVENOUS | Status: DC | PRN
  Administered 2024-04-01: 500 mL

## 2024-04-01 MED ORDER — FENTANYL CITRATE (PF) 100 MCG/2ML IJ SOLN
INTRAMUSCULAR | Status: DC | PRN
  Administered 2024-04-01: 50 ug via INTRAVENOUS

## 2024-04-01 MED ORDER — SODIUM CHLORIDE 0.9 % IV SOLN: INTRAVENOUS | Status: DC

## 2024-04-01 MED ORDER — MIDAZOLAM HCL (PF) 2 MG/2ML IJ SOLN
INTRAMUSCULAR | Status: DC | PRN
  Administered 2024-04-01: 1 mg via INTRAVENOUS

## 2024-04-01 MED ORDER — LIDOCAINE HCL (PF) 1 % IJ SOLN
INTRAMUSCULAR | Status: AC
  Filled 2024-04-01: qty 30

## 2024-04-01 MED ORDER — HEPARIN SODIUM (PORCINE) 1000 UNIT/ML IJ SOLN
INTRAMUSCULAR | Status: DC | PRN
  Administered 2024-04-01: 8000 [IU] via INTRAVENOUS

## 2024-04-01 MED ORDER — MIDAZOLAM HCL 2 MG/2ML IJ SOLN
INTRAMUSCULAR | Status: AC
  Filled 2024-04-01: qty 2

## 2024-04-01 MED ORDER — LIDOCAINE HCL (PF) 1 % IJ SOLN
INTRAMUSCULAR | Status: DC | PRN
  Administered 2024-04-01: 5 mL via INTRADERMAL

## 2024-04-01 MED ORDER — IODIXANOL 320 MG/ML IV SOLN
INTRAVENOUS | Status: DC | PRN
  Administered 2024-04-01: 60 mL

## 2024-04-01 NOTE — Op Note (Signed)
 "   Patient name: Jason Patton. MRN: [id number] DOB: [birth year] Sex: male  04/01/2024 Pre-operative Diagnosis: Right lower extremity iliofemoral deep venous thrombosis Post-operative diagnosis:  Same Surgeon:  Fonda FORBES Rim, MD Procedure Performed: 1.  Ultrasound-guided micropuncture access of the right popliteal vein 2.  Intravascular ultrasound right popliteal vein femoral vein common femoral vein external iliac vein common iliac vein inferior vena cava 3.  Right lower extremity venogram, ilio cavogram 4.  Percutaneous mechanical thrombectomy Inari clot Triever right external iliac vein. Common femoral vein  5.  Balloon venoplasty external iliac vein, common femoral vein 10 x 60 mm   Indications: Patient is a 74 year old male with prior history of DVT in the left lower extremity presenting to our office with new onset right lower extremity swelling and imaging demonstrating acute DVT.  DVT extended into the iliac vein.  After discussing the risks and benefits of right lower extremity venogram with possible intervention due to significant pain and swelling in the right leg, Eppard elected to proceed.  Findings:  Thrombus removed from the external iliac vein, common femoral vein was chronic.  No acute component.  Widely patent inferior vena cava Widely patent right common iliac vein Occluded right external iliac vein, common femoral vein Paired femoral veins with significant collaterals throughout the popliteal vein and thigh indicative of chronicity with likely prior right lower extremity DVT.   Procedure:   Patient brought to the Cath Lab and laid in the prone position.  Moderate anesthesia was induced using fentanyl  and Versed  and patient prepped and draped in standard fashion.  A timeout was performed.  The case began with ultrasound-guided popliteal vein access in the right popliteal vein.  This proved difficult due to significant collaterals, and no true popliteal vein,  indicative of some element of chronicity.  I was able to access the vein using an ultrasound-guided micropuncture needle and wire.  Several right lower extremity venogram images followed in an effort to navigate the dense collateral network to what appeared to be appeared femoral system.  See results of the venogram above. Was able to run a wire through the paired femoral system into the inferior vena cava, and subsequently the right subclavian vein.  At this point, I elected to use intravascular ultrasound to assess the thrombus as all contrast held up at the right common femoral vein.  See results above.  The patient was then heparinized.  I elected to use the Inari clot Treiber device.  The 13 French sheath was placed in the popliteal vein, and the device was positioned, deployed from the right common iliac vein into the paired femoral vein.  In total, 4 passes were made in the clockwise position at the 12:00, 3:00, 6:00, 9:00 positions.  All clot removed was chronic.  Follow-up intravascular ultrasound demonstrated narrowing in the external iliac vein.  I elected to use a 10 mm balloon to balloon venoplasty the external iliac vein, common femoral vein.   Follow-up angiography demonstrated some improvement, however there was still some rebound, once again indicative of scarring and chronicity.  At this point, the patient had inline flow from the popliteal veins into the inferior vena cava.  I was happy with this result.  He was taken to PACU in stable condition after the wire and sheath were removed and a Monocryl suture placed at the level of the skin.  Impression: Successful recanalization of the native left sided venous system with mechanical thrombectomy of the external iliac vein, common femoral  vein, with subsequent balloon venoplasty.    Fonda FORBES Rim MD [facility] [place] Office: [phone]    "

## 2024-04-01 NOTE — Discharge Instructions (Signed)
 Site Care This sheet gives you information about how to care for yourself after your procedure. Your health care provider may also give you more specific instructions. If you have problems or questions, contact your health care provider. What can I expect after the procedure?  After the procedure, it is common to have: Bruising that usually fades within 1-2 weeks. Tenderness at the site. Follow these instructions at home: Wound care Follow instructions from your health care provider about how to take care of your insertion site. Make sure you: Wash your hands with soap and water before you change your bandage (dressing). If soap and water are not available, use hand sanitizer. Remove your dressing as told by your health care provider. In 24 hours Do not take baths, swim, or use a hot tub until your health care provider approves. You may shower 24-48 hours after the procedure or as told by your health care provider. Gently wash the site with plain soap and water. Pat the area dry with a clean towel. Do not rub the site. This may cause bleeding. Do not apply powder or lotion to the site. Keep the site clean and dry. Check your femoral site every day for signs of infection. Check for: Redness, swelling, or pain. Fluid or blood. Warmth. Pus or a bad smell. Activity For the first 2-3 days after your procedure, or as long as directed: Avoid climbing stairs as much as possible. . Do not lift anything that is heavier than 10 lb (4.5 kg), or the limit that you are told, until your health care provider says that it is safe. For 5 days Rest as directed. Avoid sitting for a long time without moving. Get up to take short walks every 1-2 hours. Do not drive for 24 hours if you were given a medicine to help you relax (sedative). General instructions Take over-the-counter and prescription medicines only as told by your health care provider. Keep all follow-up visits as told by your health care  provider. This is important. Contact a health care provider if you have: A fever or chills. You have redness, swelling, or pain around your insertion site. Get help right away if: The catheter insertion area swells very fast. You pass out. You suddenly start to sweat or your skin gets clammy. The catheter insertion area is bleeding, and the bleeding does not stop when you hold steady pressure on the area. The area near or just beyond the catheter insertion site becomes pale, cool, tingly, or numb. These symptoms may represent a serious problem that is an emergency. Do not wait to see if the symptoms will go away. Get medical help right away. Call your local emergency services (911 in the [place] Do not drive yourself to the hospital. Summary After the procedure, it is common to have bruising that usually fades within 1-2 weeks. Check your femoral site every day for signs of infection. Do not lift anything that is heavier than 10 lb (4.5 kg), or the limit that you are told, until your health care provider says that it is safe. This information is not intended to replace advice given to you by your health care provider. Make sure you discuss any questions you have with your health care provider. Document Revised: 03/04/2017 Document Reviewed: 03/04/2017 [employer] Patient Education  2020 [employer].

## 2024-04-01 NOTE — H&P (Signed)
 DVT Clinic Note  Patient seen and examined in preop holding.  No complaints. No changes to medication history or physical exam since last seen in clinic. After discussing the risks and benefits of right lower extremity venous thrombectomy, Jason Patton. elected to proceed.   Fonda FORBES Rim MD    Name: Jason Patton.     MRN: [id number]     DOB: November 12, 1950     Sex: male  PCP: Chandra Toribio POUR, MD  Today's Visit:    Referred to DVT Clinic by: Primary Care - Toribio Chandra, MD Referred to CPP by: Dr. Pearline Reason for referral:  No chief complaint on file.  HISTORY OF PRESENT ILLNESS: Jason Patton. is a 74 y.o. male with PMH DVT in left femoral, popliteal, and calf veins in 2014 who presents after diagnosis of DVT for medication management. Patient was seen by his PCP on 03/03/24 and reported right leg swelling over the previous month. D-dimer was elevated so he was referred for ultrasound to rule out DVT and started on Eliquis . Venous ultrasound on 03/18/2024 was positive for occlusive DVT involving the right common femoral, femoral, and popliteal vein. Limited assessment of calf veins, but suspected occlusive DVT involving the right posterior tibial veins. Peroneal veins were not visualized and iliac veins were not evaluated. He was seen by his PCP on 03/26/2024 for acute worsening of right leg swelling. He confirmed adherence to Eliquis  and was referred to DVT Clinic for follow up and surgical evaluation. We arranged for vascular ultrasound to evaluate IVC/iliac veins given concern for more proximal extension as occlusive thrombus was seen in the common femoral vein on his previous exam. Ultrasound today showed acute and occlusive DVT involving the mid and distal external iliac vein.   Patient reports he began to have mild swelling off and on a couple months ago. Swelling continued so he was evaluated by his PCP. He started Eliquis  on 03/17/24 and denies missed doses. Denies abnormal  bleeding/bruising. Over the past 2 days he reports his right leg swelling has significantly worsened. Denies pain in his leg. Endorses wearing compression stockings. Reports that a cause was not found for his previous DVT in 2014 and he was treated with Xarelto  for about a year. Denies recent prolonged sedentary periods, illness, long travel, or injury. Of note, reports that Xarelto  made him feel nervous and feels like he is tolerating Eliquis  much better.          Rx Insurance Coverage: Medicare Rx Affordability: Eliquis  is ~$249 for first month due to his deductible which he said is affordable Rx Assistance Provided: None needed Preferred Pharmacy: Eliquis  refills sent to [facility]  Past Medical History:  Diagnosis Date   GERD (gastroesophageal reflux disease)    Hemorrhoids    History of Helicobacter pylori infection    HTN (hypertension)    Hyperlipemia    Obesity    Peripheral vascular disease 9/14   DVT  left lower leg   Prostate cancer (HCC)    Vitamin D  deficiency     Past Surgical History:  Procedure Laterality Date   CYSTOSCOPY/RETROGRADE/URETEROSCOPY Left 01/02/2013   Procedure: CYSTOSCOPY/LEFT URETERAL WASHINGS/LEFT RETROGRADE PYELOGRAM/ LEFT URETEROSCOPY/URETERAL BIOPSY. BLADDER BIOPSY;  Surgeon: Morene LELON Salines, MD;  Location: [facility];  Service: Urology;  Laterality: Left;  With STENT   HEMORRHOID SURGERY     age 75   TONSILLECTOMY     TOTAL HIP ARTHROPLASTY Left     Social History  Socioeconomic History   Marital status: Married    Spouse name: Not on file   Number of children: 1   Years of education: Not on file   Highest education level: Associate degree: academic program  Occupational History   Occupation: Building Control Surveyor: [employer]  Tobacco Use   Smoking status: Never   Smokeless tobacco: Never  Substance and Sexual Activity   Alcohol use: No   Drug use: No   Sexual activity: Yes  Other Topics Concern   Not on file   Social History Narrative   Not on file   Social Drivers of Health   Tobacco Use: Low Risk (03/26/2024)   Patient History    Smoking Tobacco Use: Never    Smokeless Tobacco Use: Never    Passive Exposure: Not on file  Financial Resource Strain: Low Risk (09/01/2023)   Overall Financial Resource Strain (CARDIA)    Difficulty of Paying Living Expenses: Not hard at all  Food Insecurity: No Food Insecurity (09/01/2023)   Epic    Worried About Radiation Protection Practitioner of Food in the Last Year: Never true    Ran Out of Food in the Last Year: Never true  Transportation Needs: No Transportation Needs (09/01/2023)   Epic    Lack of Transportation (Medical): No    Lack of Transportation (Non-Medical): No  Physical Activity: Inactive (09/01/2023)   Exercise Vital Sign    Days of Exercise per Week: 0 days    Minutes of Exercise per Session: Not on file  Stress: No Stress Concern Present (09/01/2023)   [employer] of Occupational Health - Occupational Stress Questionnaire    Feeling of Stress: Only a little  Social Connections: Socially Integrated (09/01/2023)   Social Connection and Isolation Panel    Frequency of Communication with Friends and Family: More than three times a week    Frequency of Social Gatherings with Friends and Family: Three times a week    Attends Religious Services: More than 4 times per year    Active Member of Clubs or Organizations: Yes    Attends Banker Meetings: More than 4 times per year    Marital Status: Married  Catering Manager Violence: Not on file  Depression (PHQ2-9): Low Risk (03/03/2024)   Depression (PHQ2-9)    PHQ-2 Score: 1  Alcohol Screen: Not on file  Housing: Low Risk (09/01/2023)   Epic    Unable to Pay for Housing in the Last Year: No    Number of Times Moved in the Last Year: 0    Homeless in the Last Year: No  Utilities: Not on file  Health Literacy: Not on file    Family History  Problem Relation Age of Onset   Diabetes Father     Heart disease Father    Cancer Mother        Bladder    Allergies as of 03/27/2024 - Review Complete 03/27/2024  Allergen Reaction Noted   Ace inhibitors Other (See Comments) 12/12/2012   Citalopram Nausea And Vomiting 12/12/2012    [Medications Ordered Prior to [facility] Ordered Prior to [facility] No current facility-administered medications on file prior to encounter.   Current Outpatient Medications on File Prior to Encounter  Medication Sig Dispense Refill   apixaban  (ELIQUIS ) 5 MG TABS tablet Take 1 tablet (5 mg total) by mouth 2 (two) times daily. 60 tablet 4   bisoprolol -hydrochlorothiazide  (ZIAC ) 10-6.25 MG tablet TAKE 1 TABLET BY MOUTH DAILY  FOR BLOOD PRESSURE 30 tablet 6   Cholecalciferol (VITAMIN D3) 5000 UNITS CAPS Take 5,000 Units by mouth daily.     losartan  (COZAAR ) 50 MG tablet Take 1 tablet (50 mg total) by mouth in the morning and at bedtime. 180 tablet 1   omeprazole  (PRILOSEC) 40 MG capsule TAKE 1 CAPSULE BY MOUTH TWICE A DAY (MORNING AND EVENING) TO PREVENT HEARTBURN AND INDIGESTION (Patient taking differently: Take 40 mg by mouth every other day.) 180 capsule 3   terbinafine  (LAMISIL ) 250 MG tablet Take 1 tablet (250 mg total) by mouth daily. Take 1 pill daily for one month, off for 1 month, one a month, etc until gone 90 tablet 0   REVIEW OF SYSTEMS:  Review of Systems  Respiratory:  Negative for shortness of breath.   Cardiovascular:  Positive for leg swelling. Negative for chest pain.  Musculoskeletal:  Negative for myalgias.   PHYSICAL EXAMINATION:  Vitals:   04/01/24 1500 04/01/24 1505 04/01/24 1510 04/01/24 1515  BP: (!) 142/96 (!) 161/88 (!) 153/95   Pulse: 65 72 66 (!) 0  Resp: 13 17 15    Temp:      TempSrc:      SpO2: 99% 98% 99%   Weight:      Height:        Body mass index is 35.67 kg/m.  Physical Exam Vitals reviewed.  Pulmonary:     Effort: Pulmonary effort is normal.  Musculoskeletal:        General: No tenderness.      Right lower leg: Edema present.  Neurological:     Mental Status: He is alert.  Psychiatric:        Mood and Affect: Mood normal.    Villalta Score for Post-Thrombotic Syndrome:    LABS:  CBC     Component Value Date/Time   WBC 6.0 08/23/2023 0822   WBC 7.7 02/07/2023 1525   RBC 4.99 08/23/2023 0822   RBC 5.17 02/07/2023 1525   HGB 15.3 04/01/2024 1125   HGB 15.9 08/23/2023 0822   HCT 45.0 04/01/2024 1125   HCT 46.8 08/23/2023 0822   PLT 200 08/23/2023 0822   MCV 94 08/23/2023 0822   MCH 31.9 08/23/2023 0822   MCH 30.2 02/07/2023 1525   MCHC 34.0 08/23/2023 0822   MCHC 33.5 02/07/2023 1525   RDW 13.0 08/23/2023 0822   LYMPHSABS 1.4 08/23/2023 0822   MONOABS 936 08/01/2016 1536   EOSABS 0.3 08/23/2023 0822   BASOSABS 0.0 08/23/2023 0822    Hepatic Function      Component Value Date/Time   PROT 6.7 08/23/2023 0822   ALBUMIN 4.3 08/23/2023 0822   AST 19 08/23/2023 0822   ALT 21 08/23/2023 0822   ALKPHOS 79 08/23/2023 0822   BILITOT 0.4 08/23/2023 0822   BILIDIR 0.1 06/03/2018 1408   IBILI 0.2 06/03/2018 1408    Renal Function   Lab Results  Component Value Date   CREATININE 1.10 04/01/2024   CREATININE 1.08 08/23/2023   CREATININE 1.05 02/07/2023    Estimated Creatinine Clearance: 77.4 mL/min (by C-G formula based on SCr of 1.1 mg/dL).   [facility] Vascular Lab Studies:  03/27/2024 VAS US  IVC/ILIAC (VENOUS ONLY) Summary:  IVC/Iliac: LImited visualization of the IVC and proximal common iliac vein  appear patent and without thrombus.  Acute and occlusive DVT involving the mid and distal external iliac vein.   03/18/2024 US  Venous Img Lower Bilateral:  FINDINGS: RIGHT LOWER EXTREMITY Common Femoral Vein: Hypoechoic intraluminal thrombus. Vessel  is noncompressible. Thrombus appears occlusive.   Saphenofemoral Junction: No evidence of thrombus. Normal compressibility and flow on color Doppler imaging.   Profunda Femoral Vein: Thrombus appears to extend into  the profunda femoral vein. Vessel noncompressible.   Femoral Vein: Diffuse hypoechoic thrombus throughout. Vein is noncompressible. Thrombus appears occlusive.   Popliteal Vein: Diffuse hypoechoic thrombus also noted. Vein is noncompressible. Thrombus nearly occlusive.   Calf Veins: Limited assessment of the calf veins. Thrombus suspected in the posterior tibial veins appearing noncompressible and occlusive. Peroneal vein not visualized.   LEFT LOWER EXTREMITY Common Femoral Vein: No evidence of thrombus. Normal compressibility, respiratory phasicity and response to augmentation.   Saphenofemoral Junction: No evidence of thrombus. Normal compressibility and flow on color Doppler imaging.   Profunda Femoral Vein: No evidence of thrombus. Normal compressibility and flow on color Doppler imaging.   Femoral Vein: No evidence of thrombus. Normal compressibility, respiratory phasicity and response to augmentation.   Popliteal Vein: No evidence of thrombus. Normal compressibility, respiratory phasicity and response to augmentation.   Calf Veins: Limited assessment of the calf veins. Posterior tibial vein appears patent. Peroneal vein not visualized.   IMPRESSION: 1. Positive exam for extensive right lower extremity DVT from the common femoral vein through the calf veins. 2. No significant left lower extremity DVT.  ASSESSMENT:    Patient with prior history of LLE DVT now diagnosed with extensive, occlusive DVT involving the right external iliac, common femoral, femoral, popliteal, and posterior tibial veins. He was appropriately started on Eliquis  VTE dosing. He has significant swelling extending up to his groin. Given DVT extends to the external iliac vein discussed with Dr. Pearline who came to evaluate the patient. Per Dr. Pearline plan for percutaneous mechanical thrombectomy next Wednesday with Dr. Lanis due to patient preference to avoid scheduling earlier next week out of  concern for winter weather. No clear provoking factors identified. Given this is his second unprovoked DVT, would recommend indefinite anticoagulation. Discussed referral to hematology for hypercoagulable work up although this likely would not change management. Patient preferred to wait on this given the stress of the situation and caring for his wife. Counseled that he could discuss further with his PCP which he said he would do. Extensively counseled on Eliquis  and all of his questions were answered. No medication adherence barriers identified. Counseled to wear compression stockings and elevate his legs to help with swelling.  PLAN: -Continue apixaban  (Eliquis ) 5 mg twice daily. -Expected duration of therapy: Indefinite. Therapy started on 03/17/2024. -Patient educated on purpose, proper use and potential adverse effects of apixaban  (Eliquis ). -Discussed importance of taking medication around the same time every day. -Advised patient of medications to avoid (NSAIDs, aspirin doses >100 mg daily). -Educated that Tylenol  (acetaminophen ) is the preferred analgesic to lower the risk of bleeding. -Advised patient to alert all providers of anticoagulation therapy prior to starting a new medication or having a procedure. -Emphasized importance of monitoring for signs and symptoms of bleeding (abnormal bruising, prolonged bleeding, nose bleeds, bleeding from gums, discolored urine, black tarry stools). -Educated patient to present to the ED if emergent signs and symptoms of new thrombosis occur. -Counseled patient to wear compression stockings daily, removing at night.  Follow up: Follow up after thrombectomy  Izetta Henry, PharmD, CPP [facility] [phone] Clinic Note  Patient seen and examined in preop holding.  No complaints. No changes to medication history or physical exam since last seen in clinic. After discussing the risks  and benefits of  right lower extremity venous thrombectomy, Jason Patton. elected to proceed.   Fonda FORBES Rim MD    Name: Jason Patton.     MRN: [id number]     DOB: 24-Jun-1950     Sex: male  PCP: Chandra Toribio POUR, MD  Today's Visit:    Referred to DVT Clinic by: Primary Care - Toribio Chandra, MD Referred to CPP by: Dr. Pearline Reason for referral:  No chief complaint on file.  HISTORY OF PRESENT ILLNESS: Jason Patton. is a 74 y.o. male with PMH DVT in left femoral, popliteal, and calf veins in 2014 who presents after diagnosis of DVT for medication management. Patient was seen by his PCP on 03/03/24 and reported right leg swelling over the previous month. D-dimer was elevated so he was referred for ultrasound to rule out DVT and started on Eliquis . Venous ultrasound on 03/18/2024 was positive for occlusive DVT involving the right common femoral, femoral, and popliteal vein. Limited assessment of calf veins, but suspected occlusive DVT involving the right posterior tibial veins. Peroneal veins were not visualized and iliac veins were not evaluated. He was seen by his PCP on 03/26/2024 for acute worsening of right leg swelling. He confirmed adherence to Eliquis  and was referred to DVT Clinic for follow up and surgical evaluation. We arranged for vascular ultrasound to evaluate IVC/iliac veins given concern for more proximal extension as occlusive thrombus was seen in the common femoral vein on his previous exam. Ultrasound today showed acute and occlusive DVT involving the mid and distal external iliac vein.   Patient reports he began to have mild swelling off and on a couple months ago. Swelling continued so he was evaluated by his PCP. He started Eliquis  on 03/17/24 and denies missed doses. Denies abnormal bleeding/bruising. Over the past 2 days he reports his right leg swelling has significantly worsened. Denies pain in his leg. Endorses wearing compression stockings. Reports that a cause was not found for  his previous DVT in 2014 and he was treated with Xarelto  for about a year. Denies recent prolonged sedentary periods, illness, long travel, or injury. Of note, reports that Xarelto  made him feel nervous and feels like he is tolerating Eliquis  much better.          Rx Insurance Coverage: Medicare Rx Affordability: Eliquis  is ~$249 for first month due to his deductible which he said is affordable Rx Assistance Provided: None needed Preferred Pharmacy: Eliquis  refills sent to [facility]  Past Medical History:  Diagnosis Date   GERD (gastroesophageal reflux disease)    Hemorrhoids    History of Helicobacter pylori infection    HTN (hypertension)    Hyperlipemia    Obesity    Peripheral vascular disease 9/14   DVT  left lower leg   Prostate cancer (HCC)    Vitamin D  deficiency     Past Surgical History:  Procedure Laterality Date   CYSTOSCOPY/RETROGRADE/URETEROSCOPY Left 01/02/2013   Procedure: CYSTOSCOPY/LEFT URETERAL WASHINGS/LEFT RETROGRADE PYELOGRAM/ LEFT URETEROSCOPY/URETERAL BIOPSY. BLADDER BIOPSY;  Surgeon: Morene LELON Salines, MD;  Location: [facility];  Service: Urology;  Laterality: Left;  With STENT   HEMORRHOID SURGERY     age 74   TONSILLECTOMY     TOTAL HIP ARTHROPLASTY Left     Social History   Socioeconomic History   Marital status: Married    Spouse name: Not on file   Number of children: 1   Years of education: Not on file  Highest education level: Associate degree: academic program  Occupational History   Occupation: Building Control Surveyor: [employer]  Tobacco Use   Smoking status: Never   Smokeless tobacco: Never  Substance and Sexual Activity   Alcohol use: No   Drug use: No   Sexual activity: Yes  Other Topics Concern   Not on file  Social History Narrative   Not on file   Social Drivers of Health   Tobacco Use: Low Risk (03/26/2024)   Patient History    Smoking Tobacco Use: Never    Smokeless Tobacco Use: Never    Passive  Exposure: Not on file  Financial Resource Strain: Low Risk (09/01/2023)   Overall Financial Resource Strain (CARDIA)    Difficulty of Paying Living Expenses: Not hard at all  Food Insecurity: No Food Insecurity (09/01/2023)   Epic    Worried About Radiation Protection Practitioner of Food in the Last Year: Never true    Ran Out of Food in the Last Year: Never true  Transportation Needs: No Transportation Needs (09/01/2023)   Epic    Lack of Transportation (Medical): No    Lack of Transportation (Non-Medical): No  Physical Activity: Inactive (09/01/2023)   Exercise Vital Sign    Days of Exercise per Week: 0 days    Minutes of Exercise per Session: Not on file  Stress: No Stress Concern Present (09/01/2023)   [employer] of Occupational Health - Occupational Stress Questionnaire    Feeling of Stress: Only a little  Social Connections: Socially Integrated (09/01/2023)   Social Connection and Isolation Panel    Frequency of Communication with Friends and Family: More than three times a week    Frequency of Social Gatherings with Friends and Family: Three times a week    Attends Religious Services: More than 4 times per year    Active Member of Clubs or Organizations: Yes    Attends Banker Meetings: More than 4 times per year    Marital Status: Married  Catering Manager Violence: Not on file  Depression (PHQ2-9): Low Risk (03/03/2024)   Depression (PHQ2-9)    PHQ-2 Score: 1  Alcohol Screen: Not on file  Housing: Low Risk (09/01/2023)   Epic    Unable to Pay for Housing in the Last Year: No    Number of Times Moved in the Last Year: 0    Homeless in the Last Year: No  Utilities: Not on file  Health Literacy: Not on file    Family History  Problem Relation Age of Onset   Diabetes Father    Heart disease Father    Cancer Mother        Bladder    Allergies as of 03/27/2024 - Review Complete 03/27/2024  Allergen Reaction Noted   Ace inhibitors Other (See Comments) 12/12/2012    Citalopram Nausea And Vomiting 12/12/2012    [Medications Ordered Prior to [facility] Ordered Prior to [facility] No current facility-administered medications on file prior to encounter.   Current Outpatient Medications on File Prior to Encounter  Medication Sig Dispense Refill   apixaban  (ELIQUIS ) 5 MG TABS tablet Take 1 tablet (5 mg total) by mouth 2 (two) times daily. 60 tablet 4   bisoprolol -hydrochlorothiazide  (ZIAC ) 10-6.25 MG tablet TAKE 1 TABLET BY MOUTH DAILY FOR BLOOD PRESSURE 30 tablet 6   Cholecalciferol (VITAMIN D3) 5000 UNITS CAPS Take 5,000 Units by mouth daily.     losartan  (COZAAR ) 50 MG tablet Take 1  tablet (50 mg total) by mouth in the morning and at bedtime. 180 tablet 1   omeprazole  (PRILOSEC) 40 MG capsule TAKE 1 CAPSULE BY MOUTH TWICE A DAY (MORNING AND EVENING) TO PREVENT HEARTBURN AND INDIGESTION (Patient taking differently: Take 40 mg by mouth every other day.) 180 capsule 3   terbinafine  (LAMISIL ) 250 MG tablet Take 1 tablet (250 mg total) by mouth daily. Take 1 pill daily for one month, off for 1 month, one a month, etc until gone 90 tablet 0   REVIEW OF SYSTEMS:  Review of Systems  Respiratory:  Negative for shortness of breath.   Cardiovascular:  Positive for leg swelling. Negative for chest pain.  Musculoskeletal:  Negative for myalgias.   PHYSICAL EXAMINATION:  Vitals:   04/01/24 1500 04/01/24 1505 04/01/24 1510 04/01/24 1515  BP: (!) 142/96 (!) 161/88 (!) 153/95   Pulse: 65 72 66 (!) 0  Resp: 13 17 15    Temp:      TempSrc:      SpO2: 99% 98% 99%   Weight:      Height:        Body mass index is 35.67 kg/m.  Physical Exam Vitals reviewed.  Pulmonary:     Effort: Pulmonary effort is normal.  Musculoskeletal:        General: No tenderness.     Right lower leg: Edema present.  Neurological:     Mental Status: He is alert.  Psychiatric:        Mood and Affect: Mood normal.    Villalta Score for Post-Thrombotic Syndrome:     LABS:  CBC     Component Value Date/Time   WBC 6.0 08/23/2023 0822   WBC 7.7 02/07/2023 1525   RBC 4.99 08/23/2023 0822   RBC 5.17 02/07/2023 1525   HGB 15.3 04/01/2024 1125   HGB 15.9 08/23/2023 0822   HCT 45.0 04/01/2024 1125   HCT 46.8 08/23/2023 0822   PLT 200 08/23/2023 0822   MCV 94 08/23/2023 0822   MCH 31.9 08/23/2023 0822   MCH 30.2 02/07/2023 1525   MCHC 34.0 08/23/2023 0822   MCHC 33.5 02/07/2023 1525   RDW 13.0 08/23/2023 0822   LYMPHSABS 1.4 08/23/2023 0822   MONOABS 936 08/01/2016 1536   EOSABS 0.3 08/23/2023 0822   BASOSABS 0.0 08/23/2023 0822    Hepatic Function      Component Value Date/Time   PROT 6.7 08/23/2023 0822   ALBUMIN 4.3 08/23/2023 0822   AST 19 08/23/2023 0822   ALT 21 08/23/2023 0822   ALKPHOS 79 08/23/2023 0822   BILITOT 0.4 08/23/2023 0822   BILIDIR 0.1 06/03/2018 1408   IBILI 0.2 06/03/2018 1408    Renal Function   Lab Results  Component Value Date   CREATININE 1.10 04/01/2024   CREATININE 1.08 08/23/2023   CREATININE 1.05 02/07/2023    Estimated Creatinine Clearance: 77.4 mL/min (by C-G formula based on SCr of 1.1 mg/dL).   VVS Vascular Lab Studies:  03/27/2024 VAS US  IVC/ILIAC (VENOUS ONLY) Summary:  IVC/Iliac: LImited visualization of the IVC and proximal common iliac vein  appear patent and without thrombus.  Acute and occlusive DVT involving the mid and distal external iliac vein.   03/18/2024 US  Venous Img Lower Bilateral:  FINDINGS: RIGHT LOWER EXTREMITY Common Femoral Vein: Hypoechoic intraluminal thrombus. Vessel is noncompressible. Thrombus appears occlusive.   Saphenofemoral Junction: No evidence of thrombus. Normal compressibility and flow on color Doppler imaging.   Profunda Femoral Vein: Thrombus appears to extend into  the profunda femoral vein. Vessel noncompressible.   Femoral Vein: Diffuse hypoechoic thrombus throughout. Vein is noncompressible. Thrombus appears occlusive.   Popliteal Vein: Diffuse  hypoechoic thrombus also noted. Vein is noncompressible. Thrombus nearly occlusive.   Calf Veins: Limited assessment of the calf veins. Thrombus suspected in the posterior tibial veins appearing noncompressible and occlusive. Peroneal vein not visualized.   LEFT LOWER EXTREMITY Common Femoral Vein: No evidence of thrombus. Normal compressibility, respiratory phasicity and response to augmentation.   Saphenofemoral Junction: No evidence of thrombus. Normal compressibility and flow on color Doppler imaging.   Profunda Femoral Vein: No evidence of thrombus. Normal compressibility and flow on color Doppler imaging.   Femoral Vein: No evidence of thrombus. Normal compressibility, respiratory phasicity and response to augmentation.   Popliteal Vein: No evidence of thrombus. Normal compressibility, respiratory phasicity and response to augmentation.   Calf Veins: Limited assessment of the calf veins. Posterior tibial vein appears patent. Peroneal vein not visualized.   IMPRESSION: 1. Positive exam for extensive right lower extremity DVT from the common femoral vein through the calf veins. 2. No significant left lower extremity DVT.  ASSESSMENT:    Patient with prior history of LLE DVT now diagnosed with extensive, occlusive DVT involving the right external iliac, common femoral, femoral, popliteal, and posterior tibial veins. He was appropriately started on Eliquis  VTE dosing. He has significant swelling extending up to his groin. Given DVT extends to the external iliac vein discussed with Dr. Pearline who came to evaluate the patient. Per Dr. Pearline plan for percutaneous mechanical thrombectomy next Wednesday with Dr. Lanis due to patient preference to avoid scheduling earlier next week out of concern for winter weather. No clear provoking factors identified. Given this is his second unprovoked DVT, would recommend indefinite anticoagulation. Discussed referral to hematology for  hypercoagulable work up although this likely would not change management. Patient preferred to wait on this given the stress of the situation and caring for his wife. Counseled that he could discuss further with his PCP which he said he would do. Extensively counseled on Eliquis  and all of his questions were answered. No medication adherence barriers identified. Counseled to wear compression stockings and elevate his legs to help with swelling.  PLAN: -Continue apixaban  (Eliquis ) 5 mg twice daily. -Expected duration of therapy: Indefinite. Therapy started on 03/17/2024. -Patient educated on purpose, proper use and potential adverse effects of apixaban  (Eliquis ). -Discussed importance of taking medication around the same time every day. -Advised patient of medications to avoid (NSAIDs, aspirin doses >100 mg daily). -Educated that Tylenol  (acetaminophen ) is the preferred analgesic to lower the risk of bleeding. -Advised patient to alert all providers of anticoagulation therapy prior to starting a new medication or having a procedure. -Emphasized importance of monitoring for signs and symptoms of bleeding (abnormal bruising, prolonged bleeding, nose bleeds, bleeding from gums, discolored urine, black tarry stools). -Educated patient to present to the ED if emergent signs and symptoms of new thrombosis occur. -Counseled patient to wear compression stockings daily, removing at night.  Follow up: Follow up after thrombectomy  Izetta Henry, PharmD, CPP [facility] [phone]

## 2024-04-02 ENCOUNTER — Encounter (HOSPITAL_COMMUNITY): Payer: Self-pay | Admitting: Vascular Surgery

## 2024-04-08 ENCOUNTER — Telehealth: Payer: Self-pay | Admitting: Pharmacist

## 2024-04-08 NOTE — Telephone Encounter (Signed)
 Received a call from patient. He was wondering if it is okay for him to take glucosamine chondroitin while taking Eliquis . Reports he was taking this for knee pain prior to starting on Eliquis  and it really helped. Also asked if it would be better to take Advil instead, but he read that this interacts with Eliquis . Counseled that there is a potential for increased risk of bleeding when Eliquis  and glucosamine chondroitin are co administered, but there is limited documentation of this interaction. Counseled that it would be okay to try taking these together, but he should continue to monitor for signs of bleeding. Counseled to avoid NSAIDs. He confirmed understanding and all of his questions were answered.

## 2024-05-13 ENCOUNTER — Encounter

## 2024-05-13 ENCOUNTER — Ambulatory Visit (HOSPITAL_COMMUNITY)

## 2024-08-25 ENCOUNTER — Other Ambulatory Visit

## 2024-09-01 ENCOUNTER — Encounter: Admitting: Family Medicine
